# Patient Record
Sex: Male | Born: 1945 | Race: White | Hispanic: No | Smoking: Former smoker
Health system: Southern US, Community
[De-identification: ages and names within clinical notes are randomized; demographics above are authoritative.]

## PROBLEM LIST (undated history)

## (undated) DIAGNOSIS — B019 Varicella without complication: Secondary | ICD-10-CM

## (undated) DIAGNOSIS — M1990 Unspecified osteoarthritis, unspecified site: Secondary | ICD-10-CM

## (undated) DIAGNOSIS — Z87442 Personal history of urinary calculi: Secondary | ICD-10-CM

## (undated) DIAGNOSIS — E785 Hyperlipidemia, unspecified: Secondary | ICD-10-CM

## (undated) DIAGNOSIS — I2510 Atherosclerotic heart disease of native coronary artery without angina pectoris: Secondary | ICD-10-CM

## (undated) DIAGNOSIS — Z860100 Personal history of colon polyps, unspecified: Secondary | ICD-10-CM

## (undated) DIAGNOSIS — R519 Headache, unspecified: Secondary | ICD-10-CM

## (undated) DIAGNOSIS — E119 Type 2 diabetes mellitus without complications: Secondary | ICD-10-CM

## (undated) DIAGNOSIS — R51 Headache: Secondary | ICD-10-CM

## (undated) DIAGNOSIS — I519 Heart disease, unspecified: Secondary | ICD-10-CM

## (undated) DIAGNOSIS — I10 Essential (primary) hypertension: Secondary | ICD-10-CM

## (undated) HISTORY — DX: Varicella without complication: B01.9

## (undated) HISTORY — DX: Hyperlipidemia, unspecified: E78.5

## (undated) HISTORY — DX: Heart disease, unspecified: I51.9

## (undated) HISTORY — DX: Personal history of colonic polyps: Z86.010

## (undated) HISTORY — DX: Essential (primary) hypertension: I10

## (undated) HISTORY — DX: Headache, unspecified: R51.9

## (undated) HISTORY — DX: Unspecified osteoarthritis, unspecified site: M19.90

## (undated) HISTORY — PX: TONSILLECTOMY: SUR1361

## (undated) HISTORY — PX: CYST REMOVAL HAND: SHX6279

## (undated) HISTORY — DX: Headache: R51

## (undated) HISTORY — DX: Personal history of urinary calculi: Z87.442

## (undated) HISTORY — DX: Personal history of colon polyps, unspecified: Z86.0100

---

## 2012-05-16 HISTORY — PX: CARDIAC SURGERY: SHX584

## 2013-03-26 ENCOUNTER — Encounter (HOSPITAL_COMMUNITY): Payer: Self-pay | Admitting: Emergency Medicine

## 2013-03-26 ENCOUNTER — Emergency Department (HOSPITAL_COMMUNITY)
Admission: EM | Admit: 2013-03-26 | Discharge: 2013-03-26 | Disposition: A | Discharge status: Home / Self Care | Payer: Medicare HMO | Attending: Emergency Medicine | Admitting: Emergency Medicine

## 2013-03-26 ENCOUNTER — Emergency Department (HOSPITAL_COMMUNITY): Payer: Medicare HMO

## 2013-03-26 DIAGNOSIS — R5383 Other fatigue: Secondary | ICD-10-CM | POA: Insufficient documentation

## 2013-03-26 DIAGNOSIS — R5381 Other malaise: Secondary | ICD-10-CM | POA: Insufficient documentation

## 2013-03-26 DIAGNOSIS — R55 Syncope and collapse: Secondary | ICD-10-CM | POA: Insufficient documentation

## 2013-03-26 DIAGNOSIS — Z87891 Personal history of nicotine dependence: Secondary | ICD-10-CM | POA: Insufficient documentation

## 2013-03-26 DIAGNOSIS — Z951 Presence of aortocoronary bypass graft: Secondary | ICD-10-CM | POA: Insufficient documentation

## 2013-03-26 DIAGNOSIS — R42 Dizziness and giddiness: Secondary | ICD-10-CM | POA: Insufficient documentation

## 2013-03-26 LAB — CBC
HCT: 45.1 % (ref 39.0–52.0)
Hemoglobin: 14.8 g/dL (ref 13.0–17.0)
MCH: 29.7 pg (ref 26.0–34.0)
MCHC: 32.8 g/dL (ref 30.0–36.0)
Platelets: 259 10*3/uL (ref 150–400)
RBC: 4.98 MIL/uL (ref 4.22–5.81)
RDW: 13.8 % (ref 11.5–15.5)
WBC: 8.5 10*3/uL (ref 4.0–10.5)

## 2013-03-26 LAB — URINALYSIS, ROUTINE W REFLEX MICROSCOPIC
Bilirubin Urine: NEGATIVE
Hgb urine dipstick: NEGATIVE
Nitrite: NEGATIVE
Protein, ur: NEGATIVE mg/dL
Specific Gravity, Urine: 1.009 (ref 1.005–1.030)
Urobilinogen, UA: 0.2 mg/dL (ref 0.0–1.0)
pH: 7 (ref 5.0–8.0)

## 2013-03-26 LAB — BASIC METABOLIC PANEL
BUN: 9 mg/dL (ref 6–23)
Calcium: 10.5 mg/dL (ref 8.4–10.5)
Chloride: 99 mEq/L (ref 96–112)
GFR calc Af Amer: >90 mL/min (ref >90)
GFR calc non Af Amer: 86 mL/min — ABNORMAL LOW (ref >90)
Glucose, Bld: 103 mg/dL — ABNORMAL HIGH (ref 70–99)
Potassium: 4.4 mEq/L (ref 3.5–5.1)
Sodium: 136 mEq/L (ref 135–145)

## 2013-03-26 LAB — POCT I-STAT TROPONIN I

## 2013-03-26 MED ORDER — SODIUM CHLORIDE 0.9 % IV BOLUS (SEPSIS)
500.0000 mL | Freq: Once | INTRAVENOUS | Status: AC
  Administered 2013-03-26: 500 mL via INTRAVENOUS

## 2013-03-26 NOTE — Progress Notes (Signed)
   CARE MANAGEMENT ED NOTE 03/26/2013  Patient:  Hector Miller, Hector Miller   Account Number:  [id number]  Date Initiated:  03/26/2013  Documentation initiated by:  Edd Arbour  Subjective/Objective Assessment:   68 [employer] hmo pt without pcp listed in EPIC     Subjective/Objective Assessment Detail:     Action/Plan:   spoke with pt who confirms he sees larry smith and is seen at [facility] Epic updated   Action/Plan Detail:   Anticipated DC Date:  03/26/2013     Status Recommendation to Physician:   Result of Recommendation:    Other ED Services  Consult Working Plan    DC Planning Services  Outpatient Services - Pt will follow up  Other  PCP issues    Choice offered to / List presented to:            Status of service:  Completed, signed off  ED Comments:   ED Comments Detail:

## 2013-03-26 NOTE — ED Notes (Addendum)
Patient reports that he was driving and began to feel dizzy and have weakness. Patient denies N/V/D or any chest pain. Patient reports that he has had a similr episode, but did not go tot he doctor. Patient states he is having dizziness at rest.

## 2013-03-26 NOTE — ED Notes (Signed)
Patient transported to X-ray 

## 2013-03-26 NOTE — ED Provider Notes (Signed)
CSN: [id number]     Arrival date & time 03/26/13  0901 History   First MD Initiated Contact with Patient 03/26/13 [phone]     Chief Complaint  Patient presents with  . Dizziness  . Weakness   (Consider location/radiation/quality/duration/timing/severity/associated sxs/prior Treatment) HPI Comments: States he is "not feeling good" and some dizziness that began this morning. Denies any recent illness, antibiotic use. Denies any recent medication or diet changes. He did take some indomethacin for some doubt as he believes his foot is having a gout flareup at this time. Took some omeprazole for indigestion this morning.  Patient is a 67 y.o. male presenting with weakness. The history is provided by the patient.  Weakness This is a recurrent problem. The current episode started 3 to 5 hours ago. The problem occurs constantly. The problem has been gradually improving. Pertinent negatives include no chest pain, no abdominal pain and no shortness of breath. Nothing aggravates the symptoms. Nothing relieves the symptoms. He has tried nothing for the symptoms.    History reviewed. No pertinent past medical history. Past Surgical History  Procedure Laterality Date  . Cardiac surgery    . Tonsillectomy     Family History  Problem Relation Age of Onset  . Cancer Mother   . Cancer Father   . Heart failure Father   . Stroke Father    History  Substance Use Topics  . Smoking status: Former Smoker    Types: Cigarettes, Cigars, Pipe    Quit date: 03/26/1970  . Smokeless tobacco: Former Neurosurgeon  . Alcohol Use: Yes     Comment: daily-5-6 shots of liquor    Review of Systems  Constitutional: Negative for fever and chills.  Respiratory: Negative for cough and shortness of breath.   Cardiovascular: Negative for chest pain, palpitations and leg swelling.  Gastrointestinal: Negative for nausea, vomiting and abdominal pain.  Neurological: Positive for syncope (near-syncope) and weakness.  All other  systems reviewed and are negative.    Allergies  Review of patient's allergies indicates not on file.  Home Medications  No current outpatient prescriptions on file. BP 168/89  Pulse 87  Temp(Src) 97.4 F (36.3 C) (Oral)  Resp 20  SpO2 97% Physical Exam  Nursing note and vitals reviewed. Constitutional: He is oriented to person, place, and time. He appears well-developed and well-nourished. No distress.  HENT:  Head: Normocephalic and atraumatic.  Right Ear: Tympanic membrane normal.  Left Ear: Tympanic membrane normal.  Mouth/Throat: No oropharyngeal exudate.  Eyes: EOM are normal. Pupils are equal, round, and reactive to light.  Neck: Normal range of motion. Neck supple.  Cardiovascular: Normal rate and regular rhythm.  Exam reveals no friction rub.   No murmur heard. Pulmonary/Chest: Effort normal and breath sounds normal. No respiratory distress. He has no wheezes. He has no rales.  Abdominal: He exhibits no distension. There is no tenderness. There is no rebound.  Musculoskeletal: Normal range of motion. He exhibits no edema.  Neurological: He is alert and oriented to person, place, and time. No cranial nerve deficit. He exhibits normal muscle tone. Coordination and gait normal. GCS eye subscore is 4. GCS verbal subscore is 5. GCS motor subscore is 6.  Skin: He is not diaphoretic.    ED Course  Procedures (including critical care time) Labs Review Labs Reviewed  CBC  BASIC METABOLIC PANEL  URINALYSIS, ROUTINE W REFLEX MICROSCOPIC  LACTIC ACID, PLASMA   Imaging Review Dg Chest 2 View  03/26/2013   CLINICAL DATA:  Dizziness.  EXAM: CHEST  2 VIEW  COMPARISON:  None.  FINDINGS: Heart size and mediastinal contours are within normal limits. Both lungs are clear. Visualized skeletal structures are unremarkable. The patient is status post CABG.  IMPRESSION: No acute disease.   Electronically Signed   By: Drusilla Kanner M.D.   On: 03/26/2013 10:32    EKG Interpretation      Ventricular Rate:  88 PR Interval:  226 QRS Duration: 83 QT Interval:  362 QTC Calculation: 438 R Axis:   63 Text Interpretation:  Sinus rhythm Prolonged PR interval Probable left atrial enlargement Low voltage, precordial leads No previous EKG            MDM   1. Dizziness    67 year old male presents with dizziness. States began this morning. His been constant, however he states it is improving at this time. No alleviating or exacerbating factors. He states he has some associated presyncope. He had no true syncopal events. When asked if he had this before coming started as he had a cardiac bypass, however he did not specifically say that he had these symptoms related to his heart. He denies any chest pain, shortness of breath, fever, cough. Denies any nausea, vomiting, diarrhea, abdominal pain. ECT was just doesn't feel well. Will do broad-based workup including troponin, EKG, chest x-ray, urine, basic labs. He is not ataxic he is not having neurologic symptoms. I do not feel like this is consistent with a central dizziness as his gait is normal and he has no nausea or vomiting. He has normal cranial nerves and has no vision complaints. Labs normal here. EKG normal here. Serial troponins normal. I do not feel his symptoms are c/w a stroke. Patient states he is feeling better on re-exam and would like to go home. I feel this is reasonable at this time. Instructed to f/u with his PCP as soon as he returns home to [place].   Dagmar Hait, MD 03/26/13 [phone]

## 2014-10-19 DIAGNOSIS — Z7982 Long term (current) use of aspirin: Secondary | ICD-10-CM | POA: Insufficient documentation

## 2014-10-19 DIAGNOSIS — Z79899 Other long term (current) drug therapy: Secondary | ICD-10-CM | POA: Diagnosis not present

## 2014-10-19 DIAGNOSIS — M25472 Effusion, left ankle: Secondary | ICD-10-CM | POA: Diagnosis not present

## 2014-10-19 DIAGNOSIS — M25562 Pain in left knee: Secondary | ICD-10-CM | POA: Diagnosis present

## 2014-10-19 DIAGNOSIS — Z87891 Personal history of nicotine dependence: Secondary | ICD-10-CM | POA: Insufficient documentation

## 2014-10-19 NOTE — ED Notes (Signed)
Patient reports left knee pain since Wednesday.  Patient denies any type of injury.

## 2014-10-20 ENCOUNTER — Emergency Department
Admission: EM | Admit: 2014-10-20 | Discharge: 2014-10-20 | Disposition: A | Discharge status: Home / Self Care | Payer: Medicare PPO | Attending: Emergency Medicine | Admitting: Emergency Medicine

## 2014-10-20 ENCOUNTER — Emergency Department: Payer: Medicare PPO

## 2014-10-20 DIAGNOSIS — M25562 Pain in left knee: Secondary | ICD-10-CM

## 2014-10-20 MED ORDER — COLCHICINE 0.6 MG PO TABS
0.6000 mg | ORAL_TABLET | Freq: Once | ORAL | Status: AC
  Administered 2014-10-20: 0.6 mg via ORAL

## 2014-10-20 MED ORDER — COLCHICINE 0.6 MG PO TABS
ORAL_TABLET | ORAL | Status: AC
  Administered 2014-10-20: 0.6 mg via ORAL
  Filled 2014-10-20: qty 1

## 2014-10-20 NOTE — ED Provider Notes (Signed)
[facility] Emergency Department Provider Note  ____________________________________________  Time seen: 2:15 AM  I have reviewed the triage vital signs and the nursing notes.   HISTORY  Chief Complaint Knee Pain    HPI Hector Miller is a 69 y.o. male who complains of left knee pain for 5 days. He also reports mild swelling of the left ankle. He has a history of gout which feels similar. He has not tried anything for the pain at home yet. No history of DVT. No chest pain or shortness of breath, fever chills nausea vomiting diarrhea dizziness lightheadedness fatigue or syncope. No recent trauma slipped or falls. The pain is sharp and worse with joint movement, and worse with palpation of the joint line laterally.     No past medical history on file. Gout GERD There are no active problems to display for this patient.   Past Surgical History  Procedure Laterality Date  . Cardiac surgery    . Tonsillectomy      Current Outpatient Rx  Name  Route  Sig  Dispense  Refill  . aspirin 81 MG tablet   Oral   Take 81 mg by mouth daily.         . cholecalciferol (VITAMIN D) 1000 UNITS tablet   Oral   Take 1,000 Units by mouth daily.         . fish oil-omega-3 fatty acids 1000 MG capsule   Oral   Take 2 g by mouth daily.         . indomethacin (INDOCIN) 25 MG capsule   Oral   Take 25 mg by mouth 2 (two) times daily with a meal.         . metoprolol succinate (TOPROL-XL) 25 MG 24 hr tablet   Oral   Take 12.5 mg by mouth daily.         Marland Kitchen. omeprazole (PRILOSEC) 20 MG capsule   Oral   Take 40 mg by mouth 2 (two) times daily.           Allergies Review of patient's allergies indicates no known allergies.  Family History  Problem Relation Age of Onset  . Cancer Mother   . Cancer Father   . Heart failure Father   . Stroke Father     Social History History  Substance Use Topics  . Smoking status: Former Smoker    Types:  Cigarettes, Cigars, Pipe    Quit date: 03/26/1970  . Smokeless tobacco: Former NeurosurgeonUser  . Alcohol Use: Yes     Comment: daily-5-6 shots of liquor    Review of Systems  Constitutional: No fever or chills. No weight changes Eyes:No blurry vision or double vision.  ENT: No sore throat. Cardiovascular: No chest pain. Respiratory: No dyspnea or cough. Gastrointestinal: Negative for abdominal pain, vomiting and diarrhea.  No BRBPR or melena. Genitourinary: Negative for dysuria, urinary retention, bloody urine, or difficulty urinating. Musculoskeletal: Left knee pain Skin: Negative for rash. Neurological: Negative for headaches, focal weakness or numbness. Psychiatric:No anxiety or depression.   Endocrine:No hot/cold intolerance, changes in energy, or sleep difficulty.  10-point ROS otherwise negative.  ____________________________________________   PHYSICAL EXAM:  VITAL SIGNS: ED Triage Vitals  Enc Vitals Group     BP 10/19/14 2359 139/81 mmHg     Pulse Rate 10/19/14 2359 69     Resp 10/19/14 2359 20     Temp 10/19/14 2359 98.4 F (36.9 C)     Temp Source 10/19/14 2359 Oral  SpO2 10/19/14 2359 96 %     Weight 10/20/14 0001 235 lb (106.595 kg)     Height 10/19/14 2359 6' (1.829 m)     Head Cir --      Peak Flow --      Pain Score 10/20/14 0001 10     Pain Loc --      Pain Edu? --      Excl. in GC? --      Constitutional: Alert and oriented. Well appearing and in no distress. Eyes: No scleral icterus. No conjunctival pallor. PERRL. EOMI ENT   Head: Normocephalic and atraumatic. Musculoskeletal: Focal point tenderness at the left lateral joint line. Ligaments are all stable. There is no joint effusion. There is no erythema induration drainage or warmth. There is trace edema of the left lower extremity with normal peripheral pulses. There is no calf tenderness negative Homans sign.  Neurologic:   Normal speech and language.  CN 2-10 normal. Motor grossly  intact. No pronator drift.  Normal gait. No gross focal neurologic deficits are appreciated.  Skin:  Skin is warm, dry and intact. No rash noted.  No petechiae, purpura, or bullae. Psychiatric: Mood and affect are normal. Speech and behavior are normal. Patient exhibits appropriate insight and judgment.  ____________________________________________    LABS (pertinent positives/negatives) (all labs ordered are listed, but only abnormal results are displayed) Labs Reviewed - No data to display ____________________________________________   EKG    ____________________________________________    RADIOLOGY  X-ray left knee unremarkable  ____________________________________________   PROCEDURES  ____________________________________________   INITIAL IMPRESSION / ASSESSMENT AND PLAN / ED COURSE  Pertinent labs & imaging results that were available during my care of the patient were reviewed by me and considered in my medical decision making (see chart for details).  Patient presents with left knee pain likely arthritis in nature. May be related to his history of gout. We'll give him a trial of culture seen and send him home with instructions to take NSAIDs. No evidence of DVT cellulitis next testing fasciitis or abscess. No septic arthritis.  ____________________________________________   FINAL CLINICAL IMPRESSION(S) / ED DIAGNOSES  Final diagnoses:  Left knee pain      Sharman Cheek, MD 10/20/14 [phone]

## 2014-10-20 NOTE — Discharge Instructions (Signed)
Arthritis, Nonspecific °Arthritis is inflammation of a joint. This usually means pain, redness, warmth or swelling are present. One or more joints may be involved. There are a number of types of arthritis. Your caregiver may not be able to tell what type of arthritis you have right away. °CAUSES  °The most common cause of arthritis is the wear and tear on the joint (osteoarthritis). This causes damage to the cartilage, which can break down over time. The knees, hips, back and neck are most often affected by this type of arthritis. °Other types of arthritis and common causes of joint pain include: °· Sprains and other injuries near the joint. Sometimes minor sprains and injuries cause pain and swelling that develop hours later. °· Rheumatoid arthritis. This affects hands, feet and knees. It usually affects both sides of your body at the same time. It is often associated with chronic ailments, fever, weight loss and general weakness. °· Crystal arthritis. Gout and pseudo gout can cause occasional acute severe pain, redness and swelling in the foot, ankle, or knee. °· Infectious arthritis. Bacteria can get into a joint through a break in overlying skin. This can cause infection of the joint. Bacteria and viruses can also spread through the blood and affect your joints. °· Drug, infectious and allergy reactions. Sometimes joints can become mildly painful and slightly swollen with these types of illnesses. °SYMPTOMS  °· Pain is the main symptom. °· Your joint or joints can also be red, swollen and warm or hot to the touch. °· You may have a fever with certain types of arthritis, or even feel overall ill. °· The joint with arthritis will hurt with movement. Stiffness is present with some types of arthritis. °DIAGNOSIS  °Your caregiver will suspect arthritis based on your description of your symptoms and on your exam. Testing may be needed to find the type of arthritis: °· Blood and sometimes urine tests. °· X-ray tests  and sometimes CT or MRI scans. °· Removal of fluid from the joint (arthrocentesis) is done to check for bacteria, crystals or other causes. Your caregiver (or a specialist) will numb the area over the joint with a local anesthetic, and use a needle to remove joint fluid for examination. This procedure is only minimally uncomfortable. °· Even with these tests, your caregiver may not be able to tell what kind of arthritis you have. Consultation with a specialist (rheumatologist) may be helpful. °TREATMENT  °Your caregiver will discuss with you treatment specific to your type of arthritis. If the specific type cannot be determined, then the following general recommendations may apply. °Treatment of severe joint pain includes: °· Rest. °· Elevation. °· Anti-inflammatory medication (for example, ibuprofen) may be prescribed. Avoiding activities that cause increased pain. °· Only take over-the-counter or prescription medicines for pain and discomfort as recommended by your caregiver. °· Cold packs over an inflamed joint may be used for 10 to 15 minutes every hour. Hot packs sometimes feel better, but do not use overnight. Do not use hot packs if you are diabetic without your caregiver's permission. °· A cortisone shot into arthritic joints may help reduce pain and swelling. °· Any acute arthritis that gets worse over the next 1 to 2 days needs to be looked at to be sure there is no joint infection. °Long-term arthritis treatment involves modifying activities and lifestyle to reduce joint stress jarring. This can include weight loss. Also, exercise is needed to nourish the joint cartilage and remove waste. This helps keep the muscles   around the joint strong. °HOME CARE INSTRUCTIONS  °· Do not take aspirin to relieve pain if gout is suspected. This elevates uric acid levels. °· Only take over-the-counter or prescription medicines for pain, discomfort or fever as directed by your caregiver. °· Rest the joint as much as  possible. °· If your joint is swollen, keep it elevated. °· Use crutches if the painful joint is in your leg. °· Drinking plenty of fluids may help for certain types of arthritis. °· Follow your caregiver's dietary instructions. °· Try low-impact exercise such as: °¨ Swimming. °¨ Water aerobics. °¨ Biking. °¨ Walking. °· Morning stiffness is often relieved by a warm shower. °· Put your joints through regular range-of-motion. °SEEK MEDICAL CARE IF:  °· You do not feel better in 24 hours or are getting worse. °· You have side effects to medications, or are not getting better with treatment. °SEEK IMMEDIATE MEDICAL CARE IF:  °· You have a fever. °· You develop severe joint pain, swelling or redness. °· Many joints are involved and become painful and swollen. °· There is severe back pain and/or leg weakness. °· You have loss of bowel or bladder control. °Document Released: 06/09/2004 Document Revised: 07/25/2011 Document Reviewed: 06/25/2008 °[employer]® Patient Information ©2015 [employer]. This information is not intended to replace advice given to you by your health care provider. Make sure you discuss any questions you have with your health care provider. ° °

## 2014-10-20 NOTE — ED Notes (Signed)
Pt went to [employer] officer and states his pain has increased. RN brought pt back to room and reassessed. Xray ordered, pt offered tylenol, motrin, stated "they don't do nothing."

## 2014-11-28 ENCOUNTER — Ambulatory Visit: Payer: Medicare HMO | Admitting: Internal Medicine

## 2014-12-02 ENCOUNTER — Encounter (INDEPENDENT_AMBULATORY_CARE_PROVIDER_SITE_OTHER): Payer: Self-pay

## 2014-12-02 ENCOUNTER — Ambulatory Visit (INDEPENDENT_AMBULATORY_CARE_PROVIDER_SITE_OTHER): Payer: Medicare PPO | Admitting: Internal Medicine

## 2014-12-02 ENCOUNTER — Encounter: Payer: Self-pay | Admitting: Internal Medicine

## 2014-12-02 VITALS — BP 130/82 | HR 68 | Temp 98.3°F | Ht 72.25 in | Wt 240.0 lb

## 2014-12-02 DIAGNOSIS — I10 Essential (primary) hypertension: Secondary | ICD-10-CM | POA: Diagnosis not present

## 2014-12-02 DIAGNOSIS — E785 Hyperlipidemia, unspecified: Secondary | ICD-10-CM

## 2014-12-02 DIAGNOSIS — M479 Spondylosis, unspecified: Secondary | ICD-10-CM

## 2014-12-02 DIAGNOSIS — K219 Gastro-esophageal reflux disease without esophagitis: Secondary | ICD-10-CM | POA: Diagnosis not present

## 2014-12-02 DIAGNOSIS — M109 Gout, unspecified: Secondary | ICD-10-CM | POA: Diagnosis not present

## 2014-12-02 DIAGNOSIS — R51 Headache: Secondary | ICD-10-CM

## 2014-12-02 DIAGNOSIS — R519 Headache, unspecified: Secondary | ICD-10-CM | POA: Insufficient documentation

## 2014-12-02 DIAGNOSIS — N400 Enlarged prostate without lower urinary tract symptoms: Secondary | ICD-10-CM

## 2014-12-02 MED ORDER — PANTOPRAZOLE SODIUM 40 MG PO TBEC: 40.0000 mg | DELAYED_RELEASE_TABLET | Freq: Every day | ORAL | Status: DC

## 2014-12-02 NOTE — Assessment & Plan Note (Signed)
I think these are tension Advised him to keep a headache diary and bring back to his next visit Consider referral to neurology

## 2014-12-02 NOTE — Progress Notes (Signed)
Pre visit review using our clinic review tool, if applicable. No additional management support is needed unless otherwise documented below in the visit note. 

## 2014-12-02 NOTE — Assessment & Plan Note (Signed)
Discussed foods to avoid, that increase your risk for gout Will check CMET at next visit Continue Allopurinol at this time

## 2014-12-02 NOTE — Assessment & Plan Note (Signed)
Well controlled on current meds Will request previous records and ECG from PCP Will check CBC and CMET and next visit Consider cutting back on his Lasix at his next visit Will continue current meds at this time

## 2014-12-02 NOTE — Assessment & Plan Note (Signed)
Voids fine on Flomax

## 2014-12-02 NOTE — Progress Notes (Signed)
HPI  Pt presents to the clinic today to establish care and for management of the conditions listed below. He has been being seen at the [facility] in [place], [place] and [place], but reports he has never had a regular PCP.  Gout: His last gout attack was 1 month ago in his left knee. He takes Allopurinol daily.  HLD: S/P CABG and valve replacement in 2014 at [facility]. He takes Lipitor as prescribed. He also takes a ASA daily. He denies myalgias. He denies chest pain, chest tightness or shortness of breath.  HTN: He takes Lisinopril, Toprol and Lasix for his BP. He reports he is not really sure why he takes Lasix. He has no previous history of swelling or heart failure that he is aware of.  GERD: He takes Prilosec daily. He thinks it helps his reflux but he reports he does have occassional nausea.  BPH: He urinates well on Flomax.  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 He takes Aleve as needed.  He is concerned about frequent headaches and nausea. This has been going on for  "years". He has been evaluated by multiple doctors and "no one can figure out what is wrong with me". The headache is "all over". He cannot describe the pain but reports it just hurts. He denies sensitivity to light and sound. He does have nausea but denies vomiting. He reports he thinks he would feel better if he could just throw up. The nausea is intermittent. It seems to be worse after drinking coffee. He has had a colonoscopy in the past and many upper GI's.  He is having normal bowel movements. He denies blood in his stool.  Flu: never Tetanus: unsure of his last one Pneumovax: never Prevnar: never Zostovax: never Colonoscopy: in 2015 at [place] PSA screening: he thinks he gets this yearly Vision Screening: yearly  Dentist: as needed  Past Medical History  Diagnosis Date  . Arthritis   . Chicken pox   . Heart disease   . Frequent headaches   . Hypertension   . Hyperlipidemia   . History of colon polyps   . History of  kidney stones     Current Outpatient Prescriptions  Medication Sig Dispense Refill  . allopurinol (ZYLOPRIM) 300 MG tablet Take 300 mg by mouth daily.    Marland Kitchen aspirin 325 MG tablet Take 325 mg by mouth at bedtime and may repeat dose one time if needed.    Marland Kitchen atorvastatin (LIPITOR) 80 MG tablet Take 80 mg by mouth daily.    . Cholecalciferol (VITAMIN D3) 2000 UNITS TABS Take 1 tablet by mouth daily.    . furosemide (LASIX) 20 MG tablet Take 20 mg by mouth.    Marland Kitchen lisinopril (PRINIVIL,ZESTRIL) 20 MG tablet Take 20 mg by mouth daily.    . metoprolol succinate (TOPROL-XL) 25 MG 24 hr tablet Take 25 mg by mouth daily.     . Omega-3 350 MG CPDR Take 1 capsule by mouth 2 (two) times daily.    Marland Kitchen omeprazole (PRILOSEC) 20 MG capsule Take 40 mg by mouth 2 (two) times daily.    . tamsulosin (FLOMAX) 0.4 MG CAPS capsule Take 0.4 mg by mouth daily after supper.     No current facility-administered medications for this visit.    No Known Allergies  Family History  Problem Relation Age of Onset  . Cancer Mother     Breast  . Parkinson's disease Mother   . Heart failure Father   . Cancer Father  Stomach  . Stroke Father   . Hypertension Father     History   Social History  . Marital Status: Divorced    Spouse Name: N/A  . Number of Children: N/A  . Years of Education: N/A   Occupational History  . Not on file.   Social History Main Topics  . Smoking status: Former Smoker    Types: Cigarettes, Cigars, Pipe    Quit date: 03/26/1970  . Smokeless tobacco: Former NeurosurgeonUser  . Alcohol Use: 0.0 oz/week    0 Standard drinks or equivalent per week     Comment: daily-5-6 shots of liquor  . Drug Use: No  . Sexual Activity: Not on file   Other Topics Concern  . Not on file   Social History Narrative    ROS:  Constitutional: Pt reports headaches. Denies fever, malaise, fatigue, or abrupt weight changes.  HEENT: Denies eye pain, eye redness, ear pain, ringing in the ears, wax buildup, runny  nose, nasal congestion, bloody nose, or sore throat. Respiratory: Denies difficulty breathing, shortness of breath, cough or sputum production.   Cardiovascular: Denies chest pain, chest tightness, palpitations or swelling in the hands or feet.  Gastrointestinal: Pt reports nausea. Denies abdominal pain, bloating, constipation, diarrhea or blood in the stool.  GU: Denies frequency, urgency, pain with urination, blood in urine, odor or discharge. Musculoskeletal: Pt reports back pain. Denies decrease in range of motion, difficulty with gait, muscle pain or joint swelling.  Skin: Denies redness, rashes, lesions or ulcercations.  Neurological: Denies dizziness, difficulty with memory, difficulty with speech or problems with balance and coordination.  Psych: Denies anxiety, depression, SI/HI.  No other specific complaints in a complete review of systems (except as listed in HPI above).  PE:  BP 130/82 mmHg  Pulse 68  Temp(Src) 98.3 F (36.8 C) (Oral)  Ht 6' 0.25" (1.835 m)  Wt 240 lb (108.863 kg)  BMI 32.33 kg/m2  SpO2 97% Wt Readings from Last 3 Encounters:  12/02/14 240 lb (108.863 kg)  10/20/14 235 lb (106.595 kg)    General: Appears his stated age, obese in NAD. HEENT: Head: normal shape and size; Eyes: sclera white, no icterus, conjunctiva pink, PERRLA and EOMs intact;  Neck: Neck supple, trachea midline. No masses, lumps or thyromegaly present.  Cardiovascular: Normal rate and rhythm. S1,S2 noted.  No murmur, rubs or gallops noted. Trace BLE edema. No carotid bruits noted. Pulmonary/Chest: Normal effort and positive vesicular breath sounds. No respiratory distress. No wheezes, rales or ronchi noted.  Abdomen: Soft and nontender. Normal bowel sounds, no bruits noted. No distention or masses noted. Liver, spleen and kidneys non palpable. Musculoskeletal: Decreased flexion of the spine. Normal extension and rotation of the spine. Mild pain with palpation over the lumbar spine. No  difficulty with gait.  Neurological: Alert and oriented.  Psychiatric: Mood and affect normal. Behavior is normal. Judgment and thought content normal.    BMET    Component Value Date/Time   NA 136 03/26/2013 1055   K 4.4 03/26/2013 1055   CL 99 03/26/2013 1055   CO2 25 03/26/2013 1055   GLUCOSE 103* 03/26/2013 1055   BUN 9 03/26/2013 1055   CREATININE 0.88 03/26/2013 1055   CALCIUM 10.5 03/26/2013 1055   GFRNONAA 86* 03/26/2013 1055   GFRAA >90 03/26/2013 1055    Lipid Panel  No results found for: CHOL, TRIG, HDL, CHOLHDL, VLDL, LDLCALC  CBC    Component Value Date/Time   WBC 8.5 03/26/2013 1055  RBC 4.98 03/26/2013 1055   HGB 14.8 03/26/2013 1055   HCT 45.1 03/26/2013 1055   PLT 259 03/26/2013 1055   MCV 90.6 03/26/2013 1055   MCH 29.7 03/26/2013 1055   MCHC 32.8 03/26/2013 1055   RDW 13.8 03/26/2013 1055    Hgb A1C No results found for: HGBA1C   Assessment and Plan:

## 2014-12-02 NOTE — Assessment & Plan Note (Signed)
I think this is not well controlled which is why he is having so much nausea Will stop Prilosec, and change to Protonix- RX printed Advised him to keep a diary of when he gets nauseated and wether it seems associated with food or not

## 2014-12-02 NOTE — Assessment & Plan Note (Signed)
Encouraged him to consume a low fat diet Will check CMET and Lipid profile at his next visit Continue Lipitor for now

## 2014-12-02 NOTE — Patient Instructions (Signed)

## 2014-12-02 NOTE — Assessment & Plan Note (Signed)
Continue Aleve prn

## 2015-05-17 HISTORY — PX: AORTIC VALVE REPAIR: SHX6306

## 2015-06-04 ENCOUNTER — Other Ambulatory Visit: Payer: Self-pay | Admitting: Internal Medicine

## 2015-10-18 ENCOUNTER — Other Ambulatory Visit: Payer: Self-pay | Admitting: Internal Medicine

## 2016-02-03 ENCOUNTER — Other Ambulatory Visit: Payer: Self-pay | Admitting: Internal Medicine

## 2016-02-23 ENCOUNTER — Other Ambulatory Visit: Payer: Self-pay | Admitting: Internal Medicine

## 2016-02-26 ENCOUNTER — Other Ambulatory Visit: Payer: Self-pay | Admitting: Internal Medicine

## 2016-02-27 ENCOUNTER — Other Ambulatory Visit: Payer: Self-pay | Admitting: Internal Medicine

## 2021-01-07 ENCOUNTER — Other Ambulatory Visit: Payer: Self-pay | Admitting: Ophthalmology

## 2021-01-07 ENCOUNTER — Other Ambulatory Visit
Admission: RE | Admit: 2021-01-07 | Discharge: 2021-01-07 | Disposition: A | Discharge status: Home / Self Care | Payer: Medicare Other | Source: Ambulatory Visit | Attending: Ophthalmology | Admitting: Ophthalmology

## 2021-01-07 DIAGNOSIS — G453 Amaurosis fugax: Secondary | ICD-10-CM | POA: Diagnosis present

## 2021-01-07 LAB — CBC
HCT: 46.9 % (ref 39.0–52.0)
Hemoglobin: 16.7 g/dL (ref 13.0–17.0)
MCH: 34.8 pg — ABNORMAL HIGH (ref 26.0–34.0)
MCHC: 35.6 g/dL (ref 30.0–36.0)
MCV: 97.7 fL (ref 80.0–100.0)
Platelets: 158 10*3/uL (ref 150–400)
RBC: 4.8 MIL/uL (ref 4.22–5.81)
RDW: 12.1 % (ref 11.5–15.5)
WBC: 7.7 10*3/uL (ref 4.0–10.5)
nRBC: 0 % (ref 0.0–0.2)

## 2021-01-07 LAB — SEDIMENTATION RATE: Sed Rate: 15 mm/hr (ref 0–20)

## 2021-01-07 LAB — DIGOXIN LEVEL: Digoxin Level: 2 ng/mL (ref 0.8–2.0)

## 2021-01-07 LAB — C-REACTIVE PROTEIN: CRP: 1.5 mg/dL — ABNORMAL HIGH (ref <1.0)

## 2021-01-21 ENCOUNTER — Other Ambulatory Visit: Payer: Self-pay

## 2021-01-21 ENCOUNTER — Ambulatory Visit
Admission: RE | Admit: 2021-01-21 | Discharge: 2021-01-21 | Disposition: A | Discharge status: Home / Self Care | Payer: Medicare Other | Source: Ambulatory Visit | Attending: Ophthalmology | Admitting: Ophthalmology

## 2021-01-21 DIAGNOSIS — G453 Amaurosis fugax: Secondary | ICD-10-CM | POA: Diagnosis present

## 2021-02-25 ENCOUNTER — Ambulatory Visit
Payer: No Typology Code available for payment source | Attending: Student in an Organized Health Care Education/Training Program | Admitting: Student in an Organized Health Care Education/Training Program

## 2021-02-25 ENCOUNTER — Encounter: Payer: Self-pay | Admitting: Student in an Organized Health Care Education/Training Program

## 2021-02-25 ENCOUNTER — Other Ambulatory Visit: Payer: Self-pay

## 2021-02-25 VITALS — BP 117/64 | HR 83 | Temp 97.1°F | Resp 16 | Ht 72.0 in | Wt 224.0 lb

## 2021-02-25 DIAGNOSIS — G8929 Other chronic pain: Secondary | ICD-10-CM | POA: Insufficient documentation

## 2021-02-25 DIAGNOSIS — M48062 Spinal stenosis, lumbar region with neurogenic claudication: Secondary | ICD-10-CM | POA: Diagnosis present

## 2021-02-25 DIAGNOSIS — M47816 Spondylosis without myelopathy or radiculopathy, lumbar region: Secondary | ICD-10-CM | POA: Insufficient documentation

## 2021-02-25 DIAGNOSIS — G894 Chronic pain syndrome: Secondary | ICD-10-CM | POA: Insufficient documentation

## 2021-02-25 DIAGNOSIS — M5416 Radiculopathy, lumbar region: Secondary | ICD-10-CM | POA: Insufficient documentation

## 2021-02-25 MED ORDER — PREGABALIN 25 MG PO CAPS: ORAL_CAPSULE | ORAL | 0 refills | Status: DC

## 2021-02-25 NOTE — Progress Notes (Signed)
Safety precautions to be maintained throughout the outpatient stay will include: orient to surroundings, keep bed in low position, maintain call bell within reach at all times, provide assistance with transfer out of bed and ambulation.  

## 2021-02-25 NOTE — Progress Notes (Signed)
Patient: Hector Miller  Service Category: E/M  Provider: Gillis Santa, MD  DOB: Aug 12, 1945  DOS: 02/25/2021  Referring Provider: Jearld Fenton, NP  MRN: [id number]  Setting: Ambulatory outpatient  PCP: Jearld Fenton, NP  Type: New Patient  Specialty: Interventional Pain Management    Location: Office  Delivery: Face-to-face     Primary Reason(s) for Visit: Encounter for initial evaluation of one or more chronic problems (new to examiner) potentially causing chronic pain, and posing a threat to normal musculoskeletal function. (Level of risk: High) CC: Back Pain (lower)  HPI  [name] is a 75 y.o. year old, male patient, who comes for the first time to our practice referred by Jearld Fenton, NP for our initial evaluation of his chronic pain. He has Gout; HLD (hyperlipidemia); HTN (hypertension); Gastroesophageal reflux disease without esophagitis; BPH (benign prostatic hyperplasia); Arthritis of back; Frequent headaches; Spinal stenosis, lumbar region, with neurogenic claudication; Lumbar facet arthropathy; Lumbar radiculopathy; and Chronic pain syndrome on their problem list. Today he comes in for evaluation of his Back Pain (lower)  Pain Assessment: Location: Left, Right Back Radiating: pain radiaties down both leg, down to his feet Onset: More than a month ago Duration: Chronic pain Quality: Stabbing, Shooting, Throbbing, Aching, Constant, Numbness Severity: 8 /10 (subjective, self-reported pain score)  Effect on ADL: limits my daily activites Timing: Constant Modifying factors: nothing BP: 117/64  HR: 83  Onset and Duration: Gradual and Date of onset: 54 years Cause of pain: Work related accident or event Severity: No change since onset Timing: Not influenced by the time of the day Aggravating Factors: Bending, Climbing, Kneeling, Lifiting, Motion, Prolonged sitting, Prolonged standing, Squatting, Stooping , Twisting, Walking, Walking uphill, Walking downhill, and  Working Alleviating Factors: Acupuncture, Cold packs, Hot packs, Lying down, Medications, Nerve blocks, Resting, Relaxation therapy, Warm showers or baths, and Chiropractic manipulations Associated Problems: Night-time cramps, Depression, Dizziness, Fatigue, Inability to concentrate, Nausea, Numbness, Tingling, Weakness, Pain that wakes patient up, and Pain that does not allow patient to sleep Quality of Pain: Aching, Agonizing, Constant, Cramping, Disabling, Distressing, Dreadful, Dull, Exhausting, Fearful, Feeling of constriction, Getting longer, Horrible, Nagging, Numb, Pressure-like, Pulsating, Sharp, Shooting, Stabbing, Tender, Uncomfortable, and Work related Previous Examinations or Tests: Bone scan, CT scan, MRI scan, X-rays, Neurological evaluation, Orthopedic evaluation, and Psychiatric evaluation Previous Treatments: Chiropractic manipulations, Epidural steroid injections, Physical Therapy, and Stretching exercises  [name] is a pleasant 75 year old male who presents with a chief complaint of low back pain with radiation into bilateral lower extremities in a dermatomal fashion secondary to chronic lumbar radicular pain.  He was previously being seen in [place] by pain specialist Dr. Truman Hayward. there he had lumbar epidural steroid injections done which were helpful.  He has recently moved to [facility] and is hoping to establish with pain management here.  He has tried gabapentin in the past with limited response.  He denies having tried Lyrica.  He does utilize alcohol and drinks scotch.  He does have ongoing dizziness.  He has a neurology referral in place.  Meds   Current Outpatient Medications:    allopurinol (ZYLOPRIM) 300 MG tablet, Take 300 mg by mouth daily., Disp: , Rfl:    Cholecalciferol (VITAMIN D3) 2000 UNITS TABS, Take 1 tablet by mouth daily., Disp: , Rfl:    finasteride (PROSCAR) 5 MG tablet, Take 5 mg by mouth daily., Disp: , Rfl:    folic acid (FOLVITE) 1 MG tablet, Take 1  mg by mouth daily., Disp: ,  Rfl:    furosemide (LASIX) 20 MG tablet, Take 20 mg by mouth., Disp: , Rfl:    losartan (COZAAR) 25 MG tablet, Take 25 mg by mouth daily., Disp: , Rfl:    meclizine (ANTIVERT) 25 MG tablet, Take 25 mg by mouth 3 (three) times daily as needed for dizziness., Disp: , Rfl:    mirtazapine (REMERON) 30 MG tablet, Take 30 mg by mouth at bedtime., Disp: , Rfl:    pantoprazole (PROTONIX) 40 MG tablet, Take 1 tablet (40 mg total) by mouth daily. MUST SCHEDULE ANNUAL EXAM FOR MORE REFILLS, Disp: 90 tablet, Rfl: 0   pregabalin (LYRICA) 25 MG capsule, Take 1 capsule (25 mg total) by mouth at bedtime for 20 days, THEN 2 capsules (50 mg total) at bedtime., Disp: 100 capsule, Rfl: 0   rosuvastatin (CRESTOR) 20 MG tablet, Take 20 mg by mouth daily., Disp: , Rfl:    thiamine (VITAMIN B-1) 100 MG tablet, Take 100 mg by mouth daily., Disp: , Rfl:   Imaging Review   Lumbar MRI 03/04/2020: L1-L2: Mild to moderate central canal stenosis, moderate bilateral foraminal stenosis L2-L3: Moderate canal stenosis, moderate to severe right and moderate left foraminal narrowing L3-L4: Mild to moderate central canal stenosis moderate bilateral foraminal narrowing L4-L5: Moderate to severe right foraminal narrowing and moderate left foraminal stenosis L5-S1 mild left foraminal narrowing  Knee-L DG 4 views: Results for orders placed during the hospital encounter of 10/20/14  DG Knee Complete 4 Views Left  Narrative CLINICAL DATA:  Pain for several days.  EXAM: LEFT KNEE - COMPLETE 4+ VIEW  COMPARISON:  None.  FINDINGS: There is no evidence of fracture, dislocation, or joint effusion. There is no evidence of arthropathy or other focal bone abnormality. Soft tissues are unremarkable.  IMPRESSION: Negative.   Electronically Signed By: Rolla Flatten M.D. On: 10/20/2014 02:10   Complexity Note: Imaging results reviewed. Results shared with Mr. Grealish, using Layman's terms.                          ROS  Cardiovascular: Heart trouble, Abnormal heart rhythm, Daily Aspirin intake, High blood pressure, Chest pain, Heart surgery, and Heart valve problems Pulmonary or Respiratory: Snoring  and Temporary stoppage of breathing during sleep Neurological: No reported neurological signs or symptoms such as seizures, abnormal skin sensations, urinary and/or fecal incontinence, being born with an abnormal open spine and/or a tethered spinal cord Psychological-Psychiatric: Anxiousness and Depressed Gastrointestinal: Reflux or heatburn Genitourinary: No reported renal or genitourinary signs or symptoms such as difficulty voiding or producing urine, peeing blood, non-functioning kidney, kidney stones, difficulty emptying the bladder, difficulty controlling the flow of urine, or chronic kidney disease Hematological: No reported hematological signs or symptoms such as prolonged bleeding, low or poor functioning platelets, bruising or bleeding easily, hereditary bleeding problems, low energy levels due to low hemoglobin or being anemic Endocrine: No reported endocrine signs or symptoms such as high or low blood sugar, rapid heart rate due to high thyroid levels, obesity or weight gain due to slow thyroid or thyroid disease Rheumatologic: No reported rheumatological signs and symptoms such as fatigue, joint pain, tenderness, swelling, redness, heat, stiffness, decreased range of motion, with or without associated rash Musculoskeletal: Negative for myasthenia gravis, muscular dystrophy, multiple sclerosis or malignant hyperthermia Work History: Quit going to work on his/her own  Allergies  Mr. Treptow has No Known Allergies.  Laboratory Chemistry Profile   Renal Lab Results  Component Value Date  BUN 9 03/26/2013   CREATININE 0.88 03/26/2013   GFRAA >90 03/26/2013   GFRNONAA 86 (L) 03/26/2013   PROTEINUR NEGATIVE 03/26/2013     Electrolytes Lab Results  Component Value Date   NA 136  03/26/2013   K 4.4 03/26/2013   CL 99 03/26/2013   CALCIUM 10.5 03/26/2013     Hepatic No results found for: AST, ALT, ALBUMIN, ALKPHOS, AMYLASE, LIPASE, AMMONIA   ID No results found for: LYMEIGGIGMAB, HIV, SARSCOV2NAA, STAPHAUREUS, MRSAPCR, HCVAB, PREGTESTUR, RMSFIGG, QFVRPH1IGG, QFVRPH2IGG, LYMEIGGIGMAB   Bone No results found for: VD25OH, ON629BM8UXL, KG4010UV2, ZD6644IH4, 25OHVITD1, 25OHVITD2, 25OHVITD3, TESTOFREE, TESTOSTERONE   Endocrine Lab Results  Component Value Date   GLUCOSE 103 (H) 03/26/2013   GLUCOSEU NEGATIVE 03/26/2013     Neuropathy No results found for: VITAMINB12, FOLATE, HGBA1C, HIV   CNS No results found for: COLORCSF, APPEARCSF, RBCCOUNTCSF, WBCCSF, POLYSCSF, LYMPHSCSF, EOSCSF, PROTEINCSF, GLUCCSF, JCVIRUS, CSFOLI, IGGCSF, LABACHR, ACETBL, LABACHR, ACETBL   Inflammation (CRP: Acute  ESR: Chronic) Lab Results  Component Value Date   CRP 1.5 (H) 01/07/2021   ESRSEDRATE 15 01/07/2021   LATICACIDVEN 2.3 (H) 03/26/2013     Rheumatology No results found for: RF, ANA, LABURIC, URICUR, LYMEIGGIGMAB, LYMEABIGMQN, HLAB27   Coagulation Lab Results  Component Value Date   PLT 158 01/07/2021     Cardiovascular Lab Results  Component Value Date   HGB 16.7 01/07/2021   HCT 46.9 01/07/2021     Screening No results found for: SARSCOV2NAA, COVIDSOURCE, STAPHAUREUS, MRSAPCR, HCVAB, HIV, PREGTESTUR   Cancer No results found for: CEA, CA125, LABCA2   Allergens No results found for: ALMOND, APPLE, ASPARAGUS, AVOCADO, BANANA, BARLEY, BASIL, BAYLEAF, GREENBEAN, LIMABEAN, WHITEBEAN, BEEFIGE, REDBEET, BLUEBERRY, BROCCOLI, CABBAGE, MELON, CARROT, CASEIN, CASHEWNUT, CAULIFLOWER, CELERY     Note: Lab results reviewed.  PFSH  Drug: Mr. Falco  reports no history of drug use. Alcohol:  reports current alcohol use. Tobacco:  reports that he quit smoking about 50 years ago. His smoking use included cigarettes, cigars, and pipe. He has quit using smokeless  tobacco. Medical:  has a past medical history of Arthritis, Chicken pox, Frequent headaches, Heart disease, History of colon polyps, History of kidney stones, Hyperlipidemia, and Hypertension. Family: family history includes Cancer in his father and mother; Heart failure in his father; Hypertension in his father; Parkinson's disease in his mother; Stroke in his father.  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   CYST REMOVAL HAND Left    TONSILLECTOMY     Active Ambulatory Problems    Diagnosis Date Noted   Gout 12/02/2014   HLD (hyperlipidemia) 12/02/2014   HTN (hypertension) 12/02/2014   Gastroesophageal reflux disease without esophagitis 12/02/2014   BPH (benign prostatic hyperplasia) 12/02/2014   Arthritis of back 12/02/2014   Frequent headaches 12/02/2014   Spinal stenosis, lumbar region, with neurogenic claudication 02/25/2021   Lumbar facet arthropathy 02/25/2021   Lumbar radiculopathy 02/25/2021   Chronic pain syndrome 02/25/2021   Resolved Ambulatory Problems    Diagnosis Date Noted   No Resolved Ambulatory Problems   Past Medical History:  Diagnosis Date   Arthritis    Chicken pox    Heart disease    History of colon polyps    History of kidney stones    Hyperlipidemia    Hypertension    Constitutional Exam  General appearance: Well nourished, well developed, and well hydrated. In no apparent acute distress Vitals:  02/25/21 1000  BP: 117/64  Pulse: 83  Resp: 16  Temp: (!) 97.1 F (36.2 C)  TempSrc: Temporal  SpO2: 98%  Weight: 224 lb (101.6 kg)  Height: 6' (1.829 m)   BMI Assessment: Estimated body mass index is 30.38 kg/m as calculated from the following:   Height as of this encounter: 6' (1.829 m).   Weight as of this encounter: 224 lb (101.6 kg).  BMI interpretation table: BMI level Category Range association with higher incidence of chronic pain  <18 kg/m2  Underweight   18.5-24.9 kg/m2 Ideal body weight   25-29.9 kg/m2 Overweight Increased incidence by 20%  30-34.9 kg/m2 Obese (Class I) Increased incidence by 68%  35-39.9 kg/m2 Severe obesity (Class II) Increased incidence by 136%  >40 kg/m2 Extreme obesity (Class III) Increased incidence by 254%   Patient's current BMI Ideal Body weight  Body mass index is 30.38 kg/m. Ideal body weight: 77.6 kg (171 lb 1.2 oz) Adjusted ideal body weight: 87.2 kg (192 lb 3.9 oz)   BMI Readings from Last 4 Encounters:  02/25/21 30.38 kg/m  12/02/14 32.32 kg/m  10/20/14 (P) 31.87 kg/m   Wt Readings from Last 4 Encounters:  02/25/21 224 lb (101.6 kg)  12/02/14 240 lb (108.9 kg)  10/20/14 235 lb (106.6 kg)    Psych/Mental status: Alert, oriented x 3 (person, place, & time)       Eyes: PERLA Respiratory: No evidence of acute respiratory distress  Lumbar Spine Area Exam  Skin & Axial Inspection: No masses, redness, or swelling Alignment: Symmetrical Functional ROM: Pain restricted ROM       Stability: No instability detected Muscle Tone/Strength: Functionally intact. No obvious neuro-muscular anomalies detected. Sensory (Neurological): Dermatomal pain pattern Bilateral straight straight leg raise test, positive Gait & Posture Assessment  Ambulation: Unassisted Gait: Relatively normal for age and body habitus Posture: WNL  Lower Extremity Exam    Side: Right lower extremity  Side: Left lower extremity  Stability: No instability observed          Stability: No instability observed          Skin & Extremity Inspection: Skin color, temperature, and hair growth are WNL. No peripheral edema or cyanosis. No masses, redness, swelling, asymmetry, or associated skin lesions. No contractures.  Skin & Extremity Inspection: Skin color, temperature, and hair growth are WNL. No peripheral edema or cyanosis. No masses, redness, swelling, asymmetry, or associated skin lesions. No contractures.  Functional ROM:  Unrestricted ROM                  Functional ROM: Unrestricted ROM                  Muscle Tone/Strength: Functionally intact. No obvious neuro-muscular anomalies detected.  Muscle Tone/Strength: Functionally intact. No obvious neuro-muscular anomalies detected.  Sensory (Neurological): Dermatomal pain pattern        Sensory (Neurological): Dermatomal pain pattern        DTR: Patellar: deferred today Achilles: deferred today Plantar: deferred today  DTR: Patellar: deferred today Achilles: deferred today Plantar: deferred today  Palpation: No palpable anomalies  Palpation: No palpable anomalies    Assessment  Primary Diagnosis & Pertinent Problem List: The primary encounter diagnosis was Spinal stenosis, lumbar region, with neurogenic claudication. Diagnoses of Lumbar spondylosis, Lumbar facet arthropathy, Chronic radicular lumbar pain, Lumbar radiculopathy, and Chronic pain syndrome were also pertinent to this visit.  Visit Diagnosis (New problems to examiner): 1. Spinal stenosis, lumbar region, with neurogenic claudication  2. Lumbar spondylosis   3. Lumbar facet arthropathy   4. Chronic radicular lumbar pain   5. Lumbar radiculopathy   6. Chronic pain syndrome    Plan of Care (Initial workup plan)  General Recommendations: The pain condition that the patient suffers from is best treated with a multidisciplinary approach that involves an increase in physical activity to prevent de-conditioning and worsening of the pain cycle, as well as psychological counseling (formal and/or informal) to address the co-morbid psychological affects of pain. Treatment will often involve judicious use of pain medications and interventional procedures to decrease the pain, allowing the patient to participate in the physical activity that will ultimately produce long-lasting pain reductions. The goal of the multidisciplinary approach is to return the patient to a higher level of overall function and to  restore their ability to perform activities of daily living.   Pharmacotherapy (current): Medications ordered:  Meds ordered this encounter  Medications   pregabalin (LYRICA) 25 MG capsule    Sig: Take 1 capsule (25 mg total) by mouth at bedtime for 20 days, THEN 2 capsules (50 mg total) at bedtime.    Dispense:  100 capsule    Refill:  0    Medications administered during this visit: Lavonna Monarch. Bischof had no medications administered during this visit.   Pharmacological management options:  Opioid Analgesics: N/A, patient drinks scotch regularly  Membrane stabilizer:  Failed gabapentin.  Trial of Lyrica as above.  Future considerations include Cymbalta  Muscle relaxant: To be determined at a later time  NSAID: To be determined at a later time  Other analgesic(s): To be determined at a later time   Interventional management options: Mr. Kirk was informed that there is no guarantee that he would be a candidate for interventional therapies. The decision will be based on the results of diagnostic studies, as well as Mr. Prats risk profile.  Procedure(s) under consideration:  Lumbar epidural steroid injection, interlaminar versus transforaminal    Provider-requested follow-up: Return in about 6 weeks (around 04/08/2021) for Medication Management, in person.  I spent a total of 60 minutes reviewing chart data, face-to-face evaluation with the patient, counseling and coordination of care as detailed above.   Future Appointments  Date Time Provider [place]  04/01/2021 10:40 AM Gillis Santa, MD [facility] None    Note by: Gillis Santa, MD Date: 02/25/2021; Time: 3:53 PM

## 2021-04-01 ENCOUNTER — Encounter
Payer: No Typology Code available for payment source | Admitting: Student in an Organized Health Care Education/Training Program

## 2021-05-13 ENCOUNTER — Emergency Department: Payer: No Typology Code available for payment source

## 2021-05-13 ENCOUNTER — Other Ambulatory Visit: Payer: Self-pay

## 2021-05-13 DIAGNOSIS — Z820 Family history of epilepsy and other diseases of the nervous system: Secondary | ICD-10-CM

## 2021-05-13 DIAGNOSIS — F1020 Alcohol dependence, uncomplicated: Secondary | ICD-10-CM | POA: Diagnosis present

## 2021-05-13 DIAGNOSIS — I440 Atrioventricular block, first degree: Secondary | ICD-10-CM | POA: Diagnosis present

## 2021-05-13 DIAGNOSIS — M109 Gout, unspecified: Secondary | ICD-10-CM | POA: Diagnosis present

## 2021-05-13 DIAGNOSIS — U071 COVID-19: Secondary | ICD-10-CM | POA: Diagnosis not present

## 2021-05-13 DIAGNOSIS — M1990 Unspecified osteoarthritis, unspecified site: Secondary | ICD-10-CM | POA: Diagnosis present

## 2021-05-13 DIAGNOSIS — N400 Enlarged prostate without lower urinary tract symptoms: Secondary | ICD-10-CM | POA: Diagnosis present

## 2021-05-13 DIAGNOSIS — Z951 Presence of aortocoronary bypass graft: Secondary | ICD-10-CM

## 2021-05-13 DIAGNOSIS — Z803 Family history of malignant neoplasm of breast: Secondary | ICD-10-CM

## 2021-05-13 DIAGNOSIS — Z823 Family history of stroke: Secondary | ICD-10-CM

## 2021-05-13 DIAGNOSIS — Z7984 Long term (current) use of oral hypoglycemic drugs: Secondary | ICD-10-CM

## 2021-05-13 DIAGNOSIS — Z8249 Family history of ischemic heart disease and other diseases of the circulatory system: Secondary | ICD-10-CM

## 2021-05-13 DIAGNOSIS — Z87891 Personal history of nicotine dependence: Secondary | ICD-10-CM

## 2021-05-13 DIAGNOSIS — Z79899 Other long term (current) drug therapy: Secondary | ICD-10-CM

## 2021-05-13 DIAGNOSIS — R531 Weakness: Secondary | ICD-10-CM | POA: Diagnosis not present

## 2021-05-13 DIAGNOSIS — D6959 Other secondary thrombocytopenia: Secondary | ICD-10-CM | POA: Diagnosis present

## 2021-05-13 DIAGNOSIS — Z66 Do not resuscitate: Secondary | ICD-10-CM | POA: Diagnosis not present

## 2021-05-13 DIAGNOSIS — M48062 Spinal stenosis, lumbar region with neurogenic claudication: Secondary | ICD-10-CM | POA: Diagnosis present

## 2021-05-13 DIAGNOSIS — E1165 Type 2 diabetes mellitus with hyperglycemia: Secondary | ICD-10-CM | POA: Diagnosis not present

## 2021-05-13 DIAGNOSIS — I10 Essential (primary) hypertension: Secondary | ICD-10-CM | POA: Diagnosis present

## 2021-05-13 DIAGNOSIS — I2510 Atherosclerotic heart disease of native coronary artery without angina pectoris: Secondary | ICD-10-CM | POA: Diagnosis present

## 2021-05-13 DIAGNOSIS — E785 Hyperlipidemia, unspecified: Secondary | ICD-10-CM | POA: Diagnosis present

## 2021-05-13 DIAGNOSIS — E871 Hypo-osmolality and hyponatremia: Secondary | ICD-10-CM | POA: Diagnosis not present

## 2021-05-13 DIAGNOSIS — F39 Unspecified mood [affective] disorder: Secondary | ICD-10-CM | POA: Diagnosis present

## 2021-05-13 DIAGNOSIS — Z953 Presence of xenogenic heart valve: Secondary | ICD-10-CM

## 2021-05-13 DIAGNOSIS — E1142 Type 2 diabetes mellitus with diabetic polyneuropathy: Secondary | ICD-10-CM | POA: Diagnosis present

## 2021-05-13 DIAGNOSIS — Z800 Family history of malignant neoplasm of digestive organs: Secondary | ICD-10-CM

## 2021-05-13 LAB — BASIC METABOLIC PANEL
Anion gap: 10 (ref 5–15)
BUN: 14 mg/dL (ref 8–23)
CO2: 23 mmol/L (ref 22–32)
Calcium: 9.1 mg/dL (ref 8.9–10.3)
Chloride: 97 mmol/L — ABNORMAL LOW (ref 98–111)
Creatinine, Ser: 0.94 mg/dL (ref 0.61–1.24)
GFR, Estimated: >60 mL/min (ref >60)
Glucose, Bld: 250 mg/dL — ABNORMAL HIGH (ref 70–99)
Potassium: 4 mmol/L (ref 3.5–5.1)
Sodium: 130 mmol/L — ABNORMAL LOW (ref 135–145)

## 2021-05-13 LAB — RESP PANEL BY RT-PCR (FLU A&B, COVID) ARPGX2
Influenza A by PCR: NEGATIVE
Influenza B by PCR: NEGATIVE
SARS Coronavirus 2 by RT PCR: POSITIVE — AB

## 2021-05-13 LAB — CBC
HCT: 47.5 % (ref 39.0–52.0)
Hemoglobin: 16.5 g/dL (ref 13.0–17.0)
MCH: 34.2 pg — ABNORMAL HIGH (ref 26.0–34.0)
MCHC: 34.7 g/dL (ref 30.0–36.0)
MCV: 98.3 fL (ref 80.0–100.0)
Platelets: 128 10*3/uL — ABNORMAL LOW (ref 150–400)
RBC: 4.83 MIL/uL (ref 4.22–5.81)
RDW: 12.6 % (ref 11.5–15.5)
WBC: 8.9 10*3/uL (ref 4.0–10.5)
nRBC: 0 % (ref 0.0–0.2)

## 2021-05-13 LAB — TROPONIN I (HIGH SENSITIVITY): Troponin I (High Sensitivity): 21 ng/L — ABNORMAL HIGH (ref <18)

## 2021-05-13 NOTE — ED Triage Notes (Signed)
Pt to ED via [facility] from home.  Pt c/o dizziness x 2 years, states he has been feeling weaker and dizzier than usual.  Pt had chest pain x 2 days, denies at this time. Has had nausea and vomiting blood x 2 days.  Pt  A &Ox4 in triage, NAD noted.

## 2021-05-14 ENCOUNTER — Inpatient Hospital Stay
Admission: EM | Admit: 2021-05-14 | Discharge: 2021-05-19 | DRG: 178 | Disposition: A | Discharge status: Home Health | Payer: No Typology Code available for payment source | Attending: Internal Medicine | Admitting: Internal Medicine

## 2021-05-14 ENCOUNTER — Encounter: Payer: Self-pay | Admitting: Family Medicine

## 2021-05-14 DIAGNOSIS — Z803 Family history of malignant neoplasm of breast: Secondary | ICD-10-CM | POA: Diagnosis not present

## 2021-05-14 DIAGNOSIS — R531 Weakness: Secondary | ICD-10-CM | POA: Diagnosis not present

## 2021-05-14 DIAGNOSIS — E1165 Type 2 diabetes mellitus with hyperglycemia: Secondary | ICD-10-CM | POA: Diagnosis not present

## 2021-05-14 DIAGNOSIS — F39 Unspecified mood [affective] disorder: Secondary | ICD-10-CM | POA: Diagnosis present

## 2021-05-14 DIAGNOSIS — M1990 Unspecified osteoarthritis, unspecified site: Secondary | ICD-10-CM | POA: Diagnosis present

## 2021-05-14 DIAGNOSIS — D6959 Other secondary thrombocytopenia: Secondary | ICD-10-CM | POA: Diagnosis present

## 2021-05-14 DIAGNOSIS — R778 Other specified abnormalities of plasma proteins: Secondary | ICD-10-CM

## 2021-05-14 DIAGNOSIS — E871 Hypo-osmolality and hyponatremia: Secondary | ICD-10-CM | POA: Diagnosis not present

## 2021-05-14 DIAGNOSIS — Z7984 Long term (current) use of oral hypoglycemic drugs: Secondary | ICD-10-CM | POA: Diagnosis not present

## 2021-05-14 DIAGNOSIS — G629 Polyneuropathy, unspecified: Secondary | ICD-10-CM | POA: Diagnosis not present

## 2021-05-14 DIAGNOSIS — Z953 Presence of xenogenic heart valve: Secondary | ICD-10-CM | POA: Diagnosis not present

## 2021-05-14 DIAGNOSIS — I2583 Coronary atherosclerosis due to lipid rich plaque: Secondary | ICD-10-CM

## 2021-05-14 DIAGNOSIS — I10 Essential (primary) hypertension: Secondary | ICD-10-CM | POA: Diagnosis not present

## 2021-05-14 DIAGNOSIS — Z823 Family history of stroke: Secondary | ICD-10-CM | POA: Diagnosis not present

## 2021-05-14 DIAGNOSIS — M48062 Spinal stenosis, lumbar region with neurogenic claudication: Secondary | ICD-10-CM | POA: Diagnosis present

## 2021-05-14 DIAGNOSIS — F32A Depression, unspecified: Secondary | ICD-10-CM | POA: Diagnosis present

## 2021-05-14 DIAGNOSIS — R42 Dizziness and giddiness: Secondary | ICD-10-CM

## 2021-05-14 DIAGNOSIS — Z951 Presence of aortocoronary bypass graft: Secondary | ICD-10-CM | POA: Diagnosis not present

## 2021-05-14 DIAGNOSIS — F1020 Alcohol dependence, uncomplicated: Secondary | ICD-10-CM | POA: Diagnosis present

## 2021-05-14 DIAGNOSIS — Z87891 Personal history of nicotine dependence: Secondary | ICD-10-CM | POA: Diagnosis not present

## 2021-05-14 DIAGNOSIS — U071 COVID-19: Secondary | ICD-10-CM | POA: Diagnosis not present

## 2021-05-14 DIAGNOSIS — R079 Chest pain, unspecified: Secondary | ICD-10-CM

## 2021-05-14 DIAGNOSIS — I2510 Atherosclerotic heart disease of native coronary artery without angina pectoris: Secondary | ICD-10-CM | POA: Diagnosis present

## 2021-05-14 DIAGNOSIS — Z8249 Family history of ischemic heart disease and other diseases of the circulatory system: Secondary | ICD-10-CM | POA: Diagnosis not present

## 2021-05-14 DIAGNOSIS — Z800 Family history of malignant neoplasm of digestive organs: Secondary | ICD-10-CM | POA: Diagnosis not present

## 2021-05-14 DIAGNOSIS — M109 Gout, unspecified: Secondary | ICD-10-CM | POA: Diagnosis present

## 2021-05-14 DIAGNOSIS — R739 Hyperglycemia, unspecified: Secondary | ICD-10-CM

## 2021-05-14 DIAGNOSIS — I440 Atrioventricular block, first degree: Secondary | ICD-10-CM | POA: Diagnosis present

## 2021-05-14 DIAGNOSIS — E1142 Type 2 diabetes mellitus with diabetic polyneuropathy: Secondary | ICD-10-CM | POA: Diagnosis present

## 2021-05-14 DIAGNOSIS — Z66 Do not resuscitate: Secondary | ICD-10-CM | POA: Diagnosis not present

## 2021-05-14 DIAGNOSIS — Z820 Family history of epilepsy and other diseases of the nervous system: Secondary | ICD-10-CM | POA: Diagnosis not present

## 2021-05-14 DIAGNOSIS — E785 Hyperlipidemia, unspecified: Secondary | ICD-10-CM | POA: Diagnosis present

## 2021-05-14 HISTORY — DX: Atherosclerotic heart disease of native coronary artery without angina pectoris: I25.10

## 2021-05-14 HISTORY — DX: Type 2 diabetes mellitus without complications: E11.9

## 2021-05-14 LAB — GLUCOSE, CAPILLARY
Glucose-Capillary: 218 mg/dL — ABNORMAL HIGH (ref 70–99)
Glucose-Capillary: 225 mg/dL — ABNORMAL HIGH (ref 70–99)

## 2021-05-14 LAB — HEPATIC FUNCTION PANEL
ALT: 22 U/L (ref 0–44)
AST: 36 U/L (ref 15–41)
Albumin: 3.9 g/dL (ref 3.5–5.0)
Alkaline Phosphatase: 78 U/L (ref 38–126)
Bilirubin, Direct: 0.3 mg/dL — ABNORMAL HIGH (ref 0.0–0.2)
Indirect Bilirubin: 0.9 mg/dL (ref 0.3–0.9)
Total Bilirubin: 1.2 mg/dL (ref 0.3–1.2)
Total Protein: 7.6 g/dL (ref 6.5–8.1)

## 2021-05-14 LAB — LIPASE, BLOOD: Lipase: 47 U/L (ref 11–51)

## 2021-05-14 LAB — TROPONIN I (HIGH SENSITIVITY): Troponin I (High Sensitivity): 35 ng/L — ABNORMAL HIGH (ref <18)

## 2021-05-14 MED ORDER — INSULIN ASPART 100 UNIT/ML IJ SOLN
0.0000 [IU] | Freq: Three times a day (TID) | INTRAMUSCULAR | Status: DC
  Administered 2021-05-14 – 2021-05-15 (×3): 5 [IU] via SUBCUTANEOUS
  Administered 2021-05-15: 11 [IU] via SUBCUTANEOUS
  Administered 2021-05-16: 5 [IU] via SUBCUTANEOUS
  Administered 2021-05-16 (×2): 8 [IU] via SUBCUTANEOUS
  Administered 2021-05-17 (×3): 5 [IU] via SUBCUTANEOUS
  Administered 2021-05-18: 15 [IU] via SUBCUTANEOUS
  Administered 2021-05-18: 11 [IU] via SUBCUTANEOUS
  Administered 2021-05-18 – 2021-05-19 (×2): 5 [IU] via SUBCUTANEOUS
  Administered 2021-05-19: 15 [IU] via SUBCUTANEOUS
  Filled 2021-05-14 (×15): qty 1

## 2021-05-14 MED ORDER — ACETAMINOPHEN 650 MG RE SUPP: 650.0000 mg | Freq: Four times a day (QID) | RECTAL | Status: DC | PRN

## 2021-05-14 MED ORDER — PANTOPRAZOLE SODIUM 40 MG PO TBEC
40.0000 mg | DELAYED_RELEASE_TABLET | Freq: Every day | ORAL | Status: DC
  Administered 2021-05-15 – 2021-05-19 (×5): 40 mg via ORAL
  Filled 2021-05-14 (×5): qty 1

## 2021-05-14 MED ORDER — LACTATED RINGERS IV SOLN: INTRAVENOUS | Status: AC

## 2021-05-14 MED ORDER — ENOXAPARIN SODIUM 40 MG/0.4ML IJ SOSY
40.0000 mg | PREFILLED_SYRINGE | INTRAMUSCULAR | Status: DC
  Administered 2021-05-14 – 2021-05-18 (×5): 40 mg via SUBCUTANEOUS
  Filled 2021-05-14 (×5): qty 0.4

## 2021-05-14 MED ORDER — SODIUM CHLORIDE 0.9 % IV SOLN
200.0000 mg | Freq: Once | INTRAVENOUS | Status: DC
  Filled 2021-05-14: qty 40

## 2021-05-14 MED ORDER — SODIUM CHLORIDE 0.9 % IV SOLN: 100.0000 mg | Freq: Every day | INTRAVENOUS | Status: DC

## 2021-05-14 MED ORDER — PREGABALIN 50 MG PO CAPS
50.0000 mg | ORAL_CAPSULE | Freq: Every day | ORAL | Status: DC
  Administered 2021-05-14 – 2021-05-18 (×5): 50 mg via ORAL
  Filled 2021-05-14 (×5): qty 1

## 2021-05-14 MED ORDER — ONDANSETRON 4 MG PO TBDP
4.0000 mg | ORAL_TABLET | Freq: Three times a day (TID) | ORAL | Status: DC | PRN
  Filled 2021-05-14: qty 1

## 2021-05-14 MED ORDER — NIRMATRELVIR/RITONAVIR (PAXLOVID)TABLET
3.0000 | ORAL_TABLET | Freq: Two times a day (BID) | ORAL | Status: AC
  Administered 2021-05-14 – 2021-05-18 (×10): 3 via ORAL
  Filled 2021-05-14: qty 30

## 2021-05-14 MED ORDER — INSULIN ASPART 100 UNIT/ML IJ SOLN
0.0000 [IU] | Freq: Every day | INTRAMUSCULAR | Status: DC
  Administered 2021-05-14 – 2021-05-16 (×3): 2 [IU] via SUBCUTANEOUS
  Administered 2021-05-17: 3 [IU] via SUBCUTANEOUS
  Filled 2021-05-14 (×4): qty 1

## 2021-05-14 MED ORDER — MIRTAZAPINE 15 MG PO TABS
30.0000 mg | ORAL_TABLET | Freq: Every day | ORAL | Status: DC
  Administered 2021-05-14 – 2021-05-18 (×5): 30 mg via ORAL
  Filled 2021-05-14 (×5): qty 2

## 2021-05-14 MED ORDER — ACETAMINOPHEN 325 MG PO TABS
650.0000 mg | ORAL_TABLET | Freq: Four times a day (QID) | ORAL | Status: DC | PRN
  Administered 2021-05-14 – 2021-05-18 (×5): 650 mg via ORAL
  Filled 2021-05-14 (×5): qty 2

## 2021-05-14 MED ORDER — ONDANSETRON HCL 4 MG PO TABS: 4.0000 mg | ORAL_TABLET | Freq: Four times a day (QID) | ORAL | Status: DC | PRN

## 2021-05-14 MED ORDER — PANTOPRAZOLE SODIUM 40 MG IV SOLR: 40.0000 mg | Freq: Once | INTRAVENOUS | Status: DC

## 2021-05-14 MED ORDER — ONDANSETRON HCL 4 MG/2ML IJ SOLN: 4.0000 mg | Freq: Four times a day (QID) | INTRAMUSCULAR | Status: DC | PRN

## 2021-05-14 NOTE — Assessment & Plan Note (Signed)
-   Continue mirtazapine 

## 2021-05-14 NOTE — Assessment & Plan Note (Signed)
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

## 2021-05-14 NOTE — Progress Notes (Signed)
Remdesivir - Pharmacy Brief Note   O:  ALT: 22 CXR: neg SpO2: 93% on RA   A/P:  Remdesivir 200 mg IVPB once followed by 100 mg IVPB daily x 4 days.   Bari Mantis PharmD Clinical Pharmacist 05/14/2021

## 2021-05-14 NOTE — Assessment & Plan Note (Addendum)
Due to COVID, probably also dehydratio due to COVID - IV fluids - Check orthostatics - PT eval

## 2021-05-14 NOTE — Assessment & Plan Note (Addendum)
Mild, no oxygen requirement, relatively few symptoms.  Given his age coronary disease recommend Paxlovid. - Start Paxlovid

## 2021-05-14 NOTE — H&P (Addendum)
History and Physical    Patient: Hector Miller GDJ:[id number] DOB: Mar 09, 1946 DOA: 05/14/2021 DOS: the patient was seen and examined on 05/14/2021 PCP: Lorre Munroe, NP  Patient coming from: [facility]  Chief Complaint: Weakness, couldn't stand from chair  HPI:   [name] is a 75 y.o. 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  Patient was in usual state of health until day of admission when he was in a public area of his ILF, couldn't stand, and so someone activated EMS.  He had no fever, cough.  He has chronic nausea, but had also vomited a few times this week.  In the ER here, vitals unremarkable.  CXR clear.  SpO2 93-95% on room air.  COVID+.  Electrolytes and renal function normal.  Couldn't stand, and so EDP asked for observation overnight for PT eval and possible rehab.          Review of Systems: Review of Systems  Constitutional:  Positive for malaise/fatigue. Negative for fever.  HENT:  Negative for congestion and sore throat.   Respiratory:  Negative for cough, hemoptysis, sputum production, shortness of breath and wheezing.   Cardiovascular:  Negative for chest pain.  Gastrointestinal:  Positive for nausea and vomiting.  Neurological:  Positive for dizziness and weakness. Negative for focal weakness.  All other systems reviewed and are negative.    Past Medical History:  Diagnosis Date   Arthritis    Chicken pox    Coronary artery disease    Diabetes mellitus without complication (HCC)    History of colon polyps    History of kidney stones    Hyperlipidemia    Hypertension    Past Surgical History:  Procedure Laterality Date   AORTIC VALVE REPAIR  2017   CARDIAC SURGERY  05/16/2012   open heart valve replacement and  by pass   CYST REMOVAL HAND Left    TONSILLECTOMY     Social History:  reports that he quit smoking about 51 years ago. His smoking use included cigarettes, cigars, and pipe. He has quit using smokeless tobacco. He reports current alcohol use. He reports that he does not use drugs.  No Known Allergies  Family History  Problem Relation Age of Onset   Cancer Mother        Breast   Parkinson's disease Mother    Heart failure Father    Cancer Father        Stomach   Stroke Father    Hypertension Father     Prior to Admission medications   Medication Sig Start Date End Date Taking? Authorizing Provider  allopurinol (ZYLOPRIM) 300 MG tablet Take 300 mg by mouth daily.    [provider]  Cholecalciferol (VITAMIN D3) 2000 UNITS TABS Take 1 tablet by mouth daily.    [provider]  finasteride (PROSCAR) 5 MG tablet Take 5 mg by mouth daily.    [provider]  folic acid (FOLVITE) 1 MG tablet Take 1 mg by mouth daily.    [provider]  furosemide (LASIX) 20 MG tablet Take 20 mg by mouth.    [provider]  losartan (COZAAR) 25 MG tablet Take 25 mg by mouth daily.    [provider]  meclizine (  ANTIVERT) 25 MG tablet Take 25 mg by mouth 3 (three) times daily as needed for dizziness.    [provider]  mirtazapine (REMERON) 30 MG tablet Take 30 mg by mouth at bedtime.    [provider]  pantoprazole (PROTONIX) 40 MG tablet Take 1 tablet (40 mg total) by mouth daily. MUST SCHEDULE ANNUAL EXAM FOR MORE REFILLS 10/19/15   Lorre Munroe, NP  pregabalin (LYRICA) 25 MG capsule Take 1 capsule (25 mg total) by mouth at bedtime for 20 days, THEN 2 capsules (50 mg total) at bedtime. 02/25/21 04/26/21  Edward Jolly, MD  rosuvastatin (CRESTOR) 20 MG tablet Take 20 mg by mouth daily.    [provider]  thiamine (VITAMIN B-1) 100 MG tablet Take 100 mg by mouth daily.    [provider]    Physical  Exam: Vitals:   05/13/21 2104 05/13/21 2106 05/14/21 0318 05/14/21 0807  BP: (!) 116/93  135/77 126/71  Pulse: 97  93 87  Resp: 20  (!) 22 16  Temp: 99.3 F (37.4 C)  98 F (36.7 C) 98.8 F (37.1 C)  TempSrc: Oral  Oral Oral  SpO2: 95%  92% 93%  Weight:  99.8 kg    Height:  6' (1.829 m)     General appearance: Elderly adult male, lying bed, no acute distress, interactive, appears tired and listless.    Observed patient attempt to stand, and he could not. HEENT: Anicteric, conjunctive are pink, lids and lashes normal.  No nasal ring, discharge, epistaxis.  Dentition in good repair, lips dry, mucous membranes dry, no oral lesions, hearing normal Lymph: No cervical or supraclavicular lymphadenopathy Skin: No suspicious rashes or lesions. Cardiac: RRR, I do not appreciate murmurs, normal S1-S2, trace nonpitting lower extremity edema, JVP normal Respiratory: Normal respiratory effort, lungs clear without rales or wheezes Abdomen: Abdomen soft no tenderness palpation or guarding, no hepatosplenomegaly.  No distention. MSK: Muscle bulk and tone. Neuro: Face symmetric, speech fluent, moves upper EXTR lower extremities with 5 -/5 strength, symmetric coordination.  No nystagmus. Psych: Attention normal, affect blunted, judgment insight appear mildly impaired although he may be this is just evasiveness   Data Reviewed: Complete metabolic panel notable for normal LFTs, normal electrolytes, hyperglycemia, normal renal function. Complete blood count unremarkable.  COVID-positive.  Lipase normal.        Assessment/Plan * COVID-19 virus infection- (present on admission) Mild, no oxygen requirement, relatively few symptoms.  Given his age coronary disease recommend Paxlovid. - Start Paxlovid    Weakness Due to COVID, probably also dehydratio due to COVID - IV fluids - Check orthostatics - PT eval   Type 2 diabetes mellitus with hyperglycemia (HCC)- (present on admission) This appears  to have been diagnosed with an A1c of 10.6 in October 2022 from his notes in [place] from the [place].    Patient denies history of diabetes, unsure if he has mild cognitive impairment or is being evasive.  Probably the latter.  I do not suspect encephalopathy.  He is supposed to be on metformin. - Sliding scale corrections insulin - Resume metformin on d/c  Coronary artery disease- (present on admission) Status post bypass in 2014 and aortic valve repair - Hold Crestor while on Paxlovid - Hold losartan and furosemide for now  HTN (hypertension)- (present on admission) Blood pressure low normal - Hold losartan, furosemide  Neuropathy PCP notes indicate that Neurology have evaluated patient extensively, feel this is alcohol related in origin -Continue Lyrica  Mood  disorder (HCC)- (present on admission) - Continue mirtazapine  Dizziness This is chornic, at baseline, for years.  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     Advance Care Planning: CODE STATUS discussed, DNR  Consults: None indicated  Family Communication: None present  Severity of Illness: At the time of admission, it appears that the appropriate admission status for this patient is INPATIENT. This is judged to be reasonable and necessary in order to provide the required intensity of service to ensure the patient's safety given: -presenting symptoms of weakness, malaise -physical exam findings of inability to stand, generalized weakness, and  -initial radiographic and laboratory data COVID, hyperglycemia -in the context of their chronic comorbidities advanced age, coronary disease, diabetes    Together, these circumstances are felt to place him at high risk for further clinical deterioration threatening life, limb, or organ requiring a high intensity of service.  I certify  that at the point of admission it is my clinical judgment that the patient will require inpatient hospital care spanning beyond 2 midnights from the point of admission and that early discharge would result in unnecessary risk of decompensation and readmission or threat to life, limb or bodily function.   Author: Alberteen Sam 05/14/2021 1:48 PM  For on call review [email or web address].

## 2021-05-14 NOTE — ED Provider Notes (Signed)
[facility] Emergency Department Provider Note  ____________________________________________   Event Date/Time   First MD Initiated Contact with Patient 05/14/21 1218     (approximate)  I have reviewed the triage vital signs and the nursing notes.   HISTORY  Chief Complaint Weakness   HPI Hector Miller is a 75 y.o. male with below noted past medical history including chronic dizziness and peripheral neuropathy who presents from assisted living facility for assessment of generalized weakness and some nausea and vomiting over the last 2 days.  Patient states he noticed some blood streaks in his vomit today.  While he had endorsed to triage nurse that he had some chest pain over the last 2 days he is denying this to this examiner.  He denies any change in his chronic dizziness, new headache, shortness of breath, abdominal pain, back pain, diarrhea, urinary symptoms rash or extremity pain.  No focal weakness numbness or tingling.  No recent falls or injuries.  States she had to get significant help to even stand up today and is typically able to ambulate around without any difficulty or assistance.         Past Medical History:  Diagnosis Date   Arthritis    Chicken pox    Frequent headaches    Heart disease    History of colon polyps    History of kidney stones    Hyperlipidemia    Hypertension     Patient Active Problem List   Diagnosis Date Noted   Spinal stenosis, lumbar region, with neurogenic claudication 02/25/2021   Lumbar facet arthropathy 02/25/2021   Lumbar radiculopathy 02/25/2021   Chronic pain syndrome 02/25/2021   Gout 12/02/2014   HLD (hyperlipidemia) 12/02/2014   HTN (hypertension) 12/02/2014   Gastroesophageal reflux disease without esophagitis 12/02/2014   BPH (benign prostatic hyperplasia) 12/02/2014   Arthritis of back 12/02/2014   Frequent headaches 12/02/2014    Past Surgical History:  Procedure Laterality Date    CARDIAC SURGERY  2014   open heart valve replacement and by pass   CYST REMOVAL HAND Left    TONSILLECTOMY      Prior to Admission medications   Medication Sig Start Date End Date Taking? Authorizing Provider  allopurinol (ZYLOPRIM) 300 MG tablet Take 300 mg by mouth daily.    [provider]  Cholecalciferol (VITAMIN D3) 2000 UNITS TABS Take 1 tablet by mouth daily.    [provider]  finasteride (PROSCAR) 5 MG tablet Take 5 mg by mouth daily.    [provider]  folic acid (FOLVITE) 1 MG tablet Take 1 mg by mouth daily.    [provider]  furosemide (LASIX) 20 MG tablet Take 20 mg by mouth.    [provider]  losartan (COZAAR) 25 MG tablet Take 25 mg by mouth daily.    [provider]  meclizine (ANTIVERT) 25 MG tablet Take 25 mg by mouth 3 (three) times daily as needed for dizziness.    [provider]  mirtazapine (REMERON) 30 MG tablet Take 30 mg by mouth at bedtime.    [provider]  pantoprazole (PROTONIX) 40 MG tablet Take 1 tablet (40 mg total) by mouth daily. MUST SCHEDULE ANNUAL EXAM FOR MORE REFILLS 10/19/15   Lorre Munroe, NP  pregabalin (LYRICA) 25 MG capsule Take 1 capsule (25 mg total) by mouth at bedtime for 20 days, THEN 2 capsules (50 mg total) at bedtime. 02/25/21 04/26/21  Edward Jolly, MD  rosuvastatin (CRESTOR) 20 MG tablet Take 20 mg by mouth daily.    [provider]  thiamine (VITAMIN B-1) 100 MG tablet Take 100 mg by mouth daily.    [provider]    Allergies Patient has no known allergies.  Family History  Problem Relation Age of Onset   Cancer Mother        Breast   Parkinson's disease Mother    Heart failure Father    Cancer Father        Stomach   Stroke Father    Hypertension Father     Social History Social History   Tobacco Use   Smoking status: Former    Types: Cigarettes, Cigars, Pipe    Quit date: 03/26/1970    Years since quitting: 51.1    Smokeless tobacco: Former  Building services engineer Use: Never used  Substance Use Topics   Alcohol use: Yes    Alcohol/week: 0.0 standard drinks    Comment: daily-5-6 shots of liquor   Drug use: No    Review of Systems  Review of Systems  Constitutional:  Positive for malaise/fatigue. Negative for chills and fever.  HENT:  Negative for sore throat.   Eyes:  Negative for pain.  Respiratory:  Positive for cough. Negative for stridor.   Cardiovascular:  Positive for chest pain (in traige but denying to this examiner).  Gastrointestinal:  Positive for nausea and vomiting.  Genitourinary:  Negative for dysuria.  Musculoskeletal:  Negative for myalgias.  Skin:  Negative for rash.  Neurological:  Positive for dizziness and weakness. Negative for seizures, loss of consciousness and headaches.  Psychiatric/Behavioral:  Negative for suicidal ideas.   All other systems reviewed and are negative.    ____________________________________________   PHYSICAL EXAM:  VITAL SIGNS: ED Triage Vitals  Enc Vitals Group     BP 05/13/21 2104 (!) 116/93     Pulse Rate 05/13/21 2104 97     Resp 05/13/21 2104 20     Temp 05/13/21 2104 99.3 F (37.4 C)     Temp Source 05/13/21 2104 Oral     SpO2 05/13/21 2104 95 %     Weight 05/13/21 2106 220 lb (99.8 kg)     Height 05/13/21 2106 6' (1.829 m)     Head Circumference --      Peak Flow --      Pain Score 05/13/21 2106 10     Pain Loc --      Pain Edu? --      Excl. in GC? --    Vitals:   05/14/21 0318 05/14/21 0807  BP: 135/77 126/71  Pulse: 93 87  Resp: (!) 22 16  Temp: 98 F (36.7 C) 98.8 F (37.1 C)  SpO2: 92% 93%   Physical Exam Vitals and nursing note reviewed.  Constitutional:      General: He is not in acute distress.    Appearance: He is well-developed.  HENT:     Head: Normocephalic and atraumatic.     Right Ear: External ear normal.     Left Ear: External ear normal.     Nose: Nose normal.     Mouth/Throat:     Mouth:  Mucous membranes are dry.  Eyes:     Conjunctiva/sclera: Conjunctivae normal.  Cardiovascular:     Rate and Rhythm: Normal rate and regular rhythm.     Heart sounds: No murmur heard. Pulmonary:     Effort: Pulmonary effort is normal. No  respiratory distress.     Breath sounds: Normal breath sounds.  Abdominal:     Palpations: Abdomen is soft.     Tenderness: There is no abdominal tenderness.  Musculoskeletal:        General: No swelling.     Cervical back: Neck supple.  Skin:    General: Skin is warm and dry.     Capillary Refill: Capillary refill takes 2 to 3 seconds.  Neurological:     Mental Status: He is alert and oriented to person, place, and time.  Psychiatric:        Mood and Affect: Mood normal.    No pronator drift.  No finger dysmetria.  Cranial nerves II through XII are grossly intact.  Patient is symmetric strength in his bilateral upper and lower extremities.  He is unable to stand up off the bed secondary to weakness.  Sensation intact light touch throughout all extremities.  2+ radial pulse. ____________________________________________   LABS (all labs ordered are listed, but only abnormal results are displayed)  Labs Reviewed  RESP PANEL BY RT-PCR (FLU A&B, COVID) ARPGX2 - Abnormal; Notable for the following components:      Result Value   SARS Coronavirus 2 by RT PCR POSITIVE (*)    All other components within normal limits  BASIC METABOLIC PANEL - Abnormal; Notable for the following components:   Sodium 130 (*)    Chloride 97 (*)    Glucose, Bld 250 (*)    All other components within normal limits  CBC - Abnormal; Notable for the following components:   MCH 34.2 (*)    Platelets 128 (*)    All other components within normal limits  HEPATIC FUNCTION PANEL - Abnormal; Notable for the following components:   Bilirubin, Direct 0.3 (*)    All other components within normal limits  TROPONIN I (HIGH SENSITIVITY) - Abnormal; Notable for the following  components:   Troponin I (High Sensitivity) 21 (*)    All other components within normal limits  TROPONIN I (HIGH SENSITIVITY) - Abnormal; Notable for the following components:   Troponin I (High Sensitivity) 35 (*)    All other components within normal limits  LIPASE, BLOOD  URINALYSIS, ROUTINE W REFLEX MICROSCOPIC   ____________________________________________  EKG  ECG remarkable for sinus rhythm with first-degree AV block with a PR interval of 212, ventricular rate of 97, otherwise no clear evidence of acute ischemia or significant arrhythmia. ____________________________________________  RADIOLOGY  ED MD interpretation: Chest x-ray shows no focal consolidation, effusion, edema, pneumothorax or other clear acute thoracic process.  Official radiology report(s): DG Chest Port 1 View  Result Date: 05/13/2021 CLINICAL DATA:  Dizziness for 2 years. Chest pain for 2 days. Nausea and vomiting. EXAM: PORTABLE CHEST 1 VIEW COMPARISON:  03/26/2013 FINDINGS: Postoperative changes in the mediastinum. Cardiac valve prosthesis. Heart size and pulmonary vascularity are normal. Lungs are clear. No pleural effusions. No pneumothorax. Linear atelectasis in the left base. Mediastinal contours appear intact. Calcification of the aorta. IMPRESSION: No active disease. Electronically Signed   By: Burman Nieves M.D.   On: 05/13/2021 21:43    ____________________________________________   PROCEDURES  Procedure(s) performed (including Critical Care):  .1-3 Lead EKG Interpretation Performed by: Gilles Chiquito, MD Authorized by: Gilles Chiquito, MD     Interpretation: non-specific     ECG rate assessment: normal     Rhythm: sinus rhythm     Conduction: abnormal     Abnormal conduction: 1st degree AV block  ____________________________________________   INITIAL IMPRESSION / ASSESSMENT AND PLAN / ED COURSE      Patient presents with above-stated history exam for assessment of  generalized weakness associate with some nausea and vomiting with some blood streaks.  Patient is denying any associate chest pain this examiner although it seems he had reported some earlier in triage RN.  On arrival he is afebrile hemodynamically stable.  There is no history or exam features suggest recent trauma or CVA.  Additional differential considerations include acute infectious process, ACS, arrhythmia, anemia, metabolic derangements and endocrine derangements.  Chest x-ray shows no focal consolidation, effusion, edema, pneumothorax or other clear acute thoracic process.  ECG remarkable for sinus rhythm with first-degree AV block with a PR interval of 212, ventricular rate of 97, otherwise no clear evidence of acute ischemia or significant arrhythmia.  Troponin did increase slightly from 21-35 which I suspect represents some demand ischemia in the setting of COVID and dehydration.  BMP remarkable for glucose of 250 without other significant electrolyte or metabolic derangements.  CBC shows no leukocytosis or acute anemia.  COVID PCR is positive.  Influenza is negative.  I suspect this is likely the cause of patient's weakness.  Lipase not consistent with acute pancreatitis,.  Hepatic function panel is unremarkable.  Patient is not hypoxic given he is unable to stand and is residing currently in assisted living will admit to medicine service for further evaluation and management.      ____________________________________________   FINAL CLINICAL IMPRESSION(S) / ED DIAGNOSES  Final diagnoses:  COVID  Troponin I above reference range  Hyperglycemia    Medications  lactated ringers infusion (has no administration in time range)  pantoprazole (PROTONIX) injection 40 mg (has no administration in time range)  ondansetron (ZOFRAN-ODT) disintegrating tablet 4 mg (has no administration in time range)     ED Discharge Orders     None        Note:  This document was prepared  using Dragon voice recognition software and may include unintentional dictation errors.    Gilles Chiquito, MD 05/14/21 [phone]

## 2021-05-14 NOTE — Assessment & Plan Note (Signed)
PCP notes indicate that Neurology have evaluated patient extensively, feel this is alcohol related in origin -Continue Lyrica

## 2021-05-14 NOTE — Assessment & Plan Note (Addendum)
This appears to have been diagnosed with an A1c of 10.6 in October 2022 from his notes in [place] from the [place].    Patient denies history of diabetes, unsure if he has mild cognitive impairment or is being evasive.  Probably the latter.  I do not suspect encephalopathy.  He is supposed to be on metformin. - Sliding scale corrections insulin - Resume metformin on d/c

## 2021-05-14 NOTE — Assessment & Plan Note (Signed)
Blood pressure low normal - Hold losartan, furosemide

## 2021-05-14 NOTE — Assessment & Plan Note (Signed)
This is chornic, at baseline, for years.  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

## 2021-05-14 NOTE — Hospital Course (Signed)
Mr. Gerads is a 75 y.o. 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  Patient was in usual state of health until day of admission when he was in a public area of his ILF, couldn't stand, and so someone activated EMS.  He had no fever, cough.  He has chronic nausea, but had also vomited a few times this week.  In the ER here, vitals unremarkable.  CXR clear.  SpO2 93-95% on room air.  COVID+.  Electrolytes and renal function normal.  Couldn't stand, and so EDP asked for observation overnight for PT eval and possible rehab.

## 2021-05-15 DIAGNOSIS — E871 Hypo-osmolality and hyponatremia: Secondary | ICD-10-CM

## 2021-05-15 DIAGNOSIS — U071 COVID-19: Secondary | ICD-10-CM | POA: Diagnosis not present

## 2021-05-15 DIAGNOSIS — F39 Unspecified mood [affective] disorder: Secondary | ICD-10-CM | POA: Diagnosis not present

## 2021-05-15 LAB — GLUCOSE, CAPILLARY
Glucose-Capillary: 221 mg/dL — ABNORMAL HIGH (ref 70–99)
Glucose-Capillary: 307 mg/dL — ABNORMAL HIGH (ref 70–99)
Glucose-Capillary: 223 mg/dL — ABNORMAL HIGH (ref 70–99)
Glucose-Capillary: 222 mg/dL — ABNORMAL HIGH (ref 70–99)

## 2021-05-15 LAB — URINE DRUG SCREEN, QUALITATIVE (ARMC ONLY)
Amphetamines, Ur Screen: NOT DETECTED
Barbiturates, Ur Screen: NOT DETECTED
Benzodiazepine, Ur Scrn: NOT DETECTED
Cannabinoid 50 Ng, Ur ~~LOC~~: NOT DETECTED
Cocaine Metabolite,Ur ~~LOC~~: NOT DETECTED
MDMA (Ecstasy)Ur Screen: NOT DETECTED
Methadone Scn, Ur: NOT DETECTED
Opiate, Ur Screen: NOT DETECTED
Phencyclidine (PCP) Ur S: NOT DETECTED
Tricyclic, Ur Screen: NOT DETECTED

## 2021-05-15 LAB — ETHANOL: Alcohol, Ethyl (B): <10 mg/dL (ref <10)

## 2021-05-15 MED ORDER — IPRATROPIUM-ALBUTEROL 0.5-2.5 (3) MG/3ML IN SOLN: 3.0000 mL | Freq: Four times a day (QID) | RESPIRATORY_TRACT | Status: DC | PRN

## 2021-05-15 MED ORDER — DOCUSATE SODIUM 100 MG PO CAPS
200.0000 mg | ORAL_CAPSULE | Freq: Two times a day (BID) | ORAL | Status: DC
  Administered 2021-05-15 – 2021-05-19 (×9): 200 mg via ORAL
  Filled 2021-05-15 (×9): qty 2

## 2021-05-15 MED ORDER — LORAZEPAM 2 MG/ML IJ SOLN
1.0000 mg | INTRAMUSCULAR | Status: DC | PRN
Start: 2021-05-15 — End: 2021-05-19

## 2021-05-15 NOTE — Progress Notes (Signed)
PROGRESS NOTE    Hector Miller  ZOX:[id number] DOB: [birth year] DOA: 05/14/2021 PCP: Lorre Munroe, NP    Assessment & Plan:   Principal Problem:   COVID-19 virus infection Active Problems:   HTN (hypertension)   Spinal stenosis, lumbar region, with neurogenic claudication   Coronary artery disease   Type 2 diabetes mellitus with hyperglycemia (HCC)   Neuropathy   Dizziness   Weakness   Mood disorder (HCC)   COVID-19: continue on paxlovid, bronchodilators and encourage incentive spirometry   Alcohol abuse: significant abuse as per pt's nephew. Ativan prn  Thrombocytopenia: likely secondary to alcohol abuse. Will continue to monitor   Hyponatremia: encourage po intake    Weakness: likely secondary to COVID19. PT/OT consulted    DM2: poorly controlled, last HbA1c 10.6. 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 Continue to hold losartan, lasix    HTN: hold home dose of losartan, lasix secondary to low normal BP    Neuropathy: maybe alcohol related as per PCP notes. Continue on home dose of pregabalin    Mood disorder: continue on home dose of remeron    Dizziness: chronic. 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   DVT prophylaxis: lovenox  Code Status: DNR Family Communication: discussed pt's care w/ pt's nephew, Gene [phone]) and answered his questions  Disposition Plan: depends on PT/OT recs   Level of care: Med-Surg  Status is: Inpatient  Remains inpatient appropriate because: severity of illness     Consultants:    Procedures:   Antimicrobials:    Subjective: Pt c/o malaise   Objective: Vitals:   05/14/21 1652 05/14/21 2157 05/14/21 2359 05/15/21 0443  BP: 122/61 130/72 (!) 126/58 115/71  Pulse: 96 96 83 92  Resp: 20 16 18 19   Temp: 99.2 F (37.3 C)  97.8 F (36.6 C) (!) 97.5 F (36.4 C) 97.6 F (36.4 C)  TempSrc: Oral Oral Oral Oral  SpO2: 98% 99% 92% 97%  Weight:      Height:        Intake/Output Summary (Last 24 hours) at 05/15/2021 0746 Last data filed at 05/15/2021 0500 Gross per 24 hour  Intake --  Output 450 ml  Net -450 ml   Filed Weights   05/13/21 2106  Weight: 99.8 kg    Examination:  General exam: Appears calm and comfortable  Respiratory system: diminished breath sounds b/l  Cardiovascular system: S1 & S2 +. No rubs, gallops or clicks.  Gastrointestinal system: Abdomen is nondistended, soft and nontender. Normal bowel sounds heard. Central nervous system: Alert and oriented. Moves all extremities  Psychiatry: Judgement and insight appear normal. Mood & affect appropriate.     Data Reviewed: I have personally reviewed following labs and imaging studies  CBC: Recent Labs  Lab 05/13/21 2111  WBC 8.9  HGB 16.5  HCT 47.5  MCV 98.3  PLT 128*   Basic Metabolic Panel: Recent Labs  Lab 05/13/21 2111  NA 130*  K 4.0  CL 97*  CO2 23  GLUCOSE 250*  BUN 14  CREATININE 0.94  CALCIUM 9.1   GFR: Estimated Creatinine Clearance: 81.8 mL/min (by C-G formula based on SCr of 0.94 mg/dL). Liver Function Tests: Recent Labs  Lab 05/13/21 2111  AST 36  ALT 22  ALKPHOS 78  BILITOT 1.2  PROT  7.6  ALBUMIN 3.9   Recent Labs  Lab 05/13/21 2111  LIPASE 47   No results for input(s): AMMONIA in the last 168 hours. Coagulation Profile: No results for input(s): INR, PROTIME in the last 168 hours. Cardiac Enzymes: No results for input(s): CKTOTAL, CKMB, CKMBINDEX, TROPONINI in the last 168 hours. BNP (last 3 results) No results for input(s): PROBNP in the last 8760 hours. HbA1C: No results for input(s): HGBA1C in the last 72 hours. CBG: Recent Labs  Lab 05/14/21 1654 05/14/21 2043  GLUCAP 218* 225*   Lipid Profile: No results for input(s): CHOL, HDL, LDLCALC, TRIG, CHOLHDL, LDLDIRECT in the  last 72 hours. Thyroid Function Tests: No results for input(s): TSH, T4TOTAL, FREET4, T3FREE, THYROIDAB in the last 72 hours. Anemia Panel: No results for input(s): VITAMINB12, FOLATE, FERRITIN, TIBC, IRON, RETICCTPCT in the last 72 hours. Sepsis Labs: No results for input(s): PROCALCITON, LATICACIDVEN in the last 168 hours.  Recent Results (from the past 240 hour(s))  Resp Panel by RT-PCR (Flu A&B, Covid) Nasopharyngeal Swab     Status: Abnormal   Collection Time: 05/13/21  9:11 PM   Specimen: Nasopharyngeal Swab; Nasopharyngeal(NP) swabs in vial transport medium  Result Value Ref Range Status   SARS Coronavirus 2 by RT PCR POSITIVE (A) NEGATIVE Final    Comment: (NOTE) SARS-CoV-2 target nucleic acids are DETECTED.  The SARS-CoV-2 RNA is generally detectable in upper respiratory specimens during the acute phase of infection. Positive results are indicative of the presence of the identified virus, but do not rule out bacterial infection or co-infection with other pathogens not detected by the test. Clinical correlation with patient history and other diagnostic information is necessary to determine patient infection status. The expected result is Negative.  Fact Sheet for Patients: BloggerCourse.com  Fact Sheet for Healthcare Providers: SeriousBroker.it  This test is not yet approved or cleared by the [place] FDA and  has been authorized for detection and/or diagnosis of SARS-CoV-2 by FDA under an Emergency Use Authorization (EUA).  This EUA will remain in effect (meaning this test can be used) for the duration of  the COVID-19 declaration under Section 564(b)(1) of the A ct, 21 U.S.C. section 360bbb-3(b)(1), unless the authorization is terminated or revoked sooner.     Influenza A by PCR NEGATIVE NEGATIVE Final   Influenza B by PCR NEGATIVE NEGATIVE Final    Comment: (NOTE) The Xpert Xpress SARS-CoV-2/FLU/RSV plus assay  is intended as an aid in the diagnosis of influenza from Nasopharyngeal swab specimens and should not be used as a sole basis for treatment. Nasal washings and aspirates are unacceptable for Xpert Xpress SARS-CoV-2/FLU/RSV testing.  Fact Sheet for Patients: BloggerCourse.com  Fact Sheet for Healthcare Providers: SeriousBroker.it  This test is not yet approved or cleared by the [place] FDA and has been authorized for detection and/or diagnosis of SARS-CoV-2 by FDA under an Emergency Use Authorization (EUA). This EUA will remain in effect (meaning this test can be used) for the duration of the COVID-19 declaration under Section 564(b)(1) of the Act, 21 U.S.C. section 360bbb-3(b)(1), unless the authorization is terminated or revoked.  Performed at [facility], [address], [place], [place] [address]          Radiology Studies: [facility] Chest [place] 1 View  Result Date: 05/13/2021 CLINICAL DATA:  Dizziness for 2 years. Chest pain for 2 days. Nausea and vomiting. EXAM: PORTABLE CHEST 1 VIEW COMPARISON:  03/26/2013 FINDINGS: Postoperative changes in the mediastinum. Cardiac valve prosthesis. Heart size  and pulmonary vascularity are normal. Lungs are clear. No pleural effusions. No pneumothorax. Linear atelectasis in the left base. Mediastinal contours appear intact. Calcification of the aorta. IMPRESSION: No active disease. Electronically Signed   By: Burman Nieves M.D.   On: 05/13/2021 21:43        Scheduled Meds:  enoxaparin (LOVENOX) injection  40 mg Subcutaneous Q24H   insulin aspart  0-15 Units Subcutaneous TID WC   insulin aspart  0-5 Units Subcutaneous QHS   mirtazapine  30 mg Oral QHS   nirmatrelvir/ritonavir EUA  3 tablet Oral BID   pantoprazole  40 mg Oral Daily   pregabalin  50 mg Oral QHS   Continuous Infusions:   LOS: 1 day    Time spent: 30 mins     Charise Killian, MD [facility] Pager [phone]  If 7PM-7AM, please contact night-coverage 05/15/2021, 7:46 AM

## 2021-05-15 NOTE — Plan of Care (Signed)
  Problem: Respiratory: Goal: Will maintain a patent airway Outcome: Progressing Goal: Complications related to the disease process, condition or treatment will be avoided or minimized Outcome: Progressing   

## 2021-05-15 NOTE — Evaluation (Signed)
Physical Therapy Evaluation Patient Details Name: Hector Miller MRN: [id number] DOB: 1945/12/25 Today's Date: 05/15/2021  History of Present Illness  Pt is a 75 y/o M admitted on 05/14/21 with c/c of weakness & inability to stand from chair. 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 Pt initially requires significant assistance for bed mobility & sit>stand. PT provides education/cuing for anterior weight shifting & pt with progressively improving ability to transfer to standing with decreasing assistance. Pt is able to ambulate within room, but multiple laps, with RW & min assist. Pt is progressing well but at this time is unsafe to d/c home alone & would benefit from STR upon d/c to maximize independence with functional mobility & reduce fall risk prior to return home.   BP checked in LUE: Supine: 97/65 mmHg, MAP 76, HR 87 bpm Sitting: 118/69 mmHg, MAP 83, HR 93 bpm     Recommendations for follow up therapy are one component of a multi-disciplinary discharge planning process, led by the attending physician.  Recommendations may be updated based on patient status, additional functional criteria and insurance authorization.  Follow Up Recommendations Skilled nursing-short term rehab (<3 hours/day)    Assistance Recommended at Discharge Frequent or constant Supervision/Assistance  Functional Status Assessment Patient has had a recent decline in their functional status and demonstrates the ability to make significant improvements in function in a reasonable and predictable amount of time.  Equipment Recommendations  Rolling walker (2 wheels)    Recommendations for Other Services       Precautions / Restrictions Precautions Precautions: Fall Restrictions Weight  Bearing Restrictions: No      Mobility  Bed Mobility Overal bed mobility: Needs Assistance Bed Mobility: Supine to Sit     Supine to sit: Mod assist;HOB elevated     General bed mobility comments: cuing to use bed rails, assistance to upright trunk & sit EOB    Transfers Overall transfer level: Needs assistance Equipment used: Rolling walker (2 wheels) Transfers: Sit to/from Stand;Bed to chair/wheelchair/BSC Sit to Stand:  (initially mod/max fade to min assist)   Step pivot transfers: Min assist;Mod assist            Ambulation/Gait Ambulation/Gait assistance: Min assist Gait Distance (Feet):  (35 + 70 ft in room with RW) Assistive device: Rolling walker (2 wheels) Gait Pattern/deviations: Decreased step length - left;Decreased step length - right;Decreased stride length Gait velocity: slightly decreased     General Gait Details: cuing to ambulate within base of AD especially when turning  Stairs            Wheelchair Mobility    Modified Rankin (Stroke Patients Only)       Balance Overall balance assessment: Needs assistance Sitting-balance support: Feet supported;Bilateral upper extremity supported Sitting balance-Leahy Scale: Fair Sitting balance - Comments: close supervision<>CGA static sitting EOB   Standing balance support: Bilateral upper extremity supported;During functional activity Standing balance-Leahy Scale: Poor                               Pertinent Vitals/Pain Pain Assessment: Faces Faces Pain Scale: Hurts whole lot Pain Location: R buttocks cheek Pain Descriptors / Indicators: Discomfort;Sore (redness noted to area (pt already with sacral patch donned)) Pain  Intervention(s):  (provided pillow in recliner to cushion seat, notified nurse)    Home Living Family/patient expects to be discharged to::  (Independent Living apartment)                        Prior Function Prior Level of Function :  Independent/Modified Independent             Mobility Comments: Mod I with rollator       Hand Dominance        Extremity/Trunk Assessment   Upper Extremity Assessment Upper Extremity Assessment: Generalized weakness    Lower Extremity Assessment Lower Extremity Assessment: Generalized weakness       Communication   Communication: No difficulties  Cognition Arousal/Alertness: Awake/alert Behavior During Therapy: Flat affect Overall Cognitive Status: Within Functional Limits for tasks assessed                                 General Comments: AxOx4        General Comments      Exercises Other Exercises Other Exercises: Pt performed 5x sit<>stand from recliner with min assist with focus on anterior weight shifting, hand placement and pushing/powering up to standing.   Assessment/Plan    PT Assessment Patient needs continued PT services  PT Problem List Decreased strength;Decreased mobility;Decreased balance;Decreased knowledge of use of DME;Pain;Decreased activity tolerance       PT Treatment Interventions DME instruction;Therapeutic exercise;Gait training;Balance training;Neuromuscular re-education;Functional mobility training;Cognitive remediation;Therapeutic activities;Patient/family education    PT Goals (Current goals can be found in the Care Plan section)  Acute Rehab PT Goals Patient Stated Goal: decreased buttocks pain PT Goal Formulation: With patient Time For Goal Achievement: 05/29/21 Potential to Achieve Goals: Good    Frequency Min 2X/week   Barriers to discharge Decreased caregiver support      Co-evaluation               AM-PAC PT "6 Clicks" Mobility  Outcome Measure Help needed turning from your back to your side while in a flat bed without using bedrails?: A Little Help needed moving from lying on your back to sitting on the side of a flat bed without using bedrails?: Total Help needed moving to and from a bed  to a chair (including a wheelchair)?: A Lot Help needed standing up from a chair using your arms (e.g., wheelchair or bedside chair)?: A Lot Help needed to walk in hospital room?: A Little Help needed climbing 3-5 steps with a railing? : A Lot 6 Click Score: 13    End of Session Equipment Utilized During Treatment: Gait belt Activity Tolerance: Patient tolerated treatment well Patient left: in chair;with chair alarm set;with call bell/phone within reach Nurse Communication: Mobility status (c/o pain) PT Visit Diagnosis: Unsteadiness on feet (R26.81);Difficulty in walking, not elsewhere classified (R26.2);Muscle weakness (generalized) (M62.81)    Time: 5009-3818 PT Time Calculation (min) (ACUTE ONLY): 23 min   Charges:   PT Evaluation $PT Eval Moderate Complexity: 1 Mod PT Treatments $Therapeutic Activity: 8-22 mins        Aleda Grana, PT, DPT 05/15/21, 2:11 PM   Sandi Mariscal 05/15/2021, 2:09 PM

## 2021-05-15 NOTE — Evaluation (Signed)
Occupational Therapy Evaluation Patient Details Name: Hector Miller MRN: [id number] DOB: [birth year] Today's Date: 05/15/2021   History of Present Illness Pt is a 75 y/o M admitted on 05/14/21 with c/c of weakness & inability to stand from chair. 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 Prior to hospital admission, pt was mod indep with ADL and mobility at his ILF. He was able to manage meds, light meal prep, driving, and groceries. Currently pt demonstrates impairments as described below (See OT problem list) which functionally limit his ability to perform ADL/self-care tasks. Pt currently requires CGA-MOD A for ADL transfers with posterior lean, VC for RW mgt/safety, MOD A for LB ADL tasks. Pt educated in falls prevention, home/routines modifications, activity pacing for ADL and to gradually increase activity, RW mgt, ADL transfer training. Pt will benefit from AE/DME for LB ADL to minimize bending/increased dizziness.   Pt would benefit from skilled OT services to address noted impairments and functional limitations (see below for any additional details) in order to maximize safety and independence while minimizing falls risk and caregiver burden. Upon hospital discharge, recommend STR to maximize pt safety and return to PLOF.    Recommendations for follow up therapy are one component of a multi-disciplinary discharge planning process, led by the attending physician.  Recommendations may be updated based on patient status, additional functional criteria and insurance authorization.   Follow Up Recommendations  Skilled nursing-short term rehab (<3 hours/day)    Assistance Recommended at Discharge Frequent or constant Supervision/Assistance  Functional Status Assessment   Patient has had a recent decline in their functional status and demonstrates the ability to make significant improvements in function in a reasonable and predictable amount of time.  Equipment Recommendations  BSC/3in1;Other (comment) (2WW, reacher)    Recommendations for Other Services       Precautions / Restrictions Precautions Precautions: Fall Restrictions Weight Bearing Restrictions: No      Mobility Bed Mobility Overal bed mobility: Needs Assistance Bed Mobility: Sit to Supine      Sit to supine: Min assist   General bed mobility comments: MIN A for BLE mgt, difficulty getting BLE back into bed    Transfers Overall transfer level: Needs assistance Equipment used: Rolling walker (2 wheels) Transfers: Sit to/from Stand Sit to Stand: Mod assist;Min assist;From elevated surface     Step pivot transfers: Min assist;Mod assist     General transfer comment: VC for anterior weight shift, hand placement, feet placement      Balance Overall balance assessment: Needs assistance Sitting-balance support: No upper extremity supported;Feet supported Sitting balance-Leahy Scale: Fair Sitting balance - Comments: close supervision<>CGA static sitting EOB   Standing balance support: Reliant on assistive device for balance;During functional activity Standing balance-Leahy Scale: Poor Standing balance comment: initially attempting to stand to urinate with LUE support on grab bar but unable to sustain requiring to sit                           ADL either performed or assessed with clinical judgement   ADL  General ADL Comments: MODA for LB ADL (pt endorses significantly more dizziness upon bending over), CGA-MIN-MOD A for ADL transfers, VC for RW mgt/transfers     Vision         Perception     Praxis      Pertinent Vitals/Pain Pain Assessment: Faces Faces Pain Scale: Hurts little more Pain  Location: R buttocks cheek Pain Descriptors / Indicators: Aching Pain Intervention(s): Limited activity within patient's tolerance;Monitored during session;Repositioned;RN gave pain meds during session     Hand Dominance     Extremity/Trunk Assessment Upper Extremity Assessment Upper Extremity Assessment: Generalized weakness   Lower Extremity Assessment Lower Extremity Assessment: Generalized weakness       Communication Communication Communication: No difficulties   Cognition Arousal/Alertness: Awake/alert Behavior During Therapy: Flat affect Overall Cognitive Status: No family/caregiver present to determine baseline cognitive functioning                                 General Comments: alert and oriented, decr safety awareness/problem solving     General Comments       Exercises Other Exercises Other Exercises: Pt educated in falls prevention, home/routines modifications, activity pacing for ADL and to gradually increase activity, RW mgt, ADL transfer training   Shoulder Instructions      Home Living Family/patient expects to be discharged to:: Other (Comment) (Independent Living apartment)                                 Additional Comments: Pt reports being in independent living, provides 1 meal a day      Prior Functioning/Environment Prior Level of Function : Independent/Modified Independent;Driving             Mobility Comments: Mod I with rollator ADLs Comments: indep with ADL, lightmeal prep, driving, groceries, denies falls        OT Problem List: Decreased strength;Decreased safety awareness;Decreased activity tolerance;Impaired balance (sitting and/or standing);Decreased knowledge of use of DME or AE      OT Treatment/Interventions: Self-care/ADL training;Therapeutic exercise;Therapeutic activities;DME and/or AE instruction;Patient/family education;Energy conservation;Balance training    OT Goals(Current goals can  be found in the care plan section) Acute Rehab OT Goals Patient Stated Goal: go home OT Goal Formulation: With patient Time For Goal Achievement: 05/29/21 Potential to Achieve Goals: Good ADL Goals Pt Will Perform Lower Body Dressing: with modified independence;with adaptive equipment;sit to/from stand Pt Will Transfer to Toilet: with modified independence;ambulating (elevated commode, LRAD PRN) Additional ADL Goal #1: Pt will verbalize plan to implement at least 2 learned falls prevention strategies to maximize safety during ADL/IADL.  OT Frequency: Min 2X/week   Barriers to D/C:            Co-evaluation              AM-PAC OT "6 Clicks" Daily Activity     Outcome Measure Help from another person eating meals?: None Help from another person taking care of personal grooming?: A Little Help from another person toileting, which includes using toliet, bedpan, or urinal?: A Little Help from another person bathing (including washing, rinsing, drying)?: A Lot Help from another person to put on and taking off regular upper body clothing?: A Little Help from another person to put on and taking off regular lower body clothing?: A Lot 6 Click Score: 17   End of Session Equipment Utilized  During Treatment: Rolling walker (2 wheels)  Activity Tolerance: Patient tolerated treatment well Patient left: in bed;with call bell/phone within reach;with bed alarm set;with nursing/sitter in room  OT Visit Diagnosis: Other abnormalities of gait and mobility (R26.89);Muscle weakness (generalized) (M62.81);Dizziness and giddiness (R42)                Time: 3710-6269 OT Time Calculation (min): 27 min Charges:  OT General Charges $OT Visit: 1 Visit OT Evaluation $OT Eval Moderate Complexity: 1 Mod OT Treatments $Self Care/Home Management : 8-22 mins  Arman Filter., MPH, MS, OTR/L ascom [phone] 05/15/21, 2:55 PM

## 2021-05-15 NOTE — TOC CM/SW Note (Addendum)
TOC acknowledges consult for SNF placement. Will follow up after PT and OT have evaluated.  Alfonso Ramus, [place] [phone]

## 2021-05-16 DIAGNOSIS — E871 Hypo-osmolality and hyponatremia: Secondary | ICD-10-CM | POA: Diagnosis not present

## 2021-05-16 DIAGNOSIS — F39 Unspecified mood [affective] disorder: Secondary | ICD-10-CM | POA: Diagnosis not present

## 2021-05-16 DIAGNOSIS — U071 COVID-19: Secondary | ICD-10-CM | POA: Diagnosis not present

## 2021-05-16 LAB — GLUCOSE, CAPILLARY
Glucose-Capillary: 270 mg/dL — ABNORMAL HIGH (ref 70–99)
Glucose-Capillary: 271 mg/dL — ABNORMAL HIGH (ref 70–99)
Glucose-Capillary: 216 mg/dL — ABNORMAL HIGH (ref 70–99)
Glucose-Capillary: 243 mg/dL — ABNORMAL HIGH (ref 70–99)

## 2021-05-16 LAB — CBC
HCT: 42.1 % (ref 39.0–52.0)
Hemoglobin: 14.6 g/dL (ref 13.0–17.0)
MCH: 34.1 pg — ABNORMAL HIGH (ref 26.0–34.0)
MCHC: 34.7 g/dL (ref 30.0–36.0)
MCV: 98.4 fL (ref 80.0–100.0)
Platelets: 125 10*3/uL — ABNORMAL LOW (ref 150–400)
RBC: 4.28 MIL/uL (ref 4.22–5.81)
RDW: 12.9 % (ref 11.5–15.5)
WBC: 5.9 10*3/uL (ref 4.0–10.5)
nRBC: 0 % (ref 0.0–0.2)

## 2021-05-16 LAB — BASIC METABOLIC PANEL
Anion gap: 10 (ref 5–15)
BUN: 12 mg/dL (ref 8–23)
CO2: 23 mmol/L (ref 22–32)
Calcium: 8.9 mg/dL (ref 8.9–10.3)
Chloride: 99 mmol/L (ref 98–111)
Creatinine, Ser: 0.69 mg/dL (ref 0.61–1.24)
GFR, Estimated: >60 mL/min (ref >60)
Glucose, Bld: 211 mg/dL — ABNORMAL HIGH (ref 70–99)
Potassium: 3.7 mmol/L (ref 3.5–5.1)
Sodium: 132 mmol/L — ABNORMAL LOW (ref 135–145)

## 2021-05-16 MED ORDER — POLYETHYLENE GLYCOL 3350 17 G PO PACK
17.0000 g | PACK | Freq: Every day | ORAL | Status: DC
  Administered 2021-05-16 – 2021-05-18 (×3): 17 g via ORAL
  Filled 2021-05-16 (×4): qty 1

## 2021-05-16 NOTE — TOC CM/SW Note (Signed)
Patient on Airborne Isolation. Attempted call into room to discuss PT/OT recs for SNF.  No answer. Asked RN to ask patient to call me.  Alfonso Ramus, [place] [phone]

## 2021-05-16 NOTE — [place] FL2 (Signed)
[place] MEDICAID FL2 LEVEL OF CARE SCREENING TOOL     IDENTIFICATION  Patient Name: Hector Miller Birthdate: 1946-01-30 Sex: male Admission Date (Current Location): 05/14/2021  [facility] and [place] Number:  Chiropodist and Address:  [facility], [address] [place], [place] [address]      Provider Number: [id number]  Attending Physician Name and Address:  Charise Killian, MD  Relative Name and Phone Number:  Tyson Alias Clearence Cheek)   [phone] (Mobile)    Current Level of Care: Hospital Recommended Level of Care: Skilled Nursing Facility Prior Approval Number:    Date Approved/Denied:   PASRR Number: [id number]  Discharge Plan:      Current Diagnoses: Patient Active Problem List   Diagnosis Date Noted   COVID-19 virus infection 05/14/2021   Coronary artery disease 05/14/2021   Type 2 diabetes mellitus with hyperglycemia (HCC) 05/14/2021   Neuropathy 05/14/2021   Dizziness 05/14/2021   Weakness 05/14/2021   Mood disorder (HCC) 05/14/2021   Spinal stenosis, lumbar region, with neurogenic claudication 02/25/2021   Lumbar facet arthropathy 02/25/2021   Lumbar radiculopathy 02/25/2021   Chronic pain syndrome 02/25/2021   Gout 12/02/2014   HLD (hyperlipidemia) 12/02/2014   HTN (hypertension) 12/02/2014   Gastroesophageal reflux disease without esophagitis 12/02/2014   BPH (benign prostatic hyperplasia) 12/02/2014   Arthritis of back 12/02/2014   Frequent headaches 12/02/2014    Orientation RESPIRATION BLADDER Height & Weight     Self, Time, Situation, Place  Normal Continent Weight: 220 lb (99.8 kg) Height:  6' (182.9 cm)  BEHAVIORAL SYMPTOMS/MOOD NEUROLOGICAL BOWEL NUTRITION STATUS        Diet (carb modified)  AMBULATORY STATUS COMMUNICATION OF NEEDS Skin   Limited Assist Verbally Normal                       Personal Care Assistance Level of Assistance  Bathing, Feeding, Dressing Bathing Assistance:  Limited assistance Feeding assistance: Limited assistance Dressing Assistance: Limited assistance     Functional Limitations Info             SPECIAL CARE FACTORS FREQUENCY  PT (By licensed PT), OT (By licensed OT)     PT Frequency: 5 times per week OT Frequency: 5 times per week            Contractures      Additional Factors Info  Code Status, Allergies Code Status Info: DNR Allergies Info: NKA           Current Medications (05/16/2021):  This is the current hospital active medication list Current Facility-Administered Medications  Medication Dose Route Frequency Provider Last Rate Last Admin   acetaminophen (TYLENOL) tablet 650 mg  650 mg Oral Q6H PRN Alberteen Sam, MD   650 mg at 05/15/21 2032   Or   acetaminophen (TYLENOL) suppository 650 mg  650 mg Rectal Q6H PRN Danford, Earl Lites, MD       docusate sodium (COLACE) capsule 200 mg  200 mg Oral BID Charise Killian, MD   200 mg at 05/16/21 0926   enoxaparin (LOVENOX) injection 40 mg  40 mg Subcutaneous Q24H Alberteen Sam, MD   40 mg at 05/15/21 2031   insulin aspart (novoLOG) injection 0-15 Units  0-15 Units Subcutaneous TID WC Alberteen Sam, MD   8 Units at 05/16/21 0925   insulin aspart (novoLOG) injection 0-5 Units  0-5 Units Subcutaneous QHS Danford, Earl Lites, MD   2 Units at  05/15/21 2239   ipratropium-albuterol (DUONEB) 0.5-2.5 (3) MG/3ML nebulizer solution 3 mL  3 mL Nebulization Q6H PRN Charise Killian, MD       LORazepam (ATIVAN) injection 1 mg  1 mg Intravenous Q4H PRN Charise Killian, MD       mirtazapine (REMERON) tablet 30 mg  30 mg Oral QHS Alberteen Sam, MD   30 mg at 05/15/21 2032   nirmatrelvir/ritonavir EUA (PAXLOVID) 3 tablet  3 tablet Oral BID Alberteen Sam, MD   3 tablet at 05/16/21 0926   ondansetron (ZOFRAN) tablet 4 mg  4 mg Oral Q6H PRN Danford, Earl Lites, MD       Or   ondansetron (ZOFRAN) injection 4 mg  4 mg Intravenous  Q6H PRN Danford, Earl Lites, MD       ondansetron (ZOFRAN-ODT) disintegrating tablet 4 mg  4 mg Oral Q8H PRN Danford, Earl Lites, MD       pantoprazole (PROTONIX) EC tablet 40 mg  40 mg Oral Daily Alberteen Sam, MD   40 mg at 05/16/21 0926   pregabalin (LYRICA) capsule 50 mg  50 mg Oral QHS Alberteen Sam, MD   50 mg at 05/15/21 2032     Discharge Medications: Please see discharge summary for a list of discharge medications.  Relevant Imaging Results:  Relevant Lab Results:   Additional Information SS #: [id number]  Aleda Madl E Terence Googe, LCSW

## 2021-05-16 NOTE — TOC Initial Note (Addendum)
Transition of Care [facility]) - Initial/Assessment Note    Patient Details  Name: Hector Miller MRN: [id number] Date of Birth: 03-Dec-1945  [facility] of [facility]) CM/SW Contact:    Liliana Cline, LCSW Phone Number: 05/16/2021, 11:09 AM  Clinical Narrative:                 Spoke with patient via phone due to Airborne Isolation.  Patient lives at [facility] of [address] Patient drives himself to appointments.  PCP and Pharmacy is [facility]. Patient has a RW and cane that he uses as needed.  Discussed PT and OT recs for SNF. Patient is agreeable to SNF. Patient understands he would not be able to go to SNF until he completes a 10 day COVID quarantine at the hospital. Patient understands he may be stronger at that point to be able to return home with Home Health. Patient says he is agreeable to whatever is recommended at that point. No SNF or HH history. CSW will start SNF work up. Notified Cathy with [facility] of admission and possible SNF/HH needs via secure email.  Expected Discharge Plan: Skilled Nursing Facility Barriers to Discharge: Continued Medical Work up   Patient Goals and CMS Choice Patient states their goals for this hospitalization and ongoing recovery are:: SNF CMS Medicare.gov Compare Post Acute Care list provided to:: Patient Choice offered to / list presented to : Patient  Expected Discharge Plan and Services Expected Discharge Plan: Skilled Nursing Facility       Living arrangements for the past 2 months: Assisted Living Facility                                      Prior Living Arrangements/Services Living arrangements for the past 2 months: Assisted Living Facility Lives with:: Facility Resident Patient language and need for interpreter reviewed:: Yes Do you feel safe going back to the place where you live?: Yes      Need for Family Participation in Patient Care: Yes (Comment) Care giver support system in place?: Yes (comment) Current home  services: DME Criminal Activity/Legal Involvement Pertinent to Current Situation/Hospitalization: No - Comment as needed  Activities of Daily Living Home Assistive Devices/Equipment: None ADL Screening (condition at time of admission) Patient's cognitive ability adequate to safely complete daily activities?: Yes Is the patient deaf or have difficulty hearing?: No Does the patient have difficulty seeing, even when wearing glasses/contacts?: No Does the patient have difficulty concentrating, remembering, or making decisions?: No Patient able to express need for assistance with ADLs?: Yes Does the patient have difficulty dressing or bathing?: No Independently performs ADLs?: Yes (appropriate for developmental age) Does the patient have difficulty walking or climbing stairs?: Yes Weakness of Legs: Both Weakness of Arms/Hands: Both  Permission Sought/Granted Permission sought to share information with : Oceanographer granted to share information with : Yes, Verbal Permission Granted     Permission granted to share info w AGENCY: HH, DME, SNF, ALFs        Emotional Assessment       Orientation: : Oriented to Self, Oriented to Place, Oriented to  Time, Oriented to Situation Alcohol / Substance Use: Not Applicable Psych Involvement: No (comment)  Admission diagnosis:  Hyperglycemia [R73.9] Troponin I above reference range [R77.8] Chest pain [R07.9] COVID [U07.1] COVID-19 virus infection [U07.1] Patient Active Problem List   Diagnosis Date Noted   COVID-19 virus  infection 05/14/2021   Coronary artery disease 05/14/2021   Type 2 diabetes mellitus with hyperglycemia (HCC) 05/14/2021   Neuropathy 05/14/2021   Dizziness 05/14/2021   Weakness 05/14/2021   Mood disorder (HCC) 05/14/2021   Spinal stenosis, lumbar region, with neurogenic claudication 02/25/2021   Lumbar facet arthropathy 02/25/2021   Lumbar radiculopathy 02/25/2021   Chronic pain syndrome  02/25/2021   Gout 12/02/2014   HLD (hyperlipidemia) 12/02/2014   HTN (hypertension) 12/02/2014   Gastroesophageal reflux disease without esophagitis 12/02/2014   BPH (benign prostatic hyperplasia) 12/02/2014   Arthritis of back 12/02/2014   Frequent headaches 12/02/2014   PCP:  Lorre Munroe, NP Pharmacy:   [id number] [place], [place] - [address] AT [facility] OF [address] [place] [address] [place] [address] Phone: [phone] Fax: [phone]     Social Determinants of Health (SDOH) Interventions    Readmission Risk Interventions Readmission Risk Prevention Plan 05/16/2021  Post Dischage Appt Complete  Medication Screening Complete  Transportation Screening Complete  Some recent data might be hidden

## 2021-05-16 NOTE — Progress Notes (Signed)
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 Ativan prn  Thrombocytopenia: likely secondary to alcohol abuse. Will continue to monitor    Hyponatremia: encourage po intake    Weakness: likely secondary to Gas. PT/OT recs SNF    DM2: HbA1c 10.6, poorly controlled. 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 Continue to hold losartan, lasix    HTN: hold home dose of losartan, lasix secondary to low normal BP    Neuropathy: maybe alcohol related as per PCP notes.Continue on home dose of pregabalin    Mood disorder: continue on home dose of remeron    Dizziness: chronic. 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   DVT prophylaxis: lovenox  Code Status: DNR Family Communication: discussed pt's care w/ pt's nephew, Gene [phone]) and answered his questions  Disposition Plan: possibly to SNF but pt was wants to think SNF over   Level of care: Med-Surg  Status is: Inpatient  Remains inpatient appropriate because: severity of illness     Consultants:    Procedures:   Antimicrobials:    Subjective: Pt c/o malaise.     Objective: Vitals:   05/16/21 0015 05/16/21 0426 05/16/21 0914 05/16/21 1221  BP: 108/74 116/60 123/77 110/65  Pulse: 87 88 93 87  Resp: 18 17 17 18   Temp:  97.7 F (36.5 C) (!) 97.5 F (36.4 C) 98.3 F (36.8 C) 98.1 F (36.7 C)  TempSrc: Oral Oral Oral Oral  SpO2: 95% 95% 94% 94%  Weight:      Height:       No intake or output data in the 24 hours ending 05/16/21 1319  Filed Weights   05/13/21 2106  Weight: 99.8 kg    Examination:  General exam: Appears comfortable  Respiratory system: decreased breath sounds b/l  Cardiovascular system: S1/S2+. No rubs or clicks  Gastrointestinal system: Abd is soft, NT, ND & normal bowel sounds  Central nervous system: Alert and oriented. Moves all extremities   Psychiatry: judgement and insight appears normal. Flat mood and affect     Data Reviewed: I have personally reviewed following labs and imaging studies  CBC: Recent Labs  Lab 05/13/21 2111 05/16/21 0653  WBC 8.9 5.9  HGB 16.5 14.6  HCT 47.5 42.1  MCV 98.3 98.4  PLT 128* 0000000*   Basic Metabolic Panel: Recent Labs  Lab 05/13/21 2111 05/16/21 0653  NA 130* 132*  K 4.0 3.7  CL 97* 99  CO2 23 23  GLUCOSE 250* 211*  BUN 14 12  CREATININE 0.94 0.69  CALCIUM 9.1 8.9   GFR: Estimated Creatinine Clearance: 96.1 mL/min (by C-G formula based on SCr of 0.69 mg/dL). Liver Function Tests: Recent Labs  Lab 05/13/21 2111  AST 36  ALT 22  ALKPHOS 78  BILITOT 1.2  PROT 7.6  ALBUMIN 3.9   Recent Labs  Lab 05/13/21 2111  LIPASE 47   No results for input(s): AMMONIA in the last 168 hours. Coagulation Profile: No results for input(s): INR, PROTIME in the last 168 hours. Cardiac Enzymes: No results for input(s): CKTOTAL, CKMB, CKMBINDEX, TROPONINI in the last 168 hours. BNP (last 3 results) No results for input(s): PROBNP in the last 8760 hours. HbA1C: No results for input(s): HGBA1C in the last 72 hours. CBG: Recent Labs  Lab 05/15/21 1231 05/15/21 1624 05/15/21 2229 05/16/21 0917 05/16/21 1226  GLUCAP 307* 223* 222* 270* 271*   Lipid Profile: No results for input(s): CHOL, HDL, LDLCALC, TRIG, CHOLHDL,  LDLDIRECT in the last 72 hours. Thyroid Function Tests: No results for input(s): TSH, T4TOTAL, FREET4, T3FREE, THYROIDAB in the last 72 hours. Anemia Panel: No results for input(s): VITAMINB12, FOLATE, FERRITIN, TIBC, IRON, RETICCTPCT in the last 72 hours. Sepsis Labs: No results for input(s): PROCALCITON, LATICACIDVEN in the last 168 hours.  Recent Results (from the past 240 hour(s))  Resp Panel by RT-PCR (Flu A&B, Covid) Nasopharyngeal Swab     Status: Abnormal   Collection Time: 05/13/21  9:11 PM   Specimen: Nasopharyngeal Swab; Nasopharyngeal(NP) swabs in vial transport medium  Result Value Ref Range Status   SARS Coronavirus 2 by RT PCR POSITIVE (A) NEGATIVE Final    Comment: (NOTE) SARS-CoV-2 target nucleic acids are DETECTED.  The SARS-CoV-2 RNA is generally detectable in upper respiratory specimens during the acute phase of infection. Positive results are indicative of the presence of the identified virus, but do not rule out bacterial infection or co-infection with other pathogens not detected by the test. Clinical correlation with patient history and other diagnostic information is necessary to determine patient infection status. The expected result is Negative.  Fact Sheet for Patients: EntrepreneurPulse.com.au  Fact Sheet for Healthcare Providers: IncredibleEmployment.be  This test is not yet approved or cleared by the [place] FDA and  has been authorized for detection and/or diagnosis of SARS-CoV-2 by FDA under an Emergency Use Authorization (EUA).  This EUA will remain in effect (meaning this test can be used) for the duration of  the COVID-19 declaration under Section 564(b)(1) of the A ct, 21 U.S.C. section 360bbb-3(b)(1), unless the authorization is terminated or revoked sooner.     Influenza A by PCR NEGATIVE NEGATIVE Final   Influenza B by PCR NEGATIVE NEGATIVE Final    Comment: (NOTE) The Xpert Xpress  SARS-CoV-2/FLU/RSV plus assay is intended as an aid in the diagnosis of influenza from Nasopharyngeal swab specimens and should not be used as a sole basis for treatment. Nasal washings and aspirates are unacceptable for Xpert Xpress SARS-CoV-2/FLU/RSV testing.  Fact Sheet for Patients: EntrepreneurPulse.com.au  Fact Sheet for Healthcare Providers: IncredibleEmployment.be  This test is not yet approved or cleared by the [place] FDA and has been authorized for detection and/or diagnosis of SARS-CoV-2 by FDA under an Emergency Use Authorization (EUA). This EUA will remain in effect (meaning this test can be used) for the duration of the COVID-19 declaration under Section 564(b)(1) of the Act, 21 U.S.C. section 360bbb-3(b)(1), unless the authorization is terminated or revoked.  Performed at [facility], [address], [place], [place] [address]          Radiology Studies: No results found.      Scheduled Meds:  docusate sodium  200 mg Oral BID   enoxaparin (LOVENOX) injection  40 mg Subcutaneous Q24H   insulin aspart  0-15 Units Subcutaneous TID WC   insulin aspart  0-5 Units Subcutaneous QHS   mirtazapine  30 mg Oral QHS   nirmatrelvir/ritonavir EUA  3 tablet Oral BID   pantoprazole  40 mg Oral Daily   polyethylene glycol  17 g Oral Daily   pregabalin  50 mg Oral QHS   Continuous Infusions:   LOS: 2 days    Time spent: 25  mins     Wyvonnia Dusky, MD [facility] Pager [phone]  If 7PM-7AM, please contact night-coverage 05/16/2021, 1:19 PM

## 2021-05-17 DIAGNOSIS — R531 Weakness: Secondary | ICD-10-CM | POA: Diagnosis not present

## 2021-05-17 DIAGNOSIS — U071 COVID-19: Secondary | ICD-10-CM | POA: Diagnosis not present

## 2021-05-17 DIAGNOSIS — F39 Unspecified mood [affective] disorder: Secondary | ICD-10-CM | POA: Diagnosis not present

## 2021-05-17 LAB — GLUCOSE, CAPILLARY
Glucose-Capillary: 216 mg/dL — ABNORMAL HIGH (ref 70–99)
Glucose-Capillary: 236 mg/dL — ABNORMAL HIGH (ref 70–99)
Glucose-Capillary: 229 mg/dL — ABNORMAL HIGH (ref 70–99)
Glucose-Capillary: 288 mg/dL — ABNORMAL HIGH (ref 70–99)

## 2021-05-17 LAB — CBC
HCT: 41 % (ref 39.0–52.0)
Hemoglobin: 14.2 g/dL (ref 13.0–17.0)
MCH: 33.6 pg (ref 26.0–34.0)
MCHC: 34.6 g/dL (ref 30.0–36.0)
MCV: 97.2 fL (ref 80.0–100.0)
Platelets: 140 10*3/uL — ABNORMAL LOW (ref 150–400)
RBC: 4.22 MIL/uL (ref 4.22–5.81)
RDW: 12.7 % (ref 11.5–15.5)
WBC: 5.7 10*3/uL (ref 4.0–10.5)
nRBC: 0 % (ref 0.0–0.2)

## 2021-05-17 LAB — BASIC METABOLIC PANEL WITH GFR
Anion gap: 8 (ref 5–15)
BUN: 13 mg/dL (ref 8–23)
CO2: 25 mmol/L (ref 22–32)
Calcium: 9 mg/dL (ref 8.9–10.3)
Chloride: 102 mmol/L (ref 98–111)
Creatinine, Ser: 0.74 mg/dL (ref 0.61–1.24)
GFR, Estimated: >60 mL/min
Glucose, Bld: 209 mg/dL — ABNORMAL HIGH (ref 70–99)
Potassium: 3.6 mmol/L (ref 3.5–5.1)
Sodium: 135 mmol/L (ref 135–145)

## 2021-05-17 NOTE — TOC Progression Note (Signed)
Transition of Care [facility]) - Progression Note    Patient Details  Name: Hector Miller MRN: [id number] Date of Birth: 07-06-1945  [facility] of [facility]) CM/SW Contact  Margarito Liner, LCSW Phone Number: 05/17/2021, 9:15 AM  Clinical Narrative:   No bed offers this morning. Sent therapy notes to try and trigger responses.  Expected Discharge Plan: Skilled Nursing Facility Barriers to Discharge: Continued Medical Work up  Expected Discharge Plan and Services Expected Discharge Plan: Skilled Nursing Facility       Living arrangements for the past 2 months: Assisted Living Facility                                       Social Determinants of Health (SDOH) Interventions    Readmission Risk Interventions Readmission Risk Prevention Plan 05/16/2021  Post Dischage Appt Complete  Medication Screening Complete  Transportation Screening Complete  Some recent data might be hidden

## 2021-05-17 NOTE — Progress Notes (Signed)
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 Ativan prn. Pt has no interest in stopping drinking alcohol currently   Thrombocytopenia: likely secondary to alcohol abuse. Will continue to monitor    Hyponatremia: resolved   Weakness: likely secondary to COVID19. PT/OT recs SNF    DM2: poorly controlled, HbA1c 10.6. 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 Continue to hold losartan, lasix    HTN: continue to hold home dose of losartan, lasix secondary to low normal BP    Neuropathy: maybe alcohol related as per PCP notes. Continue on home dose of pregabalin    Mood disorder: continue on home dose of remeron    Dizziness: chronic. 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   DVT prophylaxis: lovenox  Code Status: DNR Family Communication:  Disposition Plan: pt is agreeable to SNF, waiting on SNF placement   Level of care: Med-Surg  Status is: Inpatient  Remains inpatient appropriate because: severity of illness     Consultants:    Procedures:   Antimicrobials:    Subjective: Pt c/o generalized weakness  Objective: Vitals:   05/16/21 1221 05/16/21 1639 05/16/21 2013 05/17/21 0454  BP: 110/65 (!) 110/55 (!) 117/58 134/70  Pulse: 87 83 86 92  Resp: 18 16 18 18   Temp: 98.1 F  (36.7 C) 97.6 F (36.4 C) 98 F (36.7 C) (!) 97.5 F (36.4 C)  TempSrc: Oral  Oral Oral  SpO2: 94% 95% 95% 91%  Weight:      Height:       No intake or output data in the 24 hours ending 05/17/21 0733  Filed Weights   05/13/21 2106  Weight: 99.8 kg    Examination:  General exam: Appears calm & comfortable  Respiratory system: diminished breath sounds b/l. No rales  Cardiovascular system: S1&S2+. No clicks or rubs  Gastrointestinal system: Abd is soft, NT, ND & hypoactive bowel sounds  Central nervous system: alert and oriented. Moves all extremities   Psychiatry: judgement and insight appears normal. Flat mood and affect      Data Reviewed: I have personally reviewed following labs and imaging studies  CBC: Recent Labs  Lab 05/13/21 2111 05/16/21 0653 05/17/21 0430  WBC 8.9 5.9 5.7  HGB 16.5 14.6 14.2  HCT 47.5 42.1 41.0  MCV 98.3 98.4 97.2  PLT 128* 125* 140*   Basic Metabolic Panel: Recent Labs  Lab 05/13/21 2111 05/16/21 0653 05/17/21 0430  NA 130* 132* 135  K 4.0 3.7 3.6  CL 97* 99 102  CO2 23 23 25   GLUCOSE 250* 211* 209*  BUN 14 12 13   CREATININE 0.94 0.69 0.74  CALCIUM 9.1 8.9 9.0   GFR: Estimated Creatinine Clearance: 96.1 mL/min (by C-G formula based on SCr of 0.74 mg/dL). Liver Function Tests:  Recent Labs  Lab 05/13/21 2111  AST 36  ALT 22  ALKPHOS 78  BILITOT 1.2  PROT 7.6  ALBUMIN 3.9   Recent Labs  Lab 05/13/21 2111  LIPASE 47   No results for input(s): AMMONIA in the last 168 hours. Coagulation Profile: No results for input(s): INR, PROTIME in the last 168 hours. Cardiac Enzymes: No results for input(s): CKTOTAL, CKMB, CKMBINDEX, TROPONINI in the last 168 hours. BNP (last 3 results) No results for input(s): PROBNP in the last 8760 hours. HbA1C: No results for input(s): HGBA1C in the last 72 hours. CBG: Recent Labs  Lab 05/15/21 2229 05/16/21 0917 05/16/21 1226 05/16/21 1634 05/16/21 2016  GLUCAP 222* 270* 271*  216* 243*   Lipid Profile: No results for input(s): CHOL, HDL, LDLCALC, TRIG, CHOLHDL, LDLDIRECT in the last 72 hours. Thyroid Function Tests: No results for input(s): TSH, T4TOTAL, FREET4, T3FREE, THYROIDAB in the last 72 hours. Anemia Panel: No results for input(s): VITAMINB12, FOLATE, FERRITIN, TIBC, IRON, RETICCTPCT in the last 72 hours. Sepsis Labs: No results for input(s): PROCALCITON, LATICACIDVEN in the last 168 hours.  Recent Results (from the past 240 hour(s))  Resp Panel by RT-PCR (Flu A&B, Covid) Nasopharyngeal Swab     Status: Abnormal   Collection Time: 05/13/21  9:11 PM   Specimen: Nasopharyngeal Swab; Nasopharyngeal(NP) swabs in vial transport medium  Result Value Ref Range Status   SARS Coronavirus 2 by RT PCR POSITIVE (A) NEGATIVE Final    Comment: (NOTE) SARS-CoV-2 target nucleic acids are DETECTED.  The SARS-CoV-2 RNA is generally detectable in upper respiratory specimens during the acute phase of infection. Positive results are indicative of the presence of the identified virus, but do not rule out bacterial infection or co-infection with other pathogens not detected by the test. Clinical correlation with patient history and other diagnostic information is necessary to determine patient infection status. The expected result is Negative.  Fact Sheet for Patients: BloggerCourse.com  Fact Sheet for Healthcare Providers: SeriousBroker.it  This test is not yet approved or cleared by the [place] FDA and  has been authorized for detection and/or diagnosis of SARS-CoV-2 by FDA under an Emergency Use Authorization (EUA).  This EUA will remain in effect (meaning this test can be used) for the duration of  the COVID-19 declaration under Section 564(b)(1) of the A ct, 21 U.S.C. section 360bbb-3(b)(1), unless the authorization is terminated or revoked sooner.     Influenza A by PCR NEGATIVE NEGATIVE Final    Influenza B by PCR NEGATIVE NEGATIVE Final    Comment: (NOTE) The Xpert Xpress SARS-CoV-2/FLU/RSV plus assay is intended as an aid in the diagnosis of influenza from Nasopharyngeal swab specimens and should not be used as a sole basis for treatment. Nasal washings and aspirates are unacceptable for Xpert Xpress SARS-CoV-2/FLU/RSV testing.  Fact Sheet for Patients: BloggerCourse.com  Fact Sheet for Healthcare Providers: SeriousBroker.it  This test is not yet approved or cleared by the [place] FDA and has been authorized for detection and/or diagnosis of SARS-CoV-2 by FDA under an Emergency Use Authorization (EUA). This EUA will remain in effect (meaning this test can be used) for the duration of the COVID-19 declaration under Section 564(b)(1) of the Act, 21 U.S.C. section 360bbb-3(b)(1), unless the authorization is terminated or revoked.  Performed at [facility], [address], [place], [place] [address]          Radiology Studies: No results found.      Scheduled Meds:  docusate sodium  200 mg Oral BID   enoxaparin (LOVENOX) injection  40 mg Subcutaneous Q24H   insulin aspart  0-15 Units Subcutaneous TID WC   insulin aspart  0-5 Units Subcutaneous QHS   mirtazapine  30 mg Oral QHS   nirmatrelvir/ritonavir EUA  3 tablet Oral BID   pantoprazole  40 mg Oral Daily   polyethylene glycol  17 g Oral Daily   pregabalin  50 mg Oral QHS   Continuous Infusions:   LOS: 3 days    Time spent: 15  mins     Charise Killian, MD [facility] Pager [phone]  If 7PM-7AM, please contact night-coverage 05/17/2021, 7:33 AM

## 2021-05-18 DIAGNOSIS — G629 Polyneuropathy, unspecified: Secondary | ICD-10-CM

## 2021-05-18 LAB — BASIC METABOLIC PANEL
Anion gap: 7 (ref 5–15)
BUN: 12 mg/dL (ref 8–23)
CO2: 28 mmol/L (ref 22–32)
Calcium: 8.8 mg/dL — ABNORMAL LOW (ref 8.9–10.3)
Chloride: 103 mmol/L (ref 98–111)
Creatinine, Ser: 0.83 mg/dL (ref 0.61–1.24)
GFR, Estimated: >60 mL/min (ref >60)
Glucose, Bld: 203 mg/dL — ABNORMAL HIGH (ref 70–99)
Potassium: 4.2 mmol/L (ref 3.5–5.1)
Sodium: 138 mmol/L (ref 135–145)

## 2021-05-18 LAB — CBC
HCT: 42.2 % (ref 39.0–52.0)
Hemoglobin: 14.2 g/dL (ref 13.0–17.0)
MCH: 33 pg (ref 26.0–34.0)
MCHC: 33.6 g/dL (ref 30.0–36.0)
MCV: 98.1 fL (ref 80.0–100.0)
Platelets: 161 10*3/uL (ref 150–400)
RBC: 4.3 MIL/uL (ref 4.22–5.81)
RDW: 12.6 % (ref 11.5–15.5)
WBC: 5 10*3/uL (ref 4.0–10.5)
nRBC: 0 % (ref 0.0–0.2)

## 2021-05-18 LAB — GLUCOSE, CAPILLARY
Glucose-Capillary: 221 mg/dL — ABNORMAL HIGH (ref 70–99)
Glucose-Capillary: 305 mg/dL — ABNORMAL HIGH (ref 70–99)
Glucose-Capillary: 355 mg/dL — ABNORMAL HIGH (ref 70–99)
Glucose-Capillary: 195 mg/dL — ABNORMAL HIGH (ref 70–99)

## 2021-05-18 NOTE — Progress Notes (Signed)
PROGRESS NOTE   HPI was taken from Dr. Maryfrances Bunnellanford: [name] is a 76 y.o. 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   Patient was in usual state of health until day of admission when he was in a public area of his ILF, couldn't stand, and so someone activated EMS.  He had no fever, cough.  He has chronic nausea, but had also vomited a few times this week.   In the ER here, vitals unremarkable.  CXR clear.  SpO2 93-95% on room air.  COVID+.  Electrolytes and renal function normal.   Couldn't stand, and so EDP asked for observation overnight for PT eval and possible rehab.    Hospital course as per Dr. Mayford KnifeWilliams 05/15/21-05/18/21: Pt was found to have COVID19 and is on paxlovid and bronchodilators. Of note, pt also present w/ generalized weakness. PT/OT evaluated pt and recommended SNF initially and now recs HH. HH orders placed and CM will set up. 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    Hector Miller  [id number] DOB: 04/21/1946 DOA: 05/14/2021 PCP: Lorre MunroeBaity, Regina W, NP    Assessment & Plan:   Principal Problem:   COVID-19 virus infection Active Problems:   HTN (hypertension)   Spinal stenosis, lumbar region, with neurogenic claudication   Coronary artery disease   Type 2 diabetes mellitus with hyperglycemia (HCC)   Neuropathy   Dizziness   Weakness   Mood disorder (HCC)   COVID-19: continue on paxlovid, bronchodilators & encourage incentive spirometry   Alcohol abuse: significant abuse as per pt's nephew. Ativan prn. Pt has no interest in stopping drinking alcohol currently   Thrombocytopenia: resolved   Hyponatremia: resolved   Weakness: likely secondary to COVID19. PT/OT recs SNF    DM2: HbA1c 10.6, poorly controlled. Continue on SSI w/  accuchecks   Hx of CAD: s/p CABG in 2014 & aortic valve repair. Continue to hold losartan, lasix    HTN: continue to hold all home anti-HTN meds as BP is low normal sstill   Neuropathy: maybe alcohol related as per PCP notes. Continue on home dose of pregabalin    Mood disorder: continue on home dose of remeron    Dizziness: chronic. 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   DVT prophylaxis: lovenox  Code Status: DNR Family Communication:  discussed pt's care w/ pt's nephew, Hector Miller [phone]) and answered his questions  Disposition Plan: likely d/c home tomorrow w/ HH. HH orders placed this afternoon   Level of care: Med-Surg  Status is: Inpatient  Remains inpatient appropriate because: can likely be d/c tomorrow      Consultants:    Procedures:   Antimicrobials:    Subjective: Pt c/o fatigue and intermittent dizziness   Objective: Vitals:   05/17/21 1218 05/17/21 1954 05/18/21 0513 05/18/21 0755  BP: 109/74 (!) 123/58 111/73 130/75  Pulse: 78 83 74 77  Resp: 16 20 20 16   Temp: 98.2 F (36.8 C) 98.2 F (36.8 C) 98.4 F (36.9 C) 98 F (36.7 C)  TempSrc:      SpO2: 95% 93% 95% 94%  Weight:      Height:  No intake or output data in the 24 hours ending 05/18/21 0805  Filed Weights   05/13/21 2106  Weight: 99.8 kg    Examination:  General exam: Appears comfortable  Respiratory system: decreased breath sounds b/l  Cardiovascular system: S1/S2+. No rubs or clicks   Gastrointestinal system: Abd is soft, NT, ND & normal bowel sounds  Central nervous system: alert and oriented. Moves all extremities  Psychiatry: judgement and insight appears normal. Flat mood and affect      Data Reviewed: I have personally reviewed following labs and imaging studies  CBC: Recent Labs  Lab 05/13/21 2111 05/16/21 0653  05/17/21 0430 05/18/21 0517  WBC 8.9 5.9 5.7 5.0  HGB 16.5 14.6 14.2 14.2  HCT 47.5 42.1 41.0 42.2  MCV 98.3 98.4 97.2 98.1  PLT 128* 125* 140* 161   Basic Metabolic Panel: Recent Labs  Lab 05/13/21 2111 05/16/21 0653 05/17/21 0430 05/18/21 0517  NA 130* 132* 135 138  K 4.0 3.7 3.6 4.2  CL 97* 99 102 103  CO2 23 23 25 28   GLUCOSE 250* 211* 209* 203*  BUN 14 12 13 12   CREATININE 0.94 0.69 0.74 0.83  CALCIUM 9.1 8.9 9.0 8.8*   GFR: Estimated Creatinine Clearance: 92.6 mL/min (by C-G formula based on SCr of 0.83 mg/dL). Liver Function Tests: Recent Labs  Lab 05/13/21 2111  AST 36  ALT 22  ALKPHOS 78  BILITOT 1.2  PROT 7.6  ALBUMIN 3.9   Recent Labs  Lab 05/13/21 2111  LIPASE 47   No results for input(s): AMMONIA in the last 168 hours. Coagulation Profile: No results for input(s): INR, PROTIME in the last 168 hours. Cardiac Enzymes: No results for input(s): CKTOTAL, CKMB, CKMBINDEX, TROPONINI in the last 168 hours. BNP (last 3 results) No results for input(s): PROBNP in the last 8760 hours. HbA1C: No results for input(s): HGBA1C in the last 72 hours. CBG: Recent Labs  Lab 05/16/21 2016 05/17/21 0804 05/17/21 1215 05/17/21 1614 05/17/21 2040  GLUCAP 243* 216* 236* 229* 288*   Lipid Profile: No results for input(s): CHOL, HDL, LDLCALC, TRIG, CHOLHDL, LDLDIRECT in the last 72 hours. Thyroid Function Tests: No results for input(s): TSH, T4TOTAL, FREET4, T3FREE, THYROIDAB in the last 72 hours. Anemia Panel: No results for input(s): VITAMINB12, FOLATE, FERRITIN, TIBC, IRON, RETICCTPCT in the last 72 hours. Sepsis Labs: No results for input(s): PROCALCITON, LATICACIDVEN in the last 168 hours.  Recent Results (from the past 240 hour(s))  Resp Panel by RT-PCR (Flu A&B, Covid) Nasopharyngeal Swab     Status: Abnormal   Collection Time: 05/13/21  9:11 PM   Specimen: Nasopharyngeal Swab; Nasopharyngeal(NP) swabs in vial transport medium  Result Value Ref  Range Status   SARS Coronavirus 2 by RT PCR POSITIVE (A) NEGATIVE Final    Comment: (NOTE) SARS-CoV-2 target nucleic acids are DETECTED.  The SARS-CoV-2 RNA is generally detectable in upper respiratory specimens during the acute phase of infection. Positive results are indicative of the presence of the identified virus, but do not rule out bacterial infection or co-infection with other pathogens not detected by the test. Clinical correlation with patient history and other diagnostic information is necessary to determine patient infection status. The expected result is Negative.  Fact Sheet for Patients: 07/15/21  Fact Sheet for Healthcare Providers: 05/15/21  This test is not yet approved or cleared by the [place] FDA and  has been authorized for detection and/or diagnosis of SARS-CoV-2 by FDA under an Emergency Use Authorization (EUA).  This EUA will remain in effect (meaning this test can be used) for the duration of  the COVID-19 declaration under Section 564(b)(1) of the A ct, 21 U.S.C. section 360bbb-3(b)(1), unless the authorization is terminated or revoked sooner.     Influenza A by PCR NEGATIVE NEGATIVE Final   Influenza B by PCR NEGATIVE NEGATIVE Final    Comment: (NOTE) The Xpert Xpress SARS-CoV-2/FLU/RSV plus assay is intended as an aid in the diagnosis of influenza from Nasopharyngeal swab specimens and should not be used as a sole basis for treatment. Nasal washings and aspirates are unacceptable for Xpert Xpress SARS-CoV-2/FLU/RSV testing.  Fact Sheet for Patients: BloggerCourse.com  Fact Sheet for Healthcare Providers: SeriousBroker.it  This test is not yet approved or cleared by the [place] FDA and has been authorized for detection and/or diagnosis of SARS-CoV-2 by FDA under an Emergency Use Authorization (EUA). This EUA will  remain in effect (meaning this test can be used) for the duration of the COVID-19 declaration under Section 564(b)(1) of the Act, 21 U.S.C. section 360bbb-3(b)(1), unless the authorization is terminated or revoked.  Performed at [facility], [address], [place], [place] [address]          Radiology Studies: No results found.      Scheduled Meds:  docusate sodium  200 mg Oral BID   enoxaparin (LOVENOX) injection  40 mg Subcutaneous Q24H   insulin aspart  0-15 Units Subcutaneous TID WC   insulin aspart  0-5 Units Subcutaneous QHS   mirtazapine  30 mg Oral QHS   nirmatrelvir/ritonavir EUA  3 tablet Oral BID   pantoprazole  40 mg Oral Daily   polyethylene glycol  17 g Oral Daily   pregabalin  50 mg Oral QHS   Continuous Infusions:   LOS: 4 days    Time spent: 15  mins     Charise Killian, MD [facility] Pager [phone]  If 7PM-7AM, please contact night-coverage 05/18/2021, 8:05 AM

## 2021-05-18 NOTE — Progress Notes (Signed)
Physical Therapy Treatment Patient Details Name: Hector Miller MRN: [id number] DOB: 31-Jan-1946 Today's Date: 05/18/2021   History of Present Illness Pt is a 76 y/o M admitted on 05/14/21 with c/c of weakness & inability to stand from chair. 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 Due to improvement in mobility with decreased assistance required, pt requesting to go home at time of discharge. Able to mobilize in hallway with RW safely. Able to stand without AD, however does require use of B UE support. 5 time sit<>Stand in 12 seconds demonstrating adequate power/strength and decreased falls risk. Updated care team of recommendations. Will continue to progress as able.   Recommendations for follow up therapy are one component of a multi-disciplinary discharge planning process, led by the attending physician.  Recommendations may be updated based on patient status, additional functional criteria and insurance authorization.  Follow Up Recommendations  Home health PT     Assistance Recommended at Discharge PRN  Patient can return home with the following     Equipment Recommendations  Rolling walker (2 wheels)    Recommendations for Other Services       Precautions / Restrictions Precautions Precautions: Fall Restrictions Weight Bearing Restrictions: No     Mobility  Bed Mobility Overal bed mobility: Modified Independent Bed Mobility: Supine to Sit     Supine to sit: Modified independent (Device/Increase time)     General bed mobility comments: safe technique with ability to sit at EOB safely    Transfers Overall transfer level: Needs assistance Equipment used: None Transfers: Sit to/from Stand Sit to Stand:  Supervision           General transfer comment: safe technique with upright posture. No AD required for initial stand, however then uses RW for balance    Ambulation/Gait Ambulation/Gait assistance: Min guard Gait Distance (Feet): 300 Feet Assistive device: Rolling walker (2 wheels) Gait Pattern/deviations: Step-through pattern       General Gait Details: ambulated around twice around RN station. RW used and pt able to carry conversation during mobility efforts. No fatigue reported. O2 sats WNL with exertion   Stairs             Wheelchair Mobility    Modified Rankin (Stroke Patients Only)       Balance Overall balance assessment: Needs assistance Sitting-balance support: No upper extremity supported;Feet supported Sitting balance-Leahy Scale: Good     Standing balance support: Bilateral upper extremity supported Standing balance-Leahy Scale: Fair                              Cognition Arousal/Alertness: Awake/alert Behavior During Therapy: WFL for tasks assessed/performed Overall Cognitive Status: Within Functional Limits for tasks assessed                                          Exercises Other Exercises Other Exercises: performed 5x sit<>Stand with B hands required. Pt able to complete in 12 seconds. Other Exercises: supine ther-ex performed on B LE including quad sets, SLRs, hip abd/add, and heel slides. 10 reps performed with supervision  General Comments        Pertinent Vitals/Pain Pain Assessment: No/denies pain    Home Living                          Prior Function            PT Goals (current goals can now be found in the care plan section) Acute Rehab PT Goals Patient Stated Goal: get out of the hospital PT Goal Formulation: With patient Time For Goal Achievement: 05/29/21 Potential to Achieve Goals: Good Progress towards PT goals: Progressing toward goals    Frequency    Min  2X/week      PT Plan Discharge plan needs to be updated    Co-evaluation              AM-PAC PT "6 Clicks" Mobility   Outcome Measure  Help needed turning from your back to your side while in a flat bed without using bedrails?: None Help needed moving from lying on your back to sitting on the side of a flat bed without using bedrails?: None Help needed moving to and from a bed to a chair (including a wheelchair)?: A Little Help needed standing up from a chair using your arms (e.g., wheelchair or bedside chair)?: A Little Help needed to walk in hospital room?: A Little Help needed climbing 3-5 steps with a railing? : A Little 6 Click Score: 20    End of Session Equipment Utilized During Treatment: Gait belt Activity Tolerance: Patient tolerated treatment well Patient left: in chair;with chair alarm set;with call bell/phone within reach Nurse Communication: Mobility status PT Visit Diagnosis: Unsteadiness on feet (R26.81);Difficulty in walking, not elsewhere classified (R26.2);Muscle weakness (generalized) (M62.81)     Time: 1194-1740 PT Time Calculation (min) (ACUTE ONLY): 24 min  Charges:  $Gait Training: 8-22 mins $Therapeutic Exercise: 8-22 mins                     Elizabeth Palau, PT, DPT [phone]    Hector Miller 05/18/2021, 2:11 PM

## 2021-05-18 NOTE — Progress Notes (Signed)
Inpatient Diabetes Program Recommendations  AACE/ADA: New Consensus Statement on Inpatient Glycemic Control (2015)  Target Ranges:  Prepandial:   less than 140 mg/dL      Peak postprandial:   less than 180 mg/dL (1-2 hours)      Critically ill patients:  140 - 180 mg/dL    Latest Reference Range & Units 05/16/21 09:17 05/16/21 12:26 05/16/21 16:34 05/16/21 20:16  Glucose-Capillary 70 - 99 mg/dL 944 (H) 967 (H) 591 (H) 243 (H)    Latest Reference Range & Units 05/17/21 08:04 05/17/21 12:15 05/17/21 16:14 05/17/21 20:40  Glucose-Capillary 70 - 99 mg/dL 638 (H) 466 (H) 599 (H) 288 (H)    Latest Reference Range & Units 05/18/21 07:53  Glucose-Capillary 70 - 99 mg/dL 357 (H)    Admit with: COVID-19  History: DM  Home DM Meds: Metformin 1000 mg BID  Current Orders: Novolog Moderate Correction Scale/ SSI (0-15 units) TID AC + HS     MD- Note AM CBGs have been elevated >200 for the last 3 days  Please consider starting Semglee 10 units daily (0.1 units/kg)    --Will follow patient during hospitalization--  Ambrose Finland RN, MSN, CDE Diabetes Coordinator Inpatient Glycemic Control Team Team Pager: [phone] (8a-5p)

## 2021-05-19 LAB — CBC
HCT: 41.3 % (ref 39.0–52.0)
Hemoglobin: 13.8 g/dL (ref 13.0–17.0)
MCH: 33.3 pg (ref 26.0–34.0)
MCHC: 33.4 g/dL (ref 30.0–36.0)
MCV: 99.5 fL (ref 80.0–100.0)
Platelets: 184 10*3/uL (ref 150–400)
RBC: 4.15 MIL/uL — ABNORMAL LOW (ref 4.22–5.81)
RDW: 12.7 % (ref 11.5–15.5)
WBC: 4.9 10*3/uL (ref 4.0–10.5)
nRBC: 0 % (ref 0.0–0.2)

## 2021-05-19 LAB — BASIC METABOLIC PANEL
Anion gap: 5 (ref 5–15)
BUN: 11 mg/dL (ref 8–23)
CO2: 29 mmol/L (ref 22–32)
Calcium: 8.6 mg/dL — ABNORMAL LOW (ref 8.9–10.3)
Chloride: 104 mmol/L (ref 98–111)
Creatinine, Ser: 0.71 mg/dL (ref 0.61–1.24)
GFR, Estimated: >60 mL/min (ref >60)
Glucose, Bld: 207 mg/dL — ABNORMAL HIGH (ref 70–99)
Potassium: 4.1 mmol/L (ref 3.5–5.1)
Sodium: 138 mmol/L (ref 135–145)

## 2021-05-19 LAB — GLUCOSE, CAPILLARY
Glucose-Capillary: 221 mg/dL — ABNORMAL HIGH (ref 70–99)
Glucose-Capillary: 360 mg/dL — ABNORMAL HIGH (ref 70–99)

## 2021-05-19 MED ORDER — POLYETHYLENE GLYCOL 3350 17 G PO PACK: 17.0000 g | PACK | Freq: Every day | ORAL | 0 refills | Status: DC

## 2021-05-19 MED ORDER — BENZONATATE 100 MG PO CAPS: 200.0000 mg | ORAL_CAPSULE | Freq: Three times a day (TID) | ORAL | 0 refills | Status: AC | PRN

## 2021-05-19 MED ORDER — ALBUTEROL SULFATE HFA 108 (90 BASE) MCG/ACT IN AERS: 2.0000 | INHALATION_SPRAY | Freq: Four times a day (QID) | RESPIRATORY_TRACT | 0 refills | Status: AC | PRN

## 2021-05-19 NOTE — TOC Progression Note (Addendum)
Transition of Care [facility]) - Progression Note    Patient Details  Name: Hector Miller MRN: [id number] Date of Birth: [birth year]  Transition of [facility]) CM/SW Contact  Margarito Liner, LCSW Phone Number: 05/19/2021, 10:20 AM  Clinical Narrative:  PT has upgraded recommendations to home health. Patient is agreeable. No agency preference. [facility] is reviewing. Patient has a rollator at home. He declined RW and 3-in-1. Patient confirmed [facility] is [facility] and he sees Dr. Richrd Sox there.  Expected Discharge Plan: Skilled Nursing Facility Barriers to Discharge: Continued Medical Work up  Expected Discharge Plan and Services Expected Discharge Plan: Skilled Nursing Facility       Living arrangements for the past 2 months: Assisted Living Facility                                       Social Determinants of Health (SDOH) Interventions    Readmission Risk Interventions Readmission Risk Prevention Plan 05/16/2021  Post Dischage Appt Complete  Medication Screening Complete  Transportation Screening Complete  Some recent data might be hidden

## 2021-05-19 NOTE — TOC Transition Note (Addendum)
Transition of Care [facility]) - CM/SW Discharge Note   Patient Details  Name: Hector Miller MRN: [id number] Date of Birth: [birth year]  Transition of Care [facility]) CM/SW Contact:  Candie Chroman, LCSW Phone Number: 05/19/2021, 11:55 AM   Clinical Narrative:  Patient has orders to discharge home today. [facility] able to accept him for PT and OT. They will start services once they get authorization from the [place]. Patient is aware and agreeable. He says he will have a ride home today. No further concerns. CSW signing off.   3:04 pm: Patient does need a ride home. Address on facesheet is correct. Set up door-to-door transport. RN will have patient sign rider waiver. Asked her to fax to transportation dept. CSW signing off.  Final next level of care: [place] Barriers to Discharge: Barriers Resolved   Patient Goals and CMS Choice Patient states their goals for this hospitalization and ongoing recovery are:: SNF CMS Medicare.gov Compare Post Acute Care list provided to:: Patient Choice offered to / list presented to : Patient  Discharge Placement                    Patient and family notified of of transfer: 05/19/21  Discharge Plan and Services                          HH Arranged: PT, OT [facility] Agency: [place]) Date [place]: 05/19/21   Representative spoke with at [place]: Floydene Flock  Social Determinants of Health ([place]) Interventions     Readmission Risk Interventions Readmission Risk Prevention Plan 05/16/2021  Post Dischage Appt Complete  Medication Screening Complete  Transportation Screening Complete  Some recent data might be hidden

## 2021-05-19 NOTE — Discharge Summary (Addendum)
Discharge Summary  Hector Miller [id number] DOB: December 16, 1945  PCP: Lorre Munroe, NP  Admit date: 05/14/2021 Discharge date: 05/19/2021  Time spent: 35 minutes   Recommendations for Outpatient Follow-up:  Follow-up with your primary care provider Take your medications as prescribed Continue PT OT with assistance and fall precautions Completely abstain from alcohol use  Discharge Diagnoses:  Active Hospital Problems   Diagnosis Date Noted   COVID-19 virus infection 05/14/2021   Coronary artery disease 05/14/2021   Type 2 diabetes mellitus with hyperglycemia (HCC) 05/14/2021   Neuropathy 05/14/2021   Dizziness 05/14/2021   Weakness 05/14/2021   Mood disorder (HCC) 05/14/2021   Spinal stenosis, lumbar region, with neurogenic claudication 02/25/2021   HTN (hypertension) 12/02/2014    Resolved Hospital Problems  No resolved problems to display.    Discharge Condition: Stable  Diet recommendation: Resume previous diet.  Vitals:   05/19/21 0751 05/19/21 1107  BP: 126/74 124/68  Pulse: 82 80  Resp: 16 16  Temp: (!) 97.4 F (36.3 C) (!) 97.4 F (36.3 C)  SpO2: 98% 95%    History of present illness:   HPI was taken from Dr. Maryfrances Bunnell: Hector Miller is a 76 y.o. 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   Patient was in usual state of health until day of admission when he was in a public area of his ILF, couldn't stand, and so someone activated EMS.  He had no fever, cough.  He has chronic nausea, but had also vomited a few times this week.   In the ER here, vitals unremarkable.  CXR clear.  SpO2 93-95% on room air.  COVID+.  Electrolytes and renal function normal.   Couldn't stand, and so EDP asked for observation overnight for PT eval and possible  rehab.     Hospital course as per Dr. Mayford Knife 05/15/21-05/18/21: Pt was found to have COVID19 and is on paxlovid and bronchodilators. Of note, pt also present w/ generalized weakness. PT/OT evaluated pt and recommended SNF initially and now recs HH. HH orders placed and CM will set up. 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   05/19/2021: Patient was seen and examined at his bedside.  There were no acute events overnight.  He wants to go home.  He has no new complaints.  Oxygen saturation 95 to 98% on room air.      Hospital Course:  Principal Problem:   COVID-19 virus infection Active Problems:   HTN (hypertension)   Spinal stenosis, lumbar region, with neurogenic claudication   Coronary artery disease   Type 2 diabetes mellitus with hyperglycemia (HCC)   Neuropathy   Dizziness   Weakness   Mood disorder (HCC)  COVID-19 viral infection:  Completed course of extubated.   Continue bronchodilators as needed.   Mobilize as tolerated.   O2 saturation 95 to 98% on room air.   Alcohol abuse: Significant abuse as per pt's nephew. Pt has no interest in stopping drinking alcohol currently  No evidence of acute alcohol withdrawal at time of this visit.   Resolved thrombocytopenia:  Platelet count 184.   Resolved hyponatremia:  Serum sodium 138.   Improved generalized weakness: likely secondary to COVID19.  PT/OT recommended home health PT   DM2 with hyperglycemia:  Resume  home metformin. Follow-up with your PCP   CAD: s/p CABG in 2014 & aortic valve repair.  Continue to hold losartan Resume home Lasix   HTN:  BP is at goal Follow-up to PCP.   Polyneuropathy:  Continue gabapentin   Mood disorder: Continue on home dose of remeron  Follow-up with your PCP.   Chronic dizziness:  PCP notes indicate he has had work-up by neurology and cardiology, MRI brain negative, but neurology feels that this is likely cerebellar in origin, from chronic alcohol use.       Code Status: DNR   Discharge Exam: BP 124/68 (BP Location: Left Arm)    Pulse 80    Temp (!) 97.4 F (36.3 C) (Oral)    Resp 16    Ht 6' (1.829 m)    Wt 99.8 kg    SpO2 95%    BMI 29.84 kg/m  General: 76 y.o. year-old male well developed well nourished in no acute distress.  Alert and oriented x3. Cardiovascular: Regular rate and rhythm with no rubs or gallops.  No thyromegaly or JVD noted.   Respiratory: Clear to auscultation with no wheezes or rales. Good inspiratory effort. Abdomen: Soft nontender nondistended with normal bowel sounds x4 quadrants. Musculoskeletal: No lower extremity edema. 2/4 pulses in all 4 extremities. Skin: No ulcerative lesions noted or rashes, Psychiatry: Mood is appropriate for condition and setting  Discharge Instructions You were cared for by a hospitalist during your hospital stay. If you have any questions about your discharge medications or the care you received while you were in the hospital after you are discharged, you can call the unit and asked to speak with the hospitalist on call if the hospitalist that took care of you is not available. Once you are discharged, your primary care physician will handle any further medical issues. Please note that NO REFILLS for any discharge medications will be authorized once you are discharged, as it is imperative that you return to your primary care physician (or establish a relationship with a primary care physician if you do not have one) for your aftercare needs so that they can reassess your need for medications and monitor your lab values.   Allergies as of 05/19/2021   No Known Allergies      Medication List     STOP taking these medications    losartan 25 MG tablet Commonly known as: COZAAR   pregabalin 25 MG capsule Commonly known as: Lyrica       TAKE these medications    albuterol 108 (90 Base) MCG/ACT inhaler Commonly known as: VENTOLIN HFA Inhale 2 puffs into the lungs every 6 (six)  hours as needed for wheezing or shortness of breath.   allopurinol 300 MG tablet Commonly known as: ZYLOPRIM Take 300 mg by mouth daily.   aspirin 325 MG tablet Take 325 mg by mouth daily.   benzonatate 100 MG capsule Commonly known as: Tessalon Perles Take 2 capsules (200 mg total) by mouth 3 (three) times daily as needed for cough.   cholecalciferol 25 MCG (1000 UNIT) tablet Commonly known as: VITAMIN D Take 1,000 Units by mouth daily.   finasteride 5 MG tablet Commonly known as: PROSCAR Take 5 mg by mouth daily.   Fish Oil 1000 MG Caps Take 1,000 mg by mouth 2 (two) times daily.   folic acid 1 MG tablet Commonly known as: FOLVITE Take 1 mg by mouth daily.   furosemide 20 MG tablet Commonly known as: LASIX Take 20  mg by mouth.   gabapentin 300 MG capsule Commonly known as: NEURONTIN Take 300 mg by mouth 3 (three) times daily.   metFORMIN 500 MG tablet Commonly known as: GLUCOPHAGE Take 1,000 mg by mouth 2 (two) times daily with a meal.   mirtazapine 30 MG tablet Commonly known as: REMERON Take 30 mg by mouth at bedtime.   pantoprazole 40 MG tablet Commonly known as: PROTONIX Take 40 mg by mouth daily.   polyethylene glycol 17 g packet Commonly known as: MIRALAX / GLYCOLAX Take 17 g by mouth daily. Start taking on: May 20, 2021   rosuvastatin 20 MG tablet Commonly known as: CRESTOR Take 20 mg by mouth at bedtime.   thiamine 100 MG tablet Commonly known as: Vitamin B-1 Take 100 mg by mouth daily.   traZODone 100 MG tablet Commonly known as: DESYREL Take 100 mg by mouth at bedtime as needed for sleep.       No Known Allergies  Follow-up Information     [facility].   Specialty: Home Health Services Why: They will follow up with you for your home health needs once they get [place] the [place]. Physical therapy and occupational therapy.        Lorre MunroeBaity, Regina W, NP. Call today.   Specialties: Internal  Medicine, Emergency Medicine Why: Please call for a posthospital follow-up appointment. Contact information: [address] [place] [address] [phone]                  The results of significant diagnostics from this hospitalization (including imaging, microbiology, ancillary and laboratory) are listed below for reference.    Significant Diagnostic Studies: DG Chest Port 1 View  Result Date: 05/13/2021 CLINICAL DATA:  Dizziness for 2 years. Chest pain for 2 days. Nausea and vomiting. EXAM: PORTABLE CHEST 1 VIEW COMPARISON:  03/26/2013 FINDINGS: Postoperative changes in the mediastinum. Cardiac valve prosthesis. Heart size and pulmonary vascularity are normal. Lungs are clear. No pleural effusions. No pneumothorax. Linear atelectasis in the left base. Mediastinal contours appear intact. Calcification of the aorta. IMPRESSION: No active disease. Electronically Signed   By: Burman NievesWilliam  Stevens M.D.   On: 05/13/2021 21:43    Microbiology: Recent Results (from the past 240 hour(s))  Resp Panel by RT-PCR (Flu A&B, Covid) Nasopharyngeal Swab     Status: Abnormal   Collection Time: 05/13/21  9:11 PM   Specimen: Nasopharyngeal Swab; Nasopharyngeal(NP) swabs in vial transport medium  Result Value Ref Range Status   SARS Coronavirus 2 by RT PCR POSITIVE (A) NEGATIVE Final    Comment: (NOTE) SARS-CoV-2 target nucleic acids are DETECTED.  The SARS-CoV-2 RNA is generally detectable in upper respiratory specimens during the acute phase of infection. Positive results are indicative of the presence of the identified virus, but do not rule out bacterial infection or co-infection with other pathogens not detected by the test. Clinical correlation with patient history and other diagnostic information is necessary to determine patient infection status. The expected result is Negative.  Fact Sheet for Patients: BloggerCourse.comhttps://www.fda.gov/media/152166/download  Fact Sheet for Healthcare  Providers: SeriousBroker.ithttps://www.fda.gov/media/152162/download  This test is not yet approved or cleared by the [place] FDA and  has been authorized for detection and/or diagnosis of SARS-CoV-2 by FDA under an Emergency Use Authorization (EUA).  This EUA will remain in effect (meaning this test can be used) for the duration of  the COVID-19 declaration under Section 564(b)(1) of the A ct, 21 U.S.C. section 360bbb-3(b)(1), unless  the authorization is terminated or revoked sooner.     Influenza A by PCR NEGATIVE NEGATIVE Final   Influenza B by PCR NEGATIVE NEGATIVE Final    Comment: (NOTE) The Xpert Xpress SARS-CoV-2/FLU/RSV plus assay is intended as an aid in the diagnosis of influenza from Nasopharyngeal swab specimens and should not be used as a sole basis for treatment. Nasal washings and aspirates are unacceptable for Xpert Xpress SARS-CoV-2/FLU/RSV testing.  Fact Sheet for Patients: BloggerCourse.comhttps://www.fda.gov/media/152166/download  Fact Sheet for Healthcare Providers: SeriousBroker.ithttps://www.fda.gov/media/152162/download  This test is not yet approved or cleared by the [place] FDA and has been authorized for detection and/or diagnosis of SARS-CoV-2 by FDA under an Emergency Use Authorization (EUA). This EUA will remain in effect (meaning this test can be used) for the duration of the COVID-19 declaration under Section 564(b)(1) of the Act, 21 U.S.C. section 360bbb-3(b)(1), unless the authorization is terminated or revoked.  Performed at [facility], [address] [place], [place] [address]      Labs: Basic Metabolic Panel: Recent Labs  Lab 05/13/21 2111 05/16/21 0653 05/17/21 0430 05/18/21 0517 05/19/21 0526  NA 130* 132* 135 138 138  K 4.0 3.7 3.6 4.2 4.1  CL 97* 99 102 103 104  CO2 23 23 25 28 29   GLUCOSE 250* 211* 209* 203* 207*  BUN 14 12 13 12 11   CREATININE 0.94 0.69 0.74 0.83 0.71  CALCIUM 9.1 8.9 9.0 8.8* 8.6*   Liver Function Tests: Recent Labs   Lab 05/13/21 2111  AST 36  ALT 22  ALKPHOS 78  BILITOT 1.2  PROT 7.6  ALBUMIN 3.9   Recent Labs  Lab 05/13/21 2111  LIPASE 47   No results for input(s): AMMONIA in the last 168 hours. CBC: Recent Labs  Lab 05/13/21 2111 05/16/21 0653 05/17/21 0430 05/18/21 0517 05/19/21 0526  WBC 8.9 5.9 5.7 5.0 4.9  HGB 16.5 14.6 14.2 14.2 13.8  HCT 47.5 42.1 41.0 42.2 41.3  MCV 98.3 98.4 97.2 98.1 99.5  PLT 128* 125* 140* 161 184   Cardiac Enzymes: No results for input(s): CKTOTAL, CKMB, CKMBINDEX, TROPONINI in the last 168 hours. BNP: BNP (last 3 results) No results for input(s): BNP in the last 8760 hours.  ProBNP (last 3 results) No results for input(s): PROBNP in the last 8760 hours.  CBG: Recent Labs  Lab 05/18/21 0753 05/18/21 1145 05/18/21 1625 05/18/21 2055 05/19/21 0728  GLUCAP 221* 305* 355* 195* 221*       Signed:  Darlin Droparole N Shalaine Payson, MD [facility] 05/19/2021, 11:59 AM

## 2021-05-20 ENCOUNTER — Telehealth: Payer: Self-pay

## 2021-05-20 NOTE — Telephone Encounter (Signed)
[facility] would like to establish care with local PCP. Pt advised to contact office for scheduling. Patient receiving follow up with Dr. Richrd Sox at [facility] but has not started receiving home health at this time. Patient encouraged to designate and follow with individual PCP for continuity of care.

## 2021-05-21 NOTE — Telephone Encounter (Signed)
noted 

## 2021-06-08 ENCOUNTER — Telehealth: Payer: Self-pay

## 2021-06-08 NOTE — Telephone Encounter (Signed)
Transition Care Management Follow-up Telephone Call Date of discharge and from where: 05/19/2021  [facility] How have you been since you were released from the hospital? "Still dont feel good"  Saw MD at the [place]. Reports he refused PT and OT Any questions or concerns? No  Items Reviewed: Did the pt receive and understand the discharge instructions provided? Yes  Medications obtained and verified? Yes  Other? No  Any new allergies since your discharge? No  Dietary orders reviewed? No Do you have support at home?  unknown  Home Care and Equipment/Supplies: Were home health services ordered? yes If so, what is the name of the agency? Patient refused  Has the agency set up a time to come to the patient's home? not applicable Were any new equipment or medical supplies ordered?  No What is the name of the medical supply agency?  Were you able to get the supplies/equipment? not applicable Do you have any questions related to the use of the equipment or supplies?   Functional Questionnaire: (I = Independent and D = Dependent) ADLs: I  Bathing/Dressing- I  Meal Prep- I  Eating- I  Maintaining continence- I  Transferring/Ambulation- I  Managing Meds- I  Follow up appointments reviewed:  PCP Hospital f/u appt confirmed? Yes   Saw MD at the [facility] f/u appt confirmed? No   Are transportation arrangements needed? No  If their condition worsens, is the pt aware to call PCP or go to the Emergency Dept.? Yes Was the patient provided with contact information for the PCP's office or ED? Yes Was to pt encouraged to call back with questions or concerns? Yes  Tomasa Rand, RN, BSN, CEN [facility] [employer] [phone]

## 2022-01-19 ENCOUNTER — Emergency Department: Payer: No Typology Code available for payment source

## 2022-01-19 ENCOUNTER — Encounter: Payer: Self-pay | Admitting: Emergency Medicine

## 2022-01-19 ENCOUNTER — Other Ambulatory Visit: Payer: Self-pay

## 2022-01-19 ENCOUNTER — Emergency Department
Admission: EM | Admit: 2022-01-19 | Discharge: 2022-01-19 | Disposition: A | Discharge status: Home / Self Care | Payer: No Typology Code available for payment source | Attending: Emergency Medicine | Admitting: Emergency Medicine

## 2022-01-19 DIAGNOSIS — I2510 Atherosclerotic heart disease of native coronary artery without angina pectoris: Secondary | ICD-10-CM | POA: Insufficient documentation

## 2022-01-19 DIAGNOSIS — E119 Type 2 diabetes mellitus without complications: Secondary | ICD-10-CM | POA: Insufficient documentation

## 2022-01-19 DIAGNOSIS — I10 Essential (primary) hypertension: Secondary | ICD-10-CM | POA: Diagnosis not present

## 2022-01-19 DIAGNOSIS — S6992XA Unspecified injury of left wrist, hand and finger(s), initial encounter: Secondary | ICD-10-CM | POA: Diagnosis present

## 2022-01-19 DIAGNOSIS — S62645A Nondisplaced fracture of proximal phalanx of left ring finger, initial encounter for closed fracture: Secondary | ICD-10-CM | POA: Diagnosis not present

## 2022-01-19 DIAGNOSIS — Z23 Encounter for immunization: Secondary | ICD-10-CM | POA: Diagnosis not present

## 2022-01-19 DIAGNOSIS — S50812A Abrasion of left forearm, initial encounter: Secondary | ICD-10-CM

## 2022-01-19 DIAGNOSIS — S81012A Laceration without foreign body, left knee, initial encounter: Secondary | ICD-10-CM | POA: Insufficient documentation

## 2022-01-19 DIAGNOSIS — W108XXA Fall (on) (from) other stairs and steps, initial encounter: Secondary | ICD-10-CM | POA: Diagnosis not present

## 2022-01-19 DIAGNOSIS — S80212A Abrasion, left knee, initial encounter: Secondary | ICD-10-CM

## 2022-01-19 DIAGNOSIS — S51812A Laceration without foreign body of left forearm, initial encounter: Secondary | ICD-10-CM | POA: Insufficient documentation

## 2022-01-19 MED ORDER — TETANUS-DIPHTH-ACELL PERTUSSIS 5-2.5-18.5 LF-MCG/0.5 IM SUSY
0.5000 mL | PREFILLED_SYRINGE | Freq: Once | INTRAMUSCULAR | Status: AC
  Administered 2022-01-19: 0.5 mL via INTRAMUSCULAR
  Filled 2022-01-19: qty 0.5

## 2022-01-19 MED ORDER — BACITRACIN ZINC 500 UNIT/GM EX OINT
TOPICAL_OINTMENT | CUTANEOUS | Status: AC
  Filled 2022-01-19: qty 0.9

## 2022-01-19 NOTE — Discharge Instructions (Signed)
Please monitor the abrasions closely for any sign or concern of infection.  Keep the wounds clean and dry.  Apply antibiotic ointment daily.  You may leave the wounds open to air if no risk of further injury.  Return to the emergency department for any sign or concern if you are unable to see primary care.

## 2022-01-19 NOTE — ED Provider Notes (Signed)
[facility] Provider Note    Event Date/Time   First MD Initiated Contact with Patient 01/19/22 1538     (approximate)   History   Fall   HPI  Pranish Akhavan is a 76 y.o. male with history of alcohol use, frequent falls, DM II, HTN, CAD and as listed in EMR presents to the emergency department for evaluation after mechanical, non-syncopal fall while walking down stairs Missed a step and fell down another. Pain now in left knee, left arm, skin tear, and scrapes. Denies loss of consciousness or hitting his head. Not on blood thinners.    Physical Exam   Triage Vital Signs: ED Triage Vitals  Enc Vitals Group     BP 01/19/22 1439 (!) 133/101     Pulse Rate 01/19/22 1439 (!) 105     Resp 01/19/22 1439 18     Temp 01/19/22 1439 98 F (36.7 C)     Temp Source 01/19/22 1439 Oral     SpO2 01/19/22 1439 91 %     Weight 01/19/22 1436 210 lb (95.3 kg)     Height 01/19/22 1436 6' (1.829 m)     Head Circumference --      Peak Flow --      Pain Score 01/19/22 1436 8     Pain Loc --      Pain Edu? --      Excl. in GC? --     Most recent vital signs: Vitals:   01/19/22 1439 01/19/22 1720  BP: (!) 133/101 (!) 149/74  Pulse: (!) 105 95  Resp: 18 18  Temp: 98 F (36.7 C) 98 F (36.7 C)  SpO2: 91% 96%    General: Awake, no distress. CV:  Good peripheral perfusion. Resp:  Normal effort.  Abd:  No distention.  Other:  Skin: Skin tear to proximal, dorsal aspect of left forearm and lateral aspect of left knee.  Focal tenderness over proximal phalanx of left index finger without open wound.  Musculoskeletal: FROM of left elbow and knee. No tenderness over shoulder, wrist, hip or ankle.   ED Results / Procedures / Treatments   Labs (all labs ordered are listed, but only abnormal results are displayed) Labs Reviewed - No data to display   EKG  Not indicated.   RADIOLOGY  Image and radiology report reviewed by me.  CT head cervical spine  negative for acute concerns.  X-ray image of the left hand negative for acute bony abnormality per radiology.  Upon my review, question nondisplaced fracture of the proximal phalanx of the left ring finger.  X-ray image of the left knee negative for acute findings.  Tricompartmental arthritis noted.  PROCEDURES:  Critical Care performed: No  Procedures   MEDICATIONS ORDERED IN ED: Medications  bacitracin ointment ( Topical Given 01/19/22 1714)  Tdap (BOOSTRIX) injection 0.5 mL (0.5 mLs Intramuscular Given 01/19/22 1715)     IMPRESSION / MDM / ASSESSMENT AND PLAN / ED COURSE   I have reviewed the triage note.  Differential diagnosis includes, but is not limited to, ICH, cervical vertebral injury, cervical strain, skin tear lacerations, abrasions, knee effusion, patella fracture, tibial plateau fracture, tib-fib fracture  76 year old male presenting to the emergency department for treatment and evaluation after mechanical, nonsyncopal fall witnessed by significant other.  Imaging is overall reassuring with the exception of a questionable nondisplaced fracture of the left proximal phalanx of the ring finger.  This is a focal area of pain in  the hand.  Fingers were buddy taped.  Wounds of the left forearm and left knee cleaned and dressed with antibiotic ointment, nonstick dressings.  Wound care discussed.  Tdap will be updated today as well.  Patient is to monitor closely for any sign or concern of infection of the wounds.  If he does have concerns he is to see primary care or return to the emergency department.      FINAL CLINICAL IMPRESSION(S) / ED DIAGNOSES   Final diagnoses:  Abrasion of left forearm, initial encounter  Abrasion of left knee, initial encounter  Closed nondisplaced fracture of proximal phalanx of left ring finger, initial encounter     Rx / DC Orders   ED Discharge Orders     None        Note:  This document was prepared using Dragon voice recognition  software and may include unintentional dictation errors.   Chinita Pester, FNP 01/19/22 1722    Chesley Noon, MD 01/20/22 [phone]

## 2022-01-19 NOTE — ED Triage Notes (Signed)
Patient to ED for fall. Patient states he was walking down stairs and fell. Pain noted to left knee and arm. Skin tare and and scrapes noted. All bleeding controlled. Denies hitting head, LOC or blood thinners.

## 2022-01-19 NOTE — ED Notes (Signed)
Dc instructions reviewed with pt no questions or concerns at this time. Will follow up as needed.  

## 2022-01-19 NOTE — ED Provider Triage Note (Signed)
Emergency Medicine Provider Triage Evaluation Note  Hector Miller , a 76 y.o. male  was evaluated in triage.  Pt complains of fourth finger pain, left knee pain, did hit his head.  Review of Systems  Positive:  Negative:   Physical Exam  Ht 6' (1.829 m)   Wt 95.3 kg   BMI 28.48 kg/m  Gen:   Awake, no distress   Resp:  Normal effort  MSK:   Moves extremities without difficulty, left fourth finger is dislocated Other:    Medical Decision Making  Medically screening exam initiated at 2:37 PM.  Appropriate orders placed.  Gleen Ripberger was informed that the remainder of the evaluation will be completed by another provider, this initial triage assessment does not replace that evaluation, and the importance of remaining in the ED until their evaluation is complete.  I reduce the finger, foot buddy tape on them will get x-ray   Faythe Ghee, PA-C 01/19/22 1438

## 2022-02-02 IMAGING — US US CAROTID DUPLEX BILAT
1 series · 14 of 24 positions shown · non-contrast
Comparison: None.

CLINICAL DATA: Amaurosis fugax

EXAM:
BILATERAL CAROTID DUPLEX ULTRASOUND
TECHNIQUE: Gray scale imaging, color Doppler and duplex ultrasound were
performed of bilateral carotid and vertebral arteries in the neck.

[Series 1: us carotid bilateral · 14 of 64 slices shown]
[im 1/64]
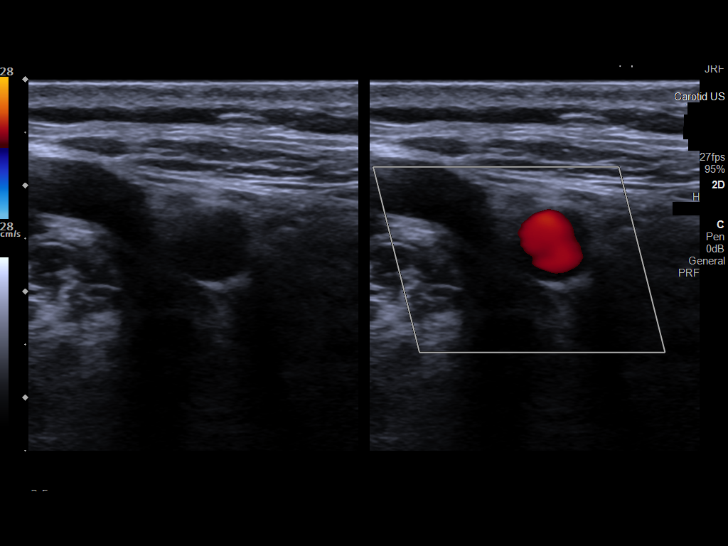
[im 6/64]
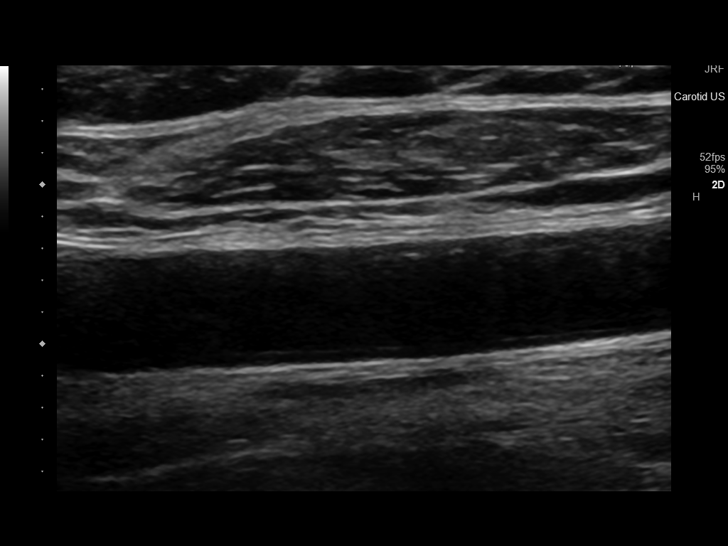
[im 11/64]
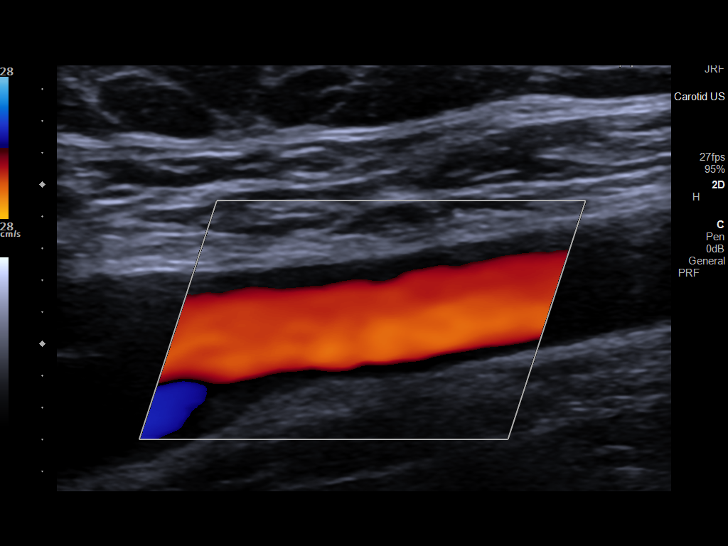
[im 17/64]
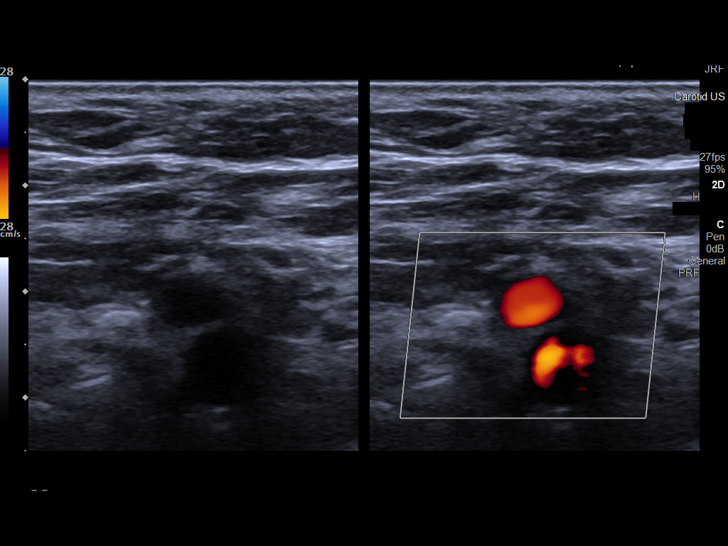
[im 20/64]
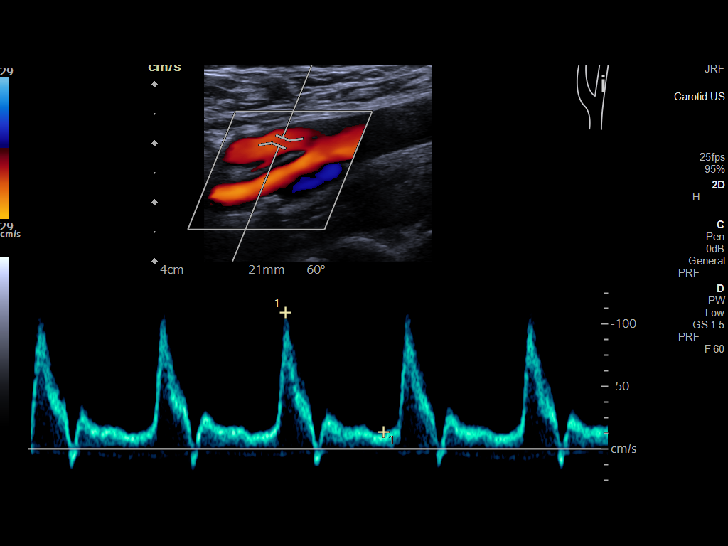
[im 25/64]
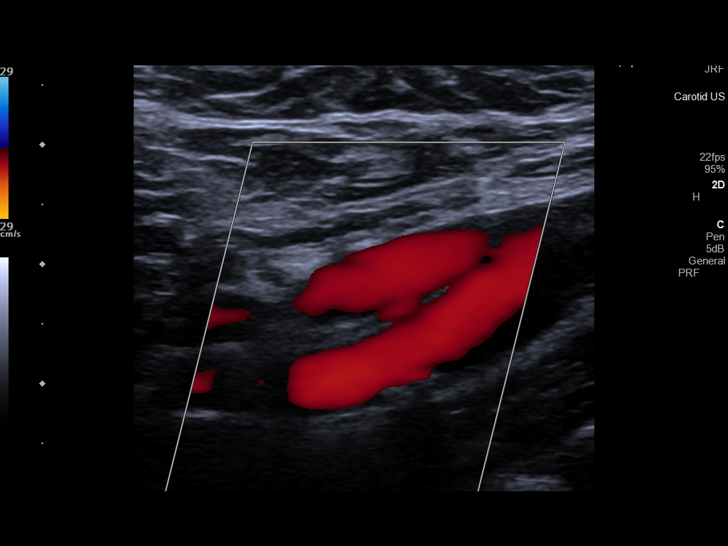
[im 31/64]
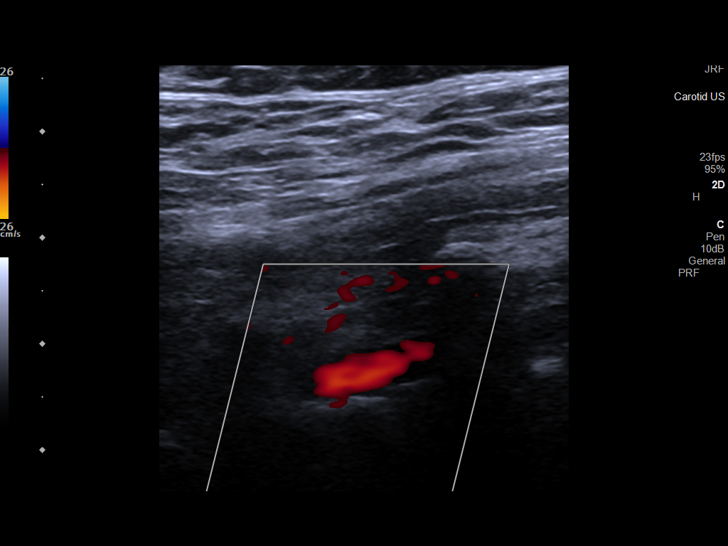
[im 33/64]
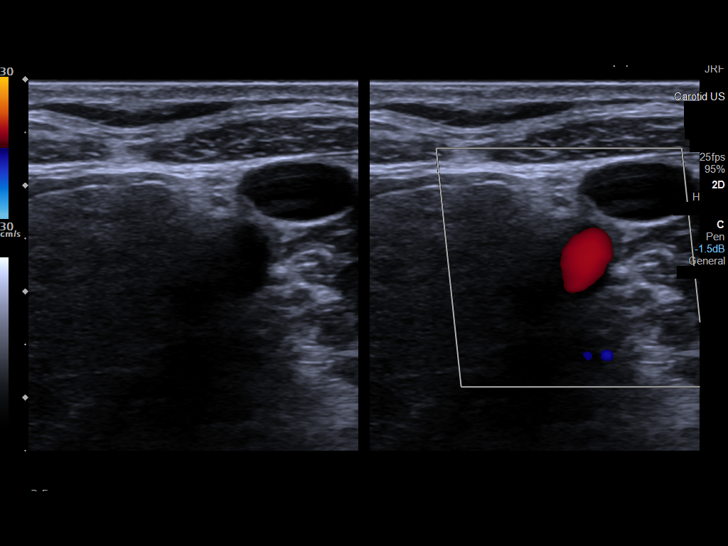
[im 39/64]
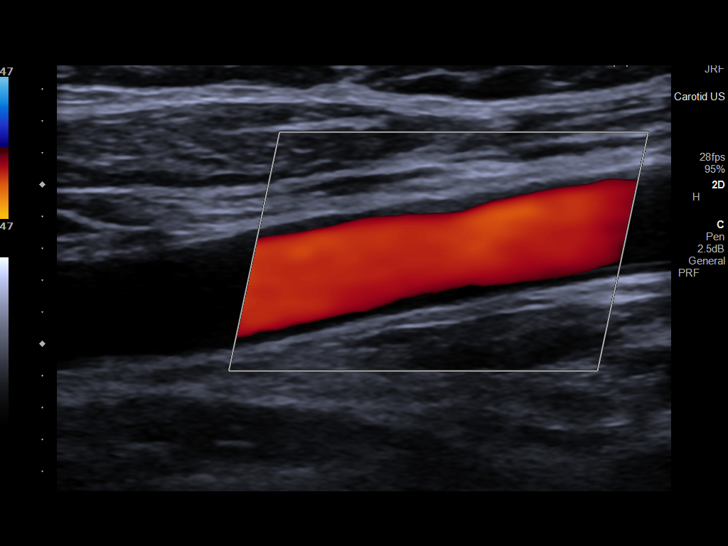
[im 44/64]
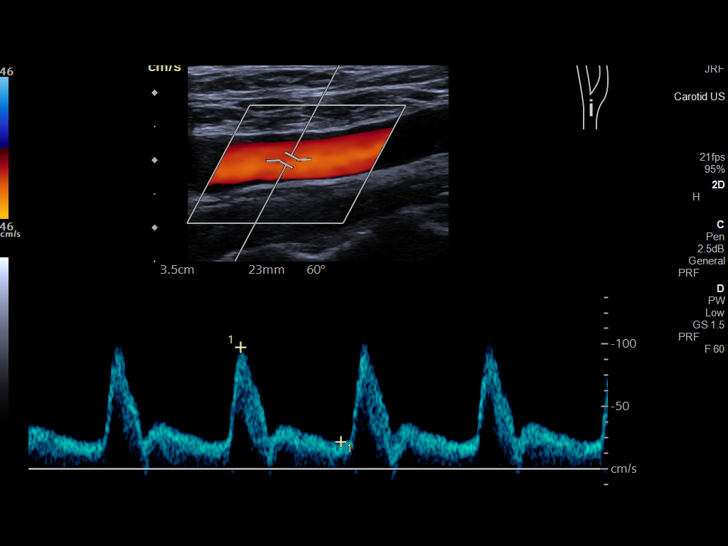
[im 50/64]
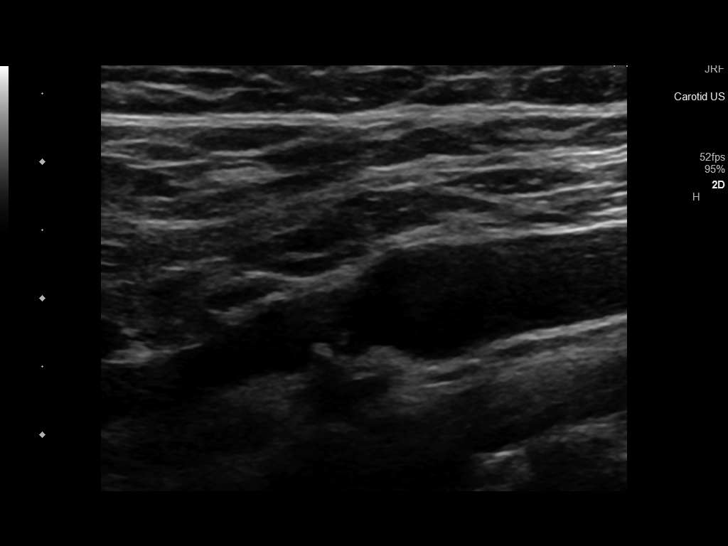
[im 53/64]
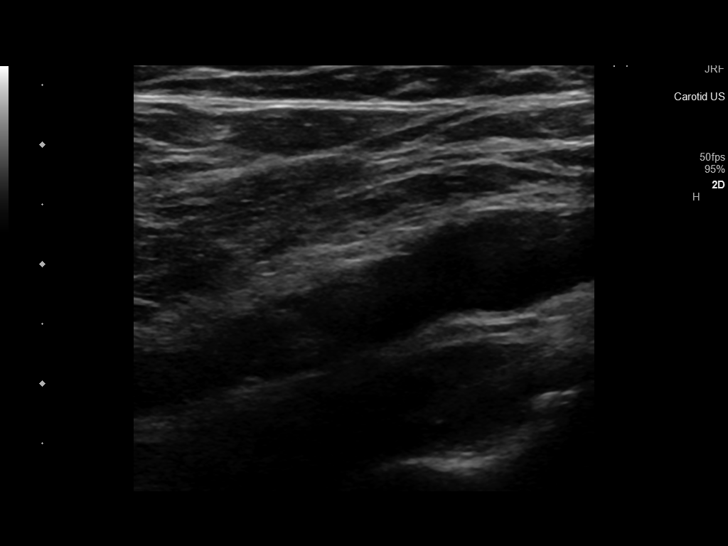
[im 58/64]
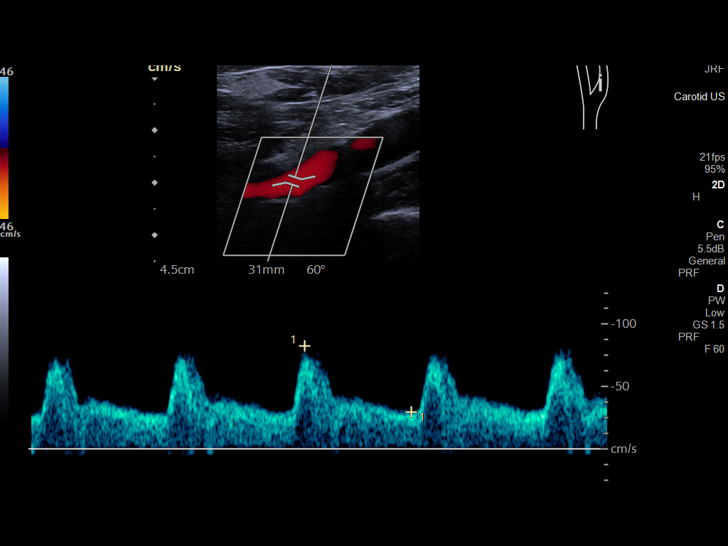
[im 64/64]
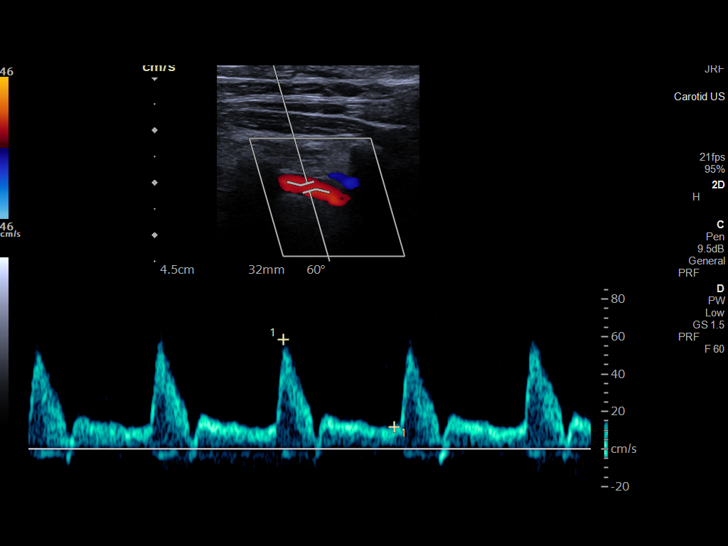

[14 of 24 positions shown; findings below may reference images not displayed]

FINDINGS: Criteria: Quantification of carotid stenosis is based on velocity
parameters that correlate the residual internal carotid diameter
with NASCET-based stenosis levels, using the diameter of the distal
internal carotid lumen as the denominator for stenosis measurement.

The following velocity measurements were obtained:

RIGHT

ICA: 97/17 cm/sec

CCA: 95/20 cm/sec

SYSTOLIC ICA/CCA RATIO:

ECA: 109 cm/sec

LEFT

ICA: 91/32 cm/sec

CCA: 105/22 cm/sec

SYSTOLIC ICA/CCA RATIO:

ECA: 93 cm/sec

RIGHT CAROTID ARTERY: Mild heterogeneous plaque of the internal
carotid artery origin.

RIGHT VERTEBRAL ARTERY:  Antegrade flow.

LEFT CAROTID ARTERY: Minimal atheromatous plaque of the carotid
bifurcation.

LEFT VERTEBRAL ARTERY:  Antegrade flow.
IMPRESSION: No significant stenosis of internal carotid arteries.

## 2022-03-25 ENCOUNTER — Inpatient Hospital Stay: Payer: No Typology Code available for payment source

## 2022-03-25 ENCOUNTER — Emergency Department: Payer: No Typology Code available for payment source

## 2022-03-25 ENCOUNTER — Inpatient Hospital Stay
Admission: EM | Admit: 2022-03-25 | Discharge: 2022-04-01 | DRG: 552 | Disposition: A | Discharge status: Skilled Nursing Facility | Payer: No Typology Code available for payment source | Source: Skilled Nursing Facility | Attending: Osteopathic Medicine | Admitting: Osteopathic Medicine

## 2022-03-25 ENCOUNTER — Other Ambulatory Visit: Payer: Self-pay

## 2022-03-25 DIAGNOSIS — F32A Depression, unspecified: Secondary | ICD-10-CM | POA: Diagnosis present

## 2022-03-25 DIAGNOSIS — R7989 Other specified abnormal findings of blood chemistry: Secondary | ICD-10-CM | POA: Diagnosis not present

## 2022-03-25 DIAGNOSIS — E872 Acidosis, unspecified: Secondary | ICD-10-CM | POA: Diagnosis present

## 2022-03-25 DIAGNOSIS — A419 Sepsis, unspecified organism: Principal | ICD-10-CM | POA: Diagnosis present

## 2022-03-25 DIAGNOSIS — F1010 Alcohol abuse, uncomplicated: Secondary | ICD-10-CM | POA: Diagnosis not present

## 2022-03-25 DIAGNOSIS — E1151 Type 2 diabetes mellitus with diabetic peripheral angiopathy without gangrene: Secondary | ICD-10-CM | POA: Diagnosis present

## 2022-03-25 DIAGNOSIS — Z951 Presence of aortocoronary bypass graft: Secondary | ICD-10-CM

## 2022-03-25 DIAGNOSIS — T50995A Adverse effect of other drugs, medicaments and biological substances, initial encounter: Secondary | ICD-10-CM | POA: Diagnosis not present

## 2022-03-25 DIAGNOSIS — F10229 Alcohol dependence with intoxication, unspecified: Secondary | ICD-10-CM | POA: Diagnosis present

## 2022-03-25 DIAGNOSIS — S22089A Unspecified fracture of T11-T12 vertebra, initial encounter for closed fracture: Secondary | ICD-10-CM | POA: Diagnosis present

## 2022-03-25 DIAGNOSIS — M48061 Spinal stenosis, lumbar region without neurogenic claudication: Secondary | ICD-10-CM | POA: Diagnosis present

## 2022-03-25 DIAGNOSIS — R6520 Severe sepsis without septic shock: Secondary | ICD-10-CM | POA: Diagnosis not present

## 2022-03-25 DIAGNOSIS — E785 Hyperlipidemia, unspecified: Secondary | ICD-10-CM | POA: Diagnosis present

## 2022-03-25 DIAGNOSIS — E1142 Type 2 diabetes mellitus with diabetic polyneuropathy: Secondary | ICD-10-CM | POA: Diagnosis present

## 2022-03-25 DIAGNOSIS — T796XXA Traumatic ischemia of muscle, initial encounter: Secondary | ICD-10-CM | POA: Diagnosis present

## 2022-03-25 DIAGNOSIS — G720 Drug-induced myopathy: Secondary | ICD-10-CM | POA: Diagnosis not present

## 2022-03-25 DIAGNOSIS — R339 Retention of urine, unspecified: Secondary | ICD-10-CM | POA: Diagnosis not present

## 2022-03-25 DIAGNOSIS — K219 Gastro-esophageal reflux disease without esophagitis: Secondary | ICD-10-CM | POA: Diagnosis present

## 2022-03-25 DIAGNOSIS — Z66 Do not resuscitate: Secondary | ICD-10-CM | POA: Diagnosis present

## 2022-03-25 DIAGNOSIS — Z79899 Other long term (current) drug therapy: Secondary | ICD-10-CM

## 2022-03-25 DIAGNOSIS — I2510 Atherosclerotic heart disease of native coronary artery without angina pectoris: Secondary | ICD-10-CM | POA: Diagnosis present

## 2022-03-25 DIAGNOSIS — Z952 Presence of prosthetic heart valve: Secondary | ICD-10-CM | POA: Diagnosis not present

## 2022-03-25 DIAGNOSIS — S32009A Unspecified fracture of unspecified lumbar vertebra, initial encounter for closed fracture: Secondary | ICD-10-CM

## 2022-03-25 DIAGNOSIS — M47816 Spondylosis without myelopathy or radiculopathy, lumbar region: Secondary | ICD-10-CM | POA: Diagnosis present

## 2022-03-25 DIAGNOSIS — Z823 Family history of stroke: Secondary | ICD-10-CM

## 2022-03-25 DIAGNOSIS — E1165 Type 2 diabetes mellitus with hyperglycemia: Secondary | ICD-10-CM | POA: Diagnosis present

## 2022-03-25 DIAGNOSIS — I10 Essential (primary) hypertension: Secondary | ICD-10-CM | POA: Diagnosis present

## 2022-03-25 DIAGNOSIS — Z820 Family history of epilepsy and other diseases of the nervous system: Secondary | ICD-10-CM

## 2022-03-25 DIAGNOSIS — F10930 Alcohol use, unspecified with withdrawal, uncomplicated: Secondary | ICD-10-CM

## 2022-03-25 DIAGNOSIS — R531 Weakness: Secondary | ICD-10-CM | POA: Diagnosis not present

## 2022-03-25 DIAGNOSIS — N179 Acute kidney failure, unspecified: Secondary | ICD-10-CM | POA: Diagnosis present

## 2022-03-25 DIAGNOSIS — E8729 Other acidosis: Secondary | ICD-10-CM | POA: Diagnosis present

## 2022-03-25 DIAGNOSIS — R262 Difficulty in walking, not elsewhere classified: Secondary | ICD-10-CM | POA: Diagnosis present

## 2022-03-25 DIAGNOSIS — S32019A Unspecified fracture of first lumbar vertebra, initial encounter for closed fracture: Secondary | ICD-10-CM | POA: Diagnosis present

## 2022-03-25 DIAGNOSIS — S32000A Wedge compression fracture of unspecified lumbar vertebra, initial encounter for closed fracture: Secondary | ICD-10-CM | POA: Diagnosis not present

## 2022-03-25 DIAGNOSIS — M5440 Lumbago with sciatica, unspecified side: Secondary | ICD-10-CM | POA: Diagnosis not present

## 2022-03-25 DIAGNOSIS — R6511 Systemic inflammatory response syndrome (SIRS) of non-infectious origin with acute organ dysfunction: Secondary | ICD-10-CM | POA: Diagnosis present

## 2022-03-25 DIAGNOSIS — Z87891 Personal history of nicotine dependence: Secondary | ICD-10-CM

## 2022-03-25 DIAGNOSIS — Z7984 Long term (current) use of oral hypoglycemic drugs: Secondary | ICD-10-CM

## 2022-03-25 DIAGNOSIS — S32008S Other fracture of unspecified lumbar vertebra, sequela: Secondary | ICD-10-CM | POA: Diagnosis not present

## 2022-03-25 DIAGNOSIS — E860 Dehydration: Secondary | ICD-10-CM | POA: Diagnosis present

## 2022-03-25 DIAGNOSIS — Z1152 Encounter for screening for COVID-19: Secondary | ICD-10-CM | POA: Diagnosis not present

## 2022-03-25 DIAGNOSIS — Y92003 Bedroom of unspecified non-institutional (private) residence as the place of occurrence of the external cause: Secondary | ICD-10-CM

## 2022-03-25 DIAGNOSIS — M5410 Radiculopathy, site unspecified: Secondary | ICD-10-CM | POA: Diagnosis not present

## 2022-03-25 DIAGNOSIS — Z8249 Family history of ischemic heart disease and other diseases of the circulatory system: Secondary | ICD-10-CM

## 2022-03-25 DIAGNOSIS — R338 Other retention of urine: Secondary | ICD-10-CM

## 2022-03-25 DIAGNOSIS — W06XXXA Fall from bed, initial encounter: Secondary | ICD-10-CM | POA: Diagnosis present

## 2022-03-25 DIAGNOSIS — Z888 Allergy status to other drugs, medicaments and biological substances status: Secondary | ICD-10-CM

## 2022-03-25 DIAGNOSIS — N401 Enlarged prostate with lower urinary tract symptoms: Secondary | ICD-10-CM | POA: Diagnosis present

## 2022-03-25 DIAGNOSIS — B349 Viral infection, unspecified: Secondary | ICD-10-CM | POA: Diagnosis present

## 2022-03-25 LAB — HEMOGLOBIN A1C
Hgb A1c MFr Bld: 6.8 % — ABNORMAL HIGH (ref 4.8–5.6)
Mean Plasma Glucose: 148.46 mg/dL

## 2022-03-25 LAB — CBG MONITORING, ED
Glucose-Capillary: 191 mg/dL — ABNORMAL HIGH (ref 70–99)
Glucose-Capillary: 213 mg/dL — ABNORMAL HIGH (ref 70–99)
Glucose-Capillary: 130 mg/dL — ABNORMAL HIGH (ref 70–99)

## 2022-03-25 LAB — COMPREHENSIVE METABOLIC PANEL
ALT: 39 U/L (ref 0–44)
ALT: 41 U/L (ref 0–44)
AST: 85 U/L — ABNORMAL HIGH (ref 15–41)
AST: 148 U/L — ABNORMAL HIGH (ref 15–41)
Albumin: 4.5 g/dL (ref 3.5–5.0)
Albumin: 3.3 g/dL — ABNORMAL LOW (ref 3.5–5.0)
Alkaline Phosphatase: 83 U/L (ref 38–126)
Alkaline Phosphatase: 57 U/L (ref 38–126)
Anion gap: 25 — ABNORMAL HIGH (ref 5–15)
Anion gap: 12 (ref 5–15)
BUN: 14 mg/dL (ref 8–23)
BUN: 15 mg/dL (ref 8–23)
CO2: 11 mmol/L — ABNORMAL LOW (ref 22–32)
CO2: 21 mmol/L — ABNORMAL LOW (ref 22–32)
Calcium: 10.3 mg/dL (ref 8.9–10.3)
Calcium: 8.6 mg/dL — ABNORMAL LOW (ref 8.9–10.3)
Chloride: 101 mmol/L (ref 98–111)
Chloride: 103 mmol/L (ref 98–111)
Creatinine, Ser: 1.52 mg/dL — ABNORMAL HIGH (ref 0.61–1.24)
Creatinine, Ser: 1.11 mg/dL (ref 0.61–1.24)
GFR, Estimated: 47 mL/min — ABNORMAL LOW (ref >60)
GFR, Estimated: >60 mL/min (ref >60)
Glucose, Bld: 325 mg/dL — ABNORMAL HIGH (ref 70–99)
Glucose, Bld: 182 mg/dL — ABNORMAL HIGH (ref 70–99)
Potassium: 4.1 mmol/L (ref 3.5–5.1)
Potassium: 4 mmol/L (ref 3.5–5.1)
Sodium: 137 mmol/L (ref 135–145)
Sodium: 136 mmol/L (ref 135–145)
Total Bilirubin: 2.2 mg/dL — ABNORMAL HIGH (ref 0.3–1.2)
Total Bilirubin: 1.7 mg/dL — ABNORMAL HIGH (ref 0.3–1.2)
Total Protein: 8.3 g/dL — ABNORMAL HIGH (ref 6.5–8.1)
Total Protein: 6.5 g/dL (ref 6.5–8.1)

## 2022-03-25 LAB — CBC
HCT: 46.8 % (ref 39.0–52.0)
Hemoglobin: 16.3 g/dL (ref 13.0–17.0)
MCH: 34.5 pg — ABNORMAL HIGH (ref 26.0–34.0)
MCHC: 34.8 g/dL (ref 30.0–36.0)
MCV: 98.9 fL (ref 80.0–100.0)
Platelets: 140 10*3/uL — ABNORMAL LOW (ref 150–400)
RBC: 4.73 MIL/uL (ref 4.22–5.81)
RDW: 12.8 % (ref 11.5–15.5)
WBC: 13.6 10*3/uL — ABNORMAL HIGH (ref 4.0–10.5)
nRBC: 0 % (ref 0.0–0.2)

## 2022-03-25 LAB — CBC WITH DIFFERENTIAL/PLATELET
Abs Immature Granulocytes: 0.06 10*3/uL (ref 0.00–0.07)
Basophils Absolute: 0 10*3/uL (ref 0.0–0.1)
Basophils Relative: 0 %
Eosinophils Absolute: 0 10*3/uL (ref 0.0–0.5)
Eosinophils Relative: 0 %
HCT: 40.9 % (ref 39.0–52.0)
Hemoglobin: 14 g/dL (ref 13.0–17.0)
Immature Granulocytes: 1 %
Lymphocytes Relative: 4 %
Lymphs Abs: 0.4 10*3/uL — ABNORMAL LOW (ref 0.7–4.0)
MCH: 34.3 pg — ABNORMAL HIGH (ref 26.0–34.0)
MCHC: 34.2 g/dL (ref 30.0–36.0)
MCV: 100.2 fL — ABNORMAL HIGH (ref 80.0–100.0)
Monocytes Absolute: 0.9 10*3/uL (ref 0.1–1.0)
Monocytes Relative: 9 %
Neutro Abs: 8.2 10*3/uL — ABNORMAL HIGH (ref 1.7–7.7)
Neutrophils Relative %: 86 %
Platelets: 83 10*3/uL — ABNORMAL LOW (ref 150–400)
RBC: 4.08 MIL/uL — ABNORMAL LOW (ref 4.22–5.81)
RDW: 13 % (ref 11.5–15.5)
WBC: 9.5 10*3/uL (ref 4.0–10.5)
nRBC: 0 % (ref 0.0–0.2)

## 2022-03-25 LAB — LACTIC ACID, PLASMA
Lactic Acid, Venous: 8.9 mmol/L (ref 0.5–1.9)
Lactic Acid, Venous: 2.2 mmol/L (ref 0.5–1.9)
Lactic Acid, Venous: 5.2 mmol/L (ref 0.5–1.9)

## 2022-03-25 LAB — MAGNESIUM
Magnesium: 2.8 mg/dL — ABNORMAL HIGH (ref 1.7–2.4)
Magnesium: 2.2 mg/dL (ref 1.7–2.4)

## 2022-03-25 LAB — URINALYSIS, COMPLETE (UACMP) WITH MICROSCOPIC
Bacteria, UA: NONE SEEN
Bilirubin Urine: NEGATIVE
Glucose, UA: >=500 mg/dL — AB
Ketones, ur: 20 mg/dL — AB
Leukocytes,Ua: NEGATIVE
Nitrite: NEGATIVE
Protein, ur: 100 mg/dL — AB
Specific Gravity, Urine: 1.017 (ref 1.005–1.030)
WBC, UA: NONE SEEN WBC/hpf (ref 0–5)
pH: 5 (ref 5.0–8.0)

## 2022-03-25 LAB — BLOOD GAS, VENOUS
Acid-base deficit: 13.1 mmol/L — ABNORMAL HIGH (ref 0.0–2.0)
Bicarbonate: 10.8 mmol/L — ABNORMAL LOW (ref 20.0–28.0)
O2 Saturation: 69.1 %
Patient temperature: 37
pCO2, Ven: 21 mmHg — ABNORMAL LOW (ref 44–60)
pH, Ven: 7.32 (ref 7.25–7.43)
pO2, Ven: 45 mmHg (ref 32–45)

## 2022-03-25 LAB — TROPONIN I (HIGH SENSITIVITY)
Troponin I (High Sensitivity): 101 ng/L (ref <18)
Troponin I (High Sensitivity): 149 ng/L (ref <18)
Troponin I (High Sensitivity): 145 ng/L (ref <18)

## 2022-03-25 LAB — RESP PANEL BY RT-PCR (FLU A&B, COVID) ARPGX2
Influenza A by PCR: NEGATIVE
Influenza B by PCR: NEGATIVE
SARS Coronavirus 2 by RT PCR: NEGATIVE

## 2022-03-25 LAB — CK: Total CK: 782 U/L — ABNORMAL HIGH (ref 49–397)

## 2022-03-25 MED ORDER — ACETAMINOPHEN 325 MG PO TABS
650.0000 mg | ORAL_TABLET | Freq: Four times a day (QID) | ORAL | Status: DC | PRN
  Administered 2022-03-26 – 2022-03-27 (×2): 650 mg via ORAL
  Filled 2022-03-25 (×2): qty 2

## 2022-03-25 MED ORDER — LACTATED RINGERS IV BOLUS
1000.0000 mL | Freq: Once | INTRAVENOUS | Status: AC
  Administered 2022-03-25: 1000 mL via INTRAVENOUS

## 2022-03-25 MED ORDER — LORAZEPAM 2 MG PO TABS: 0.0000 mg | ORAL_TABLET | Freq: Three times a day (TID) | ORAL | Status: AC

## 2022-03-25 MED ORDER — FOLIC ACID 1 MG PO TABS
1.0000 mg | ORAL_TABLET | Freq: Every day | ORAL | Status: DC
  Administered 2022-03-25 – 2022-04-01 (×8): 1 mg via ORAL
  Filled 2022-03-25 (×8): qty 1

## 2022-03-25 MED ORDER — PANTOPRAZOLE SODIUM 40 MG PO TBEC
40.0000 mg | DELAYED_RELEASE_TABLET | Freq: Every day | ORAL | Status: DC
  Administered 2022-03-25 – 2022-04-01 (×8): 40 mg via ORAL
  Filled 2022-03-25 (×8): qty 1

## 2022-03-25 MED ORDER — VANCOMYCIN HCL 1500 MG/300ML IV SOLN
1500.0000 mg | INTRAVENOUS | Status: DC
  Administered 2022-03-25: 1500 mg via INTRAVENOUS
  Filled 2022-03-25 (×2): qty 300

## 2022-03-25 MED ORDER — INSULIN ASPART 100 UNIT/ML IJ SOLN
10.0000 [IU] | Freq: Once | INTRAMUSCULAR | Status: AC
  Administered 2022-03-25: 10 [IU] via INTRAVENOUS
  Filled 2022-03-25: qty 1

## 2022-03-25 MED ORDER — LACTATED RINGERS IV BOLUS
2000.0000 mL | Freq: Once | INTRAVENOUS | Status: AC
  Administered 2022-03-25: 2000 mL via INTRAVENOUS

## 2022-03-25 MED ORDER — ENOXAPARIN SODIUM 60 MG/0.6ML IJ SOSY
0.5000 mg/kg | PREFILLED_SYRINGE | INTRAMUSCULAR | Status: DC
  Administered 2022-03-25 – 2022-03-27 (×3): 50 mg via SUBCUTANEOUS
  Filled 2022-03-25 (×3): qty 0.6

## 2022-03-25 MED ORDER — ACETAMINOPHEN 650 MG RE SUPP: 650.0000 mg | Freq: Four times a day (QID) | RECTAL | Status: DC | PRN

## 2022-03-25 MED ORDER — TRAZODONE HCL 100 MG PO TABS
100.0000 mg | ORAL_TABLET | Freq: Every evening | ORAL | Status: DC | PRN
  Administered 2022-03-27 – 2022-03-28 (×2): 100 mg via ORAL
  Filled 2022-03-25 (×3): qty 1

## 2022-03-25 MED ORDER — POLYETHYLENE GLYCOL 3350 17 G PO PACK
17.0000 g | PACK | Freq: Every day | ORAL | Status: DC
  Administered 2022-03-25 – 2022-04-01 (×8): 17 g via ORAL
  Filled 2022-03-25 (×8): qty 1

## 2022-03-25 MED ORDER — LORAZEPAM 2 MG PO TABS
2.0000 mg | ORAL_TABLET | Freq: Once | ORAL | Status: AC
  Administered 2022-03-25: 2 mg via ORAL
  Filled 2022-03-25: qty 1

## 2022-03-25 MED ORDER — LORAZEPAM 1 MG PO TABS
1.0000 mg | ORAL_TABLET | ORAL | Status: DC | PRN
  Administered 2022-03-25: 2 mg via ORAL

## 2022-03-25 MED ORDER — SODIUM CHLORIDE 0.9 % IV BOLUS
1000.0000 mL | Freq: Once | INTRAVENOUS | Status: AC
  Administered 2022-03-25: 1000 mL via INTRAVENOUS

## 2022-03-25 MED ORDER — SODIUM CHLORIDE 0.9 % IV SOLN: 2.0000 g | Freq: Once | INTRAVENOUS | Status: DC

## 2022-03-25 MED ORDER — LORAZEPAM 2 MG/ML IJ SOLN: 1.0000 mg | INTRAMUSCULAR | Status: DC | PRN

## 2022-03-25 MED ORDER — GABAPENTIN 300 MG PO CAPS
300.0000 mg | ORAL_CAPSULE | Freq: Three times a day (TID) | ORAL | Status: DC
  Administered 2022-03-25 – 2022-04-01 (×20): 300 mg via ORAL
  Filled 2022-03-25 (×20): qty 1

## 2022-03-25 MED ORDER — METOPROLOL TARTRATE 25 MG PO TABS
12.5000 mg | ORAL_TABLET | Freq: Two times a day (BID) | ORAL | Status: DC
  Administered 2022-03-25 – 2022-03-31 (×11): 12.5 mg via ORAL
  Filled 2022-03-25 (×14): qty 1

## 2022-03-25 MED ORDER — VANCOMYCIN HCL IN DEXTROSE 1-5 GM/200ML-% IV SOLN
1000.0000 mg | Freq: Once | INTRAVENOUS | Status: AC
  Administered 2022-03-25: 1000 mg via INTRAVENOUS
  Filled 2022-03-25: qty 200

## 2022-03-25 MED ORDER — ROSUVASTATIN CALCIUM 10 MG PO TABS
20.0000 mg | ORAL_TABLET | Freq: Every day | ORAL | Status: DC
  Administered 2022-03-26: 20 mg via ORAL
  Filled 2022-03-25: qty 1
  Filled 2022-03-25: qty 2

## 2022-03-25 MED ORDER — SODIUM CHLORIDE 0.9 % IV SOLN
2.0000 g | Freq: Once | INTRAVENOUS | Status: AC
  Administered 2022-03-25: 2 g via INTRAVENOUS
  Filled 2022-03-25: qty 12.5

## 2022-03-25 MED ORDER — ONDANSETRON HCL 4 MG PO TABS: 4.0000 mg | ORAL_TABLET | Freq: Four times a day (QID) | ORAL | Status: DC | PRN

## 2022-03-25 MED ORDER — FINASTERIDE 5 MG PO TABS
5.0000 mg | ORAL_TABLET | Freq: Every day | ORAL | Status: DC
  Administered 2022-03-25 – 2022-04-01 (×8): 5 mg via ORAL
  Filled 2022-03-25 (×8): qty 1

## 2022-03-25 MED ORDER — MIRTAZAPINE 15 MG PO TABS
30.0000 mg | ORAL_TABLET | Freq: Every day | ORAL | Status: DC
  Administered 2022-03-26 – 2022-03-31 (×6): 30 mg via ORAL
  Filled 2022-03-25 (×6): qty 2

## 2022-03-25 MED ORDER — LORAZEPAM 2 MG PO TABS
0.0000 mg | ORAL_TABLET | ORAL | Status: AC
  Filled 2022-03-25: qty 1

## 2022-03-25 MED ORDER — THIAMINE MONONITRATE 100 MG PO TABS
100.0000 mg | ORAL_TABLET | Freq: Every day | ORAL | Status: DC
  Administered 2022-03-26 – 2022-04-01 (×7): 100 mg via ORAL
  Filled 2022-03-25 (×7): qty 1

## 2022-03-25 MED ORDER — ASPIRIN 81 MG PO CHEW
81.0000 mg | CHEWABLE_TABLET | Freq: Every day | ORAL | Status: DC
  Administered 2022-03-25 – 2022-04-01 (×8): 81 mg via ORAL
  Filled 2022-03-25 (×8): qty 1

## 2022-03-25 MED ORDER — METRONIDAZOLE 500 MG/100ML IV SOLN
500.0000 mg | Freq: Once | INTRAVENOUS | Status: AC
  Administered 2022-03-25: 500 mg via INTRAVENOUS
  Filled 2022-03-25: qty 100

## 2022-03-25 MED ORDER — SODIUM CHLORIDE 0.9 % IV SOLN
2.0000 g | Freq: Two times a day (BID) | INTRAVENOUS | Status: DC
  Administered 2022-03-25 – 2022-03-26 (×2): 2 g via INTRAVENOUS
  Filled 2022-03-25 (×2): qty 12.5

## 2022-03-25 MED ORDER — ADULT MULTIVITAMIN W/MINERALS CH
1.0000 | ORAL_TABLET | Freq: Every day | ORAL | Status: DC
  Administered 2022-03-25 – 2022-03-26 (×2): 1 via ORAL
  Filled 2022-03-25 (×2): qty 1

## 2022-03-25 MED ORDER — VITAMIN D 25 MCG (1000 UNIT) PO TABS
1000.0000 [IU] | ORAL_TABLET | Freq: Every day | ORAL | Status: DC
  Administered 2022-03-25 – 2022-04-01 (×8): 1000 [IU] via ORAL
  Filled 2022-03-25 (×8): qty 1

## 2022-03-25 MED ORDER — THIAMINE HCL 100 MG/ML IJ SOLN
100.0000 mg | Freq: Once | INTRAMUSCULAR | Status: AC
  Administered 2022-03-25: 100 mg via INTRAVENOUS
  Filled 2022-03-25: qty 2

## 2022-03-25 MED ORDER — ONDANSETRON HCL 4 MG/2ML IJ SOLN: 4.0000 mg | Freq: Four times a day (QID) | INTRAMUSCULAR | Status: DC | PRN

## 2022-03-25 MED ORDER — LACTATED RINGERS IV SOLN: INTRAVENOUS | Status: AC

## 2022-03-25 MED ORDER — VANCOMYCIN HCL IN DEXTROSE 1-5 GM/200ML-% IV SOLN: 1000.0000 mg | Freq: Once | INTRAVENOUS | Status: DC

## 2022-03-25 MED ORDER — ALBUTEROL SULFATE (2.5 MG/3ML) 0.083% IN NEBU: 2.5000 mg | INHALATION_SOLUTION | Freq: Four times a day (QID) | RESPIRATORY_TRACT | Status: DC | PRN

## 2022-03-25 MED ORDER — INSULIN ASPART 100 UNIT/ML IJ SOLN
0.0000 [IU] | Freq: Three times a day (TID) | INTRAMUSCULAR | Status: DC
  Administered 2022-03-25: 3 [IU] via SUBCUTANEOUS
  Administered 2022-03-25: 5 [IU] via SUBCUTANEOUS
  Administered 2022-03-26: 2 [IU] via SUBCUTANEOUS
  Administered 2022-03-26 – 2022-03-27 (×3): 3 [IU] via SUBCUTANEOUS
  Administered 2022-03-27: 5 [IU] via SUBCUTANEOUS
  Administered 2022-03-27 – 2022-03-29 (×5): 3 [IU] via SUBCUTANEOUS
  Administered 2022-03-29: 5 [IU] via SUBCUTANEOUS
  Administered 2022-03-29 – 2022-03-30 (×3): 3 [IU] via SUBCUTANEOUS
  Administered 2022-03-30: 5 [IU] via SUBCUTANEOUS
  Administered 2022-03-31 – 2022-04-01 (×4): 3 [IU] via SUBCUTANEOUS
  Administered 2022-04-01: 2 [IU] via SUBCUTANEOUS
  Filled 2022-03-25 (×22): qty 1

## 2022-03-25 MED ORDER — METRONIDAZOLE 500 MG/100ML IV SOLN
500.0000 mg | Freq: Two times a day (BID) | INTRAVENOUS | Status: DC
  Administered 2022-03-25 – 2022-03-27 (×4): 500 mg via INTRAVENOUS
  Filled 2022-03-25 (×4): qty 100

## 2022-03-25 NOTE — Assessment & Plan Note (Signed)
Patient with a history of coronary artery disease status post CABG and noted to have elevated troponin levels Elevated troponin levels may be secondary to demand ischemia from tachycardia We will trend troponin levels Continue Crestor and place patient on low-dose aspirin We will add low-dose beta-blockers

## 2022-03-25 NOTE — Assessment & Plan Note (Signed)
Continue Remeron and Lexapro

## 2022-03-25 NOTE — ED Provider Notes (Signed)
[facility] Provider Note    Event Date/Time   First MD Initiated Contact with Patient 03/25/22 [phone]     (approximate)  History   Chief Complaint: Loss of Consciousness  HPI  Maleik Vanderzee is a 76 y.o. male with a past medical history of CAD, diabetes, hypertension, hyperlipidemia presents to the emergency department after a fall and syncopal episode.  According to EMS they were called out to the patient's residence from fall.  Patient states he believes he fell out of his bed he was able to get to a phone to call EMS.  EMS states upon their arrival they attempted to stand the patient up and he had a syncopal event.  They noted the patient's blood sugar to be elevated greater than 300 on fingerstick.  Patient is tachycardic to 120 is not tachypneic to the mid 20s upon arrival.  Patient denies any recent illnesses fever cough congestion nausea vomiting or diarrhea.  He is not sure if he hit his head.  Physical Exam   Triage Vital Signs: ED Triage Vitals  Enc Vitals Group     BP 03/25/22 0829 (!) 130/90     Pulse Rate 03/25/22 0829 (!) 124     Resp 03/25/22 0829 (!) 22     Temp 03/25/22 0829 97.6 F (36.4 C)     Temp Source 03/25/22 0829 Oral     SpO2 03/25/22 0829 94 %     Weight --      Height --      Head Circumference --      Peak Flow --      Pain Score 03/25/22 0831 9     Pain Loc --      Pain Edu? --      Excl. in GC? --     Most recent vital signs: Vitals:   03/25/22 0829  BP: (!) 130/90  Pulse: (!) 124  Resp: (!) 22  Temp: 97.6 F (36.4 C)  SpO2: 94%    General: Awake, no distress.  CV:  Good peripheral perfusion.  Regular rhythm rate around 120 bpm. Resp:  Mild tachypnea but without wheeze rales or rhonchi. Abd:  No distention.  Soft, nontender.  No rebound or guarding. Abdomen Other:  No obvious trauma to extremities.   ED Results / Procedures / Treatments   EKG  EKG viewed and interpreted by myself shows sinus  tachycardia at 128 bpm with a narrow QRS, normal axis, normal intervals, nonspecific but no concerning ST changes.  RADIOLOGY  I have reviewed and interpreted the CT head images.  No large abnormality seen on my evaluation. CT scan of the head and C-spine are negative for acute abnormality. Chest x-ray is clear.  MEDICATIONS ORDERED IN ED: Medications  sodium chloride 0.9 % bolus 1,000 mL (has no administration in time range)     IMPRESSION / MDM / ASSESSMENT AND PLAN / ED COURSE  I reviewed the triage vital signs and the nursing notes.  Patient's presentation is most consistent with acute presentation with potential threat to life or bodily function.  Patient presents emergency department for syncopal episode and fall.  Patient noted to be tachycardic and tachypneic.  Differential is quite broad but would include urinary tract infection, other infectious etiology such as COVID, pneumonia, intracranial injury or hemorrhage, metabolic or electrolyte abnormality, DKA, renal insufficiency.  We will begin IV hydration we will send broad labs, VBG we will obtain imaging of the head and chest.  We will send blood cultures and a lactic acid as a precaution.  Patient agreeable to plan.  Patient's labs have resulted showing mild leukocytosis.  Given the patient's tachycardia tachypnea with leukocytosis meeting sepsis criteria I have ordered broad-spectrum antibiotics.  Patient is lactic acid has resulted significantly elevated 8.9 I have ordered 30 mL/kg of IV fluids for the patient totaling 3 L.  Patient's chemistry shows significant anion gap but only mild renal insufficiency.  Patient's blood glucose is elevated greater than 300 we will dose IV insulin in addition to IV fluids.  Patient does admit to heavy daily alcohol use, highly suspect a degree of this is likely due to withdrawal symptoms we will dose Ativan.  Patient's troponin is elevated greater than 100 we will continue to closely monitor and  I have added on a CK level.  Reassuringly patient's COVID and flu are negative  CK7 182.  Not significantly elevated and the patient is receiving fluids.  VBG reassuringly shows a pH of 7.32.  We will admit to the hospital service for ongoing treatment.  CRITICAL CARE Performed by: Minna Antis   Total critical care time: 30 minutes  Critical care time was exclusive of separately billable procedures and treating other patients.  Critical care was necessary to treat or prevent imminent or life-threatening deterioration.  Critical care was time spent personally by me on the following activities: development of treatment plan with patient and/or surrogate as well as nursing, discussions with consultants, evaluation of patient's response to treatment, examination of patient, obtaining history from patient or surrogate, ordering and performing treatments and interventions, ordering and review of laboratory studies, ordering and review of radiographic studies, pulse oximetry and re-evaluation of patient's condition.    FINAL CLINICAL IMPRESSION(S) / ED DIAGNOSES   Sepsis Syncope Fall Alcohol withdrawal Hyperglycemia    Note:  This document was prepared using Dragon voice recognition software and may include unintentional dictation errors.   Minna Antis, MD 03/25/22 1116

## 2022-03-25 NOTE — Assessment & Plan Note (Signed)
Continue PPI ?

## 2022-03-25 NOTE — Assessment & Plan Note (Addendum)
We will start patient on low-dose beta-blocker

## 2022-03-25 NOTE — Assessment & Plan Note (Signed)
Treatment as outlined in 4 

## 2022-03-25 NOTE — ED Notes (Signed)
LA and trop result given to Dr. Vita Barley, new orders, see [facility]

## 2022-03-25 NOTE — ED Triage Notes (Signed)
Pt presents to ED via [facility] with c/o of having a syncopal episode at some point this morning. EMS states he was found in the bathroom. Pt states he fell out of bed around 0100-0200. EMS states he has pain to bilateral knees, pt has some abrasions to his area. EMS states pt was unresponsive for about 1 minute when they stood him up. Pt is A&Ox4. Pt denies fevers or chills. Pt denies any CP or SOB at this time.   CBG 300's, pt takes PO pills for this.

## 2022-03-25 NOTE — ED Notes (Signed)
This RN spoke to Uriah on the phone and gave pt update and plan of care.   Aurther Loft Dennard Nip) [phone] who is decision maker per pt.

## 2022-03-25 NOTE — Progress Notes (Signed)
Anticoagulation monitoring(Lovenox):  76yo  M ordered Lovenox 40 mg Q24h    Filed Weights   03/25/22 1157  Weight: 102.2 kg (225 lb 5 oz)   BMI 30.5   Lab Results  Component Value Date   CREATININE 1.52 (H) 03/25/2022   CREATININE 0.71 05/19/2021   CREATININE 0.83 05/18/2021   Estimated Creatinine Clearance: 50.3 mL/min (A) (by C-G formula based on SCr of 1.52 mg/dL (H)). Hemoglobin & Hematocrit     Component Value Date/Time   HGB 16.3 03/25/2022 0835   HCT 46.8 03/25/2022 0835     Per Protocol for Patient with estCrcl > 30 ml/min and BMI > 30, will transition to Lovenox 0.5mg /kg Q24h      Bari Mantis PharmD Clinical Pharmacist 03/25/2022

## 2022-03-25 NOTE — Assessment & Plan Note (Addendum)
Patient admits to daily alcohol use and during my evaluation is noted to be tremulous. He is also tachycardic which may be related to alcohol withdrawal Labs show AST levels 2 times ALT levels consistent with alcohol use We will place patient on lorazepam and administer for CIWA score of 8 or greater Place patient on MVI/thiamine/folic acid

## 2022-03-25 NOTE — H&P (Addendum)
History and Physical    Patient: Hector Miller WUJ:[id number] DOB: [birth year] DOA: 03/25/2022 DOS: the patient was seen and examined on 03/25/2022 [facility]: [facility]  [facility] coming from[facility]  Chief Complaint:  Chief Complaint  Patient presents with   Loss of Consciousness   HPI: Hector Miller is a 76 y.o. male with medical history significant for coronary artery disease, diabetes mellitus, hypertension, dyslipidemia, alcohol abuse who was brought into the ER by EMS for evaluation after he had a fall. Patient states that he fell out of bed around midnight and was unable to get up and so he crawled into the bathroom.  He remained in the bathroom throughout the night because he was unable to get up and was able to reach out to the staff at the retirement home when he resides in the early hours of the morning.  They called EMS who transported him to the ER. Patient states that he was in his usual state of health prior to going to bed and denies having any fever, no chills, no cough, no abdominal pain, no changes in his bowel habits, no hematemesis, no passage of melena stools or hematochezia, no headache, no dizziness or lightheadedness.  He denies having any urinary symptoms. He admits to daily alcohol use and denies having symptoms of alcohol withdrawal when he does not drink. Labs show troponin of 101, serum glucose of 325, bicarbonate level of 11 with an anion gap of 25, lactic acid 8.9, white count of 13.6 Chest x-ray shows no acute findings Patient received 3 L of IV fluids in the ER as well as a dose of cefepime, Flagyl and vancomycin. He also received 10 units of NovoLog He will be admitted to the hospital for further evaluation.    Review of Systems: As mentioned in the history of present illness. All other systems reviewed and are negative. 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 His smoking use included cigarettes, cigars, and pipe. He has quit using smokeless tobacco. He reports current alcohol use. He reports that he does not use drugs.  Allergies  Allergen Reactions   Lisinopril Cough    Family History  Problem Relation Age of Onset   Cancer Mother        Breast   Parkinson's disease Mother    Heart failure Father    Cancer Father        Stomach   Stroke Father    Hypertension Father     Prior to Admission medications   Medication Sig Start Date End Date Taking? Authorizing Provider  cholecalciferol (VITAMIN D) 25 MCG (1000 UNIT) tablet Take 1,000 Units by mouth daily.   Yes [provider]  finasteride (PROSCAR) 5 MG tablet Take 5 mg by mouth daily.   Yes [provider]  folic acid (FOLVITE) 1 MG tablet Take 1 mg by mouth daily.  Yes [provider]  furosemide (LASIX) 20 MG tablet Take 20 mg by mouth.   Yes [provider]  gabapentin (NEURONTIN) 300 MG capsule Take 300 mg by mouth 3 (three) times daily.   Yes [provider]  metFORMIN (GLUCOPHAGE) 500 MG tablet Take 1,000 mg by mouth 2 (two) times daily with a meal.   Yes [provider]  mirtazapine (REMERON) 30 MG tablet Take 30 mg by mouth at bedtime.   Yes [provider]  pantoprazole (PROTONIX) 40 MG tablet Take 40 mg by mouth daily.   Yes [provider]  polyethylene glycol (MIRALAX / GLYCOLAX) 17 g packet Take 17 g by mouth daily. 05/20/21  Yes Hall, Carole N, DO  rosuvastatin (CRESTOR) 20 MG tablet Take 20 mg by mouth  at bedtime.   Yes [provider]  thiamine (VITAMIN B-1) 100 MG tablet Take 100 mg by mouth daily.   Yes [provider]  albuterol (VENTOLIN HFA) 108 (90 Base) MCG/ACT inhaler Inhale 2 puffs into the lungs every 6 (six) hours as needed for wheezing or shortness of breath. 05/19/21   Darlin Drop, DO  allopurinol (ZYLOPRIM) 300 MG tablet Take 300 mg by mouth daily. Patient not taking: Reported on 03/25/2022    [provider]  aspirin 325 MG tablet Take 325 mg by mouth daily. Patient not taking: Reported on 03/25/2022    [provider]  benzonatate (TESSALON PERLES) 100 MG capsule Take 2 capsules (200 mg total) by mouth 3 (three) times daily as needed for cough. 05/19/21 05/19/22  Darlin Drop, DO  Omega-3 Fatty Acids (FISH OIL) 1000 MG CAPS Take 1,000 mg by mouth 2 (two) times daily. Patient not taking: Reported on 03/25/2022    [provider]  traZODone (DESYREL) 100 MG tablet Take 100 mg by mouth at bedtime as needed for sleep.    [provider]    Physical Exam: Vitals:   03/25/22 0829 03/25/22 0930 03/25/22 1115  BP: (!) 130/90 (!) 145/90 123/77  Pulse: (!) 124 (!) 114 (!) 106  Resp: (!) 22 (!) 22 (!) 27  Temp: 97.6 F (36.4 C)    TempSrc: Oral    SpO2: 94% 92% 92%   Physical Exam Vitals and nursing note reviewed.  Constitutional:      Appearance: Normal appearance. He is ill-appearing.  HENT:     Head: Normocephalic and atraumatic.     Nose: Nose normal.     Mouth/Throat:     Mouth: Mucous membranes are dry.  Eyes:     Conjunctiva/sclera: Conjunctivae normal.  Cardiovascular:     Rate and Rhythm: Tachycardia present.  Pulmonary:     Effort: Pulmonary effort is normal.     Breath sounds: Normal breath sounds.  Abdominal:     General: Abdomen is flat. Bowel sounds are normal.     Palpations: Abdomen is soft.  Musculoskeletal:        General: Normal range of motion.     Cervical back: Normal range of motion and  neck supple.     Comments: Tremulous  Skin:    General: Skin is warm and dry.     Comments: Bruising over his knees  Neurological:     Mental Status: He is alert and oriented to person, place, and time.     Motor: Weakness present.  Psychiatric:        Mood and Affect: Mood normal.        Behavior: Behavior  normal.     Data Reviewed: Relevant notes from primary care and specialist visits, past discharge summaries as available in EHR, including Care Everywhere. Prior diagnostic testing as pertinent to current admission diagnoses Updated medications and problem lists for reconciliation ED course, including vitals, labs, imaging, treatment and response to treatment Triage notes, nursing and pharmacy notes and ED provider's notes Notable results as noted in HPI Labs reviewed.  Total CK7 82, troponin 101, sodium 137, potassium 4.1, chloride 101, bicarb 11, glucose 325, BUN 14, creatinine 1.52, calcium 10.3, total protein 8.3, albumin 4.5, AST 85, ALT 39, alkaline phosphatase 83, total bilirubin 2.2, anion gap 25, lactic acid 8.9, white count 13.6, hemoglobin 16.3, hematocrit 46.8, platelet count 140, VBG 7.3 07/07/43/10.8/69 Chest x-ray reviewed by me shows no evidence of acute cardiopulmonary disease CT scan of head without contrast shows no acute intracranial abnormality. Marked brain parenchymal volume loss and moderate deep white matter microangiopathy. CT scan of cervical spine without contrast shows no acute fracture or subluxation of the cervical spine. 2. Mild multilevel osteoarthritic changes. Right facet arthropathy at C3-C4 with right neural foraminal stenosis. Twelve-lead EKG reviewed by me shows sinus tachycardia There are no new results to review at this time.  Assessment and Plan: * Severe sepsis (HCC) As evidenced by tachycardia, tachypnea, leukocytosis and markedly elevated lactic acid level with no obvious source at this time UA is pending Continue aggressive IV fluid  resuscitation initiated in the ER Place patient on cefepime, vancomycin and Flagyl Follow-up results of blood and urine culture Trend lactic acid levels   Type 2 diabetes mellitus with hyperglycemia (HCC) Patient has non-insulin-dependent diabetes mellitus and is on metformin. He had significant hyperglycemia upon presentation Hold metformin for now Glycemic control with sliding scale insulin Maintain consistent carbohydrate diet  AKI (acute kidney injury) (HCC) Patient noted to have an acute kidney injury He has a base line serum creatinine of 0.7 and today on admission it is 1.5 Acute kidney injury may be related to sepsis versus from osmotic diuresis related to hyperglycemia Continue aggressive IV fluid resuscitation Repeat renal prior Mutaz in a.m.  Elevated troponin Patient with a history of coronary artery disease status post CABG and noted to have elevated troponin levels Elevated troponin levels may be secondary to demand ischemia from tachycardia We will trend troponin levels Continue Crestor and place patient on low-dose aspirin We will add low-dose beta-blockers  Alcohol abuse Patient admits to daily alcohol use and during my evaluation is noted to be tremulous. He is also tachycardic which may be related to alcohol withdrawal Labs show AST levels 2 times ALT levels consistent with alcohol use We will place patient on lorazepam and administer for CIWA score of 8 or greater Place patient on MVI/thiamine/folic acid  Depression Continue Remeron and Lexapro  High anion gap metabolic acidosis Patient with significantly elevated lactic acid level which may be due to sepsis or related to metformin use. We will trend lactic acid levels  Traumatic rhabdomyolysis (HCC) Patient is status post fall with elevated CK levels Continue IV fluid resuscitation Repeat CK levels in a.m.  Coronary artery disease Treatment as outlined in 4  Gastroesophageal reflux disease without  esophagitis Continue PPI  HTN (hypertension) We will start patient on low-dose beta-blocker      Advance Care Planning:   Code Status: DNR   Consults: None  Family Communication: Greater than 50% of time was spent discussing patient's condition and plan of care with him at the bedside.  All  questions and concerns have been addressed.  He verbalizes understanding and agrees with the plan.  CODE STATUS was discussed and he wishes to be a DNR.  He lists his nephew as his healthcare power of attorney. Talked with his friend Tyson Alias who is listed as his alternate contact ([phone])  Severity of Illness: The appropriate patient status for this patient is INPATIENT. Inpatient status is judged to be reasonable and necessary in order to provide the required intensity of service to ensure the patient's safety. The patient's presenting symptoms, physical exam findings, and initial radiographic and laboratory data in the context of their chronic comorbidities is felt to place them at high risk for further clinical deterioration. Furthermore, it is not anticipated that the patient will be medically stable for discharge from the hospital within 2 midnights of admission.   * I certify that at the point of admission it is my clinical judgment that the patient will require inpatient hospital care spanning beyond 2 midnights from the point of admission due to high intensity of service, high risk for further deterioration and high frequency of surveillance required.*  Author: Lucile Shutters, MD 03/25/2022 11:49 AM  For on call review [email or web address].

## 2022-03-25 NOTE — Assessment & Plan Note (Signed)
Patient noted to have an acute kidney injury He has a base line serum creatinine of 0.7 and today on admission it is 1.5 Acute kidney injury may be related to sepsis versus from osmotic diuresis related to hyperglycemia Continue aggressive IV fluid resuscitation Repeat renal prior Mutaz in a.m.

## 2022-03-25 NOTE — Assessment & Plan Note (Signed)
Patient with significantly elevated lactic acid level which may be due to sepsis or related to metformin use. We will trend lactic acid levels

## 2022-03-25 NOTE — Sepsis Progress Note (Signed)
Code sepsis protocol being monitored by eLink. 

## 2022-03-25 NOTE — Assessment & Plan Note (Signed)
Patient has non-insulin-dependent diabetes mellitus and is on metformin. He had significant hyperglycemia upon presentation Hold metformin for now Glycemic control with sliding scale insulin Maintain consistent carbohydrate diet

## 2022-03-25 NOTE — Progress Notes (Signed)
PHARMACY -  BRIEF ANTIBIOTIC NOTE   Pharmacy has received consult(s) for vancomycin and cefepime from an ED provider.  The patient's profile has been reviewed for ht/wt/allergies/indication/available labs.    One time order(s) placed for vancomycin 1,000 mg x 1 and cefepime 2 grams x 1  Further antibiotics/pharmacy consults should be ordered by admitting physician if indicated.                       Thank you, Jaynie Bream 03/25/2022  9:43 AM

## 2022-03-25 NOTE — Progress Notes (Signed)
CODE SEPSIS - PHARMACY COMMUNICATION  **Broad Spectrum Antibiotics should be administered within 1 hour of Sepsis diagnosis**  Time Code Sepsis Called/Page Received: 0939  Antibiotics Ordered: vancomycin 1,000 mg x 1 and cefepime 2 grams x 1  Time of 1st antibiotic administration: 0951  Additional action taken by pharmacy: N/A     [name] ,PharmD Clinical Pharmacist  03/25/2022  10:00 AM

## 2022-03-25 NOTE — Assessment & Plan Note (Signed)
Patient with significantly elevated lactic acid level which may be due to sepsis or related to metformin use

## 2022-03-25 NOTE — Assessment & Plan Note (Signed)
As evidenced by tachycardia, tachypnea, leukocytosis and markedly elevated lactic acid level with no obvious source at this time UA is pending Continue aggressive IV fluid resuscitation initiated in the ER Place patient on cefepime, vancomycin and Flagyl Follow-up results of blood and urine culture Trend lactic acid levels

## 2022-03-25 NOTE — Assessment & Plan Note (Signed)
Patient is status post fall with elevated CK levels Continue IV fluid resuscitation Repeat CK levels in a.m.

## 2022-03-25 NOTE — Progress Notes (Signed)
Pharmacy Antibiotic Note  Hector Miller is a 76 y.o. male admitted on 03/25/2022 with sepsis.  Pharmacy has been consulted for vancomycin and cefepime dosing.  Assessment: 76 y.o. male with a past medical history of CAD, diabetes, hypertension, hyperlipidemia presents to the emergency department after a fall and syncopal episode. WBC 13.6, Tmax 98.4, Scr 1.52 (baseline 0.7-0.8s)   Vancomycin 1000 mg IV x1 and Cefepime 2 gm IV x1 in ED  Plan: Cefepime grams IV Q12H Vancomycin 1500 mg IV Q24H Vd 0.72, IBW/TBW, predicted AUC 491.5, Cmin 12.7, Cmax 32.3    Weight: 102.2 kg (225 lb 5 oz)  Temp (24hrs), Avg:98 F (36.7 C), Min:97.6 F (36.4 C), Max:98.4 F (36.9 C)  Recent Labs  Lab 03/25/22 0834 03/25/22 0835 03/25/22 1058 03/25/22 1107  WBC  --  13.6*  --   --   CREATININE  --  1.52*  --   --   LATICACIDVEN 8.9*  --  5.2* 2.2*    Estimated Creatinine Clearance: 50.3 mL/min (A) (by C-G formula based on SCr of 1.52 mg/dL (H)).    Allergies  Allergen Reactions   Lisinopril Cough    Antimicrobials this admission: Vancomycin 11/10 >>  Cefepime 11/10 >>  Metronidazole 11/10 >>  Dose adjustments this admission: N/A  Microbiology results: 11/10 BCx: pending   Thank you for allowing pharmacy to be a part of this patient's care.  Hector Miller 03/25/2022 2:16 PM

## 2022-03-26 DIAGNOSIS — A419 Sepsis, unspecified organism: Secondary | ICD-10-CM | POA: Diagnosis not present

## 2022-03-26 DIAGNOSIS — E860 Dehydration: Secondary | ICD-10-CM

## 2022-03-26 DIAGNOSIS — E8729 Other acidosis: Secondary | ICD-10-CM | POA: Diagnosis not present

## 2022-03-26 DIAGNOSIS — I10 Essential (primary) hypertension: Secondary | ICD-10-CM

## 2022-03-26 DIAGNOSIS — F10930 Alcohol use, unspecified with withdrawal, uncomplicated: Secondary | ICD-10-CM

## 2022-03-26 LAB — CBC WITH DIFFERENTIAL/PLATELET
Abs Immature Granulocytes: 0.04 10*3/uL (ref 0.00–0.07)
Basophils Absolute: 0.1 10*3/uL (ref 0.0–0.1)
Basophils Relative: 1 %
Eosinophils Absolute: 0.1 10*3/uL (ref 0.0–0.5)
Eosinophils Relative: 1 %
HCT: 41.5 % (ref 39.0–52.0)
Hemoglobin: 14.3 g/dL (ref 13.0–17.0)
Immature Granulocytes: 1 %
Lymphocytes Relative: 11 %
Lymphs Abs: 0.9 10*3/uL (ref 0.7–4.0)
MCH: 35.1 pg — ABNORMAL HIGH (ref 26.0–34.0)
MCHC: 34.5 g/dL (ref 30.0–36.0)
MCV: 102 fL — ABNORMAL HIGH (ref 80.0–100.0)
Monocytes Absolute: 0.7 10*3/uL (ref 0.1–1.0)
Monocytes Relative: 9 %
Neutro Abs: 6.8 10*3/uL (ref 1.7–7.7)
Neutrophils Relative %: 77 %
Platelets: 99 10*3/uL — ABNORMAL LOW (ref 150–400)
RBC: 4.07 MIL/uL — ABNORMAL LOW (ref 4.22–5.81)
RDW: 13.1 % (ref 11.5–15.5)
WBC: 8.6 10*3/uL (ref 4.0–10.5)
nRBC: 0 % (ref 0.0–0.2)

## 2022-03-26 LAB — TYPE AND SCREEN
ABO/RH(D): O POS
Antibody Screen: NEGATIVE

## 2022-03-26 LAB — BASIC METABOLIC PANEL
Anion gap: 9 (ref 5–15)
BUN: 9 mg/dL (ref 8–23)
CO2: 16 mmol/L — ABNORMAL LOW (ref 22–32)
Calcium: 6.7 mg/dL — ABNORMAL LOW (ref 8.9–10.3)
Chloride: 113 mmol/L — ABNORMAL HIGH (ref 98–111)
Creatinine, Ser: 0.56 mg/dL — ABNORMAL LOW (ref 0.61–1.24)
GFR, Estimated: >60 mL/min (ref >60)
Glucose, Bld: 87 mg/dL (ref 70–99)
Potassium: 3.6 mmol/L (ref 3.5–5.1)
Sodium: 138 mmol/L (ref 135–145)

## 2022-03-26 LAB — LACTIC ACID, PLASMA: Lactic Acid, Venous: 2.2 mmol/L (ref 0.5–1.9)

## 2022-03-26 LAB — HEPATIC FUNCTION PANEL
ALT: 74 U/L — ABNORMAL HIGH (ref 0–44)
AST: 313 U/L — ABNORMAL HIGH (ref 15–41)
Albumin: 4.2 g/dL (ref 3.5–5.0)
Alkaline Phosphatase: 72 U/L (ref 38–126)
Bilirubin, Direct: 0.5 mg/dL — ABNORMAL HIGH (ref 0.0–0.2)
Indirect Bilirubin: 1.8 mg/dL — ABNORMAL HIGH (ref 0.3–0.9)
Total Bilirubin: 2.3 mg/dL — ABNORMAL HIGH (ref 0.3–1.2)
Total Protein: 7.8 g/dL (ref 6.5–8.1)

## 2022-03-26 LAB — CBC
HCT: 25.8 % — ABNORMAL LOW (ref 39.0–52.0)
Hemoglobin: 8.6 g/dL — ABNORMAL LOW (ref 13.0–17.0)
MCH: 34.7 pg — ABNORMAL HIGH (ref 26.0–34.0)
MCHC: 33.3 g/dL (ref 30.0–36.0)
MCV: 104 fL — ABNORMAL HIGH (ref 80.0–100.0)
Platelets: 50 10*3/uL — ABNORMAL LOW (ref 150–400)
RBC: 2.48 MIL/uL — ABNORMAL LOW (ref 4.22–5.81)
RDW: 13 % (ref 11.5–15.5)
WBC: 5.4 10*3/uL (ref 4.0–10.5)
nRBC: 0 % (ref 0.0–0.2)

## 2022-03-26 LAB — PROCALCITONIN: Procalcitonin: 0.46 ng/mL

## 2022-03-26 LAB — CBG MONITORING, ED: Glucose-Capillary: 196 mg/dL — ABNORMAL HIGH (ref 70–99)

## 2022-03-26 LAB — GLUCOSE, CAPILLARY
Glucose-Capillary: 203 mg/dL — ABNORMAL HIGH (ref 70–99)
Glucose-Capillary: 165 mg/dL — ABNORMAL HIGH (ref 70–99)

## 2022-03-26 LAB — PROTIME-INR
INR: 1.7 — ABNORMAL HIGH (ref 0.8–1.2)
Prothrombin Time: 19.8 seconds — ABNORMAL HIGH (ref 11.4–15.2)

## 2022-03-26 LAB — PHOSPHORUS: Phosphorus: 2.5 mg/dL (ref 2.5–4.6)

## 2022-03-26 LAB — CORTISOL-AM, BLOOD: Cortisol - AM: 12.4 ug/dL (ref 6.7–22.6)

## 2022-03-26 LAB — AMMONIA: Ammonia: <10 umol/L (ref 9–35)

## 2022-03-26 LAB — MAGNESIUM: Magnesium: 2.3 mg/dL (ref 1.7–2.4)

## 2022-03-26 MED ORDER — VANCOMYCIN HCL IN DEXTROSE 1-5 GM/200ML-% IV SOLN
1000.0000 mg | Freq: Two times a day (BID) | INTRAVENOUS | Status: DC
  Administered 2022-03-26 – 2022-03-27 (×3): 1000 mg via INTRAVENOUS
  Filled 2022-03-26 (×3): qty 200

## 2022-03-26 MED ORDER — TAMSULOSIN HCL 0.4 MG PO CAPS
0.8000 mg | ORAL_CAPSULE | Freq: Every day | ORAL | Status: DC
  Administered 2022-03-26 – 2022-04-01 (×7): 0.8 mg via ORAL
  Filled 2022-03-26 (×7): qty 2

## 2022-03-26 MED ORDER — SODIUM CHLORIDE 0.9 % IV SOLN
2.0000 g | Freq: Three times a day (TID) | INTRAVENOUS | Status: DC
  Administered 2022-03-26 – 2022-03-27 (×3): 2 g via INTRAVENOUS
  Filled 2022-03-26: qty 12.5
  Filled 2022-03-26: qty 2
  Filled 2022-03-26 (×2): qty 12.5

## 2022-03-26 NOTE — Progress Notes (Addendum)
PROGRESS NOTE  Hector Miller Treat    DOB: 05-19-1945, 76 y.o.  VOH:[id number]    Code Status: DNR   DOA: 03/25/2022   LOS: 1   Brief hospital course  Hector Miller is a 76 y.o. male with a PMH significant for coronary artery disease, diabetes mellitus, hypertension, dyslipidemia, alcohol dependence.  They presented from [facility] to the ED on 03/25/2022 with fall out of bed and unable to stand from ground. He was on the floor overnight.   Denies any precipitating symptoms, LOC, or major injuries from the fall.  In the ED, it was found that they had tachycardia to 124, tachypnea to 22, and was normotensive and stable ORA. Afebrile. Significant findings included troponin of 101, serum glucose of 325, bicarbonate level of 11 with an anion gap of 25, lactic acid 8.9, white count of 13.6 Chest x-ray shows no acute findings.  They were initially treated with 3 L of IV fluids in the ER as well as a dose of cefepime, Flagyl and vancomycin. He also received 10 units of NovoLog.   Patient was admitted to medicine service for further workup and management of severe sepsis from unknown source as outlined in detail below.  03/26/22 -stable. Generalized weakness present  Assessment & Plan  Principal Problem:   Severe sepsis (HCC) Active Problems:   Type 2 diabetes mellitus with hyperglycemia (HCC)   AKI (acute kidney injury) (HCC)   Elevated troponin   Alcohol abuse   Depression   HTN (hypertension)   Gastroesophageal reflux disease without esophagitis   Coronary artery disease   Traumatic rhabdomyolysis (HCC)   High anion gap metabolic acidosis  Severe sepsis (HCC) criteria on presentation. Generalized weakness- Sepsis criteria have resolved. No known source on chest xray, skin, or urinalysis. Head CT negative for acute abnormalities. He is alert and oriented to baseline so do not suspect CNS infection. Has normal WBC and normal procalcitonin. Suspect multifactorial  in nature for initial presentation of fall and weakness including likely viral infection, acidosis in relation to DM, and possibly intoxication. CK elevated on arrival.  Am cortisol level normal today - follow LA, CK trends - continue IV fluids - electrolyte monitoring and replacement PRN - continue IV Abx and deescalate with cultures.  - PT/OT  Possible acute urinary retention on presentation? Foley placed. Good UOP - start flomax - remove foley for void trial and avoid infection source - strict I/O  Lower back pain- acute on chronic - heating pad - tylenol PRN - encourage OOB - voltaren gel   Type 2 diabetes mellitus with hyperglycemia (HCC)- A1c 6.8. glucose >500 in urinalysis. Positive anion gap on presentation now closed. Non-insulin dependent at baseline. Glycemic control with sliding scale insulin Maintain consistent carbohydrate diet   AKI- prerenal. Resolved to normal baseline with hydration.   Elevated troponin  CAD- denies chest pain. ECG shows NSR with dropped beats Demand mismatch. Troponins trended flat. Denied any LOC so arrhythmia less likely contributing to fall out of bed. Continue Crestor and place patient on low-dose aspirin - continue low-dose beta-blockers   Alcohol dependence- denies h/o withdrawals - continue thiamine/folic acid - continue CIWA protocol    Depression Continue Remeron and Lexapro   Traumatic rhabdomyolysis (HCC) Patient is status post fall with elevated CK levels Continue IV fluid resuscitation   Gastroesophageal reflux disease without esophagitis Continue PPI   Body mass index is 30.56 kg/m.  VTE ppx:  lovenox  Diet:     Diet  Diet Carb Modified Fluid consistency: Thin; Room service appropriate? Yes   Consultants: None  Subjective 03/26/22    Pt reports lower back pain. Denies CP, SOB, LE edema   Objective   Vitals:   03/26/22 0430 03/26/22 0500 03/26/22 0530 03/26/22 0600  BP:  (!) 105/58 103/76 123/68  Pulse:  87 90 84 70  Resp: 19 20 17  (!) 22  Temp: 98.9 F (37.2 C) 98.7 F (37.1 C) 98.6 F (37 C) 98.6 F (37 C)  TempSrc:      SpO2: 95% 96% 98% 95%  Weight:        Intake/Output Summary (Last 24 hours) at 03/26/2022 0747 Last data filed at 03/25/2022 2342 Gross per 24 hour  Intake 1200 ml  Output 1750 ml  Net -550 ml   Filed Weights   03/25/22 1157  Weight: 102.2 kg     Physical Exam:  General: awake, alert, NAD HEENT: atraumatic, clear conjunctiva, anicteric sclera, MMM, hearing grossly normal Respiratory: normal respiratory effort. Cardiovascular: quick capillary refill, normal S1/S2, RRR, no JVD, murmurs Gastrointestinal: soft, NT, ND Nervous: A&O x3. no gross focal neurologic deficits, normal speech Extremities: moves all equally, no edema, normal tone Skin: dry, intact, normal temperature, normal color. No rashes, lesions or ulcers on exposed skin Psychiatry: normal mood, congruent affect  Labs   I have personally reviewed the following labs and imaging studies CBC    Component Value Date/Time   WBC 8.6 03/26/2022 0644   RBC 4.07 (L) 03/26/2022 0644   HGB 14.3 03/26/2022 0644   HCT 41.5 03/26/2022 0644   PLT 99 (L) 03/26/2022 0644   MCV 102.0 (H) 03/26/2022 0644   MCH 35.1 (H) 03/26/2022 0644   MCHC 34.5 03/26/2022 0644   RDW 13.1 03/26/2022 0644   LYMPHSABS 0.9 03/26/2022 0644   MONOABS 0.7 03/26/2022 0644   EOSABS 0.1 03/26/2022 0644   BASOSABS 0.1 03/26/2022 0644      Latest Ref Rng & Units 03/26/2022    5:00 AM 03/25/2022    6:26 PM 03/25/2022    8:35 AM  BMP  Glucose 70 - 99 mg/dL 87  13/02/2022  161   BUN 8 - 23 mg/dL 9  15  14    Creatinine 0.61 - 1.24 mg/dL 096   0.45   Sodium 135 - 145 mmol/L 138  136  137   Potassium 3.5 - 5.1 mmol/L 3.6  4.0  4.1   Chloride 98 - 111 mmol/L 113  103  101   CO2 22 - 32 mmol/L 16  21  11    Calcium 8.9 - 10.3 mg/dL 6.7  8.6  4.09     CT HEAD WO CONTRAST (8.11)  Result Date: 03/25/2022 CLINICAL DATA:  Neuro  deficit EXAM: CT HEAD WITHOUT CONTRAST TECHNIQUE: Contiguous axial images were obtained from the base of the skull through the vertex without intravenous contrast. RADIATION DOSE REDUCTION: This exam was performed according to the departmental dose-optimization program which includes automated exposure control, adjustment of the mA and/or kV according to patient size and/or use of iterative reconstruction technique. COMPARISON:  CT brain 03/25/2022 FINDINGS: Brain: No acute territorial infarction, hemorrhage or intracranial mass. Atrophy and mild chronic small vessel ischemic changes of the white matter. Stable ventricle size. Chronic lacunar infarct versus dilated perivascular space in the right posterior sub insula. Small chronic right cerebellar infarcts. Vascular: No hyperdense vessels.  Carotid vascular calcification. Skull: Normal. Negative for fracture or focal lesion. Sinuses/Orbits: Mild mucosal thickening in the sinuses  Other: None IMPRESSION: 1. No CT evidence for acute intracranial abnormality. 2. Atrophy and chronic small vessel ischemic changes of the white matter. Electronically Signed   By: Jasmine Pang M.D.   On: 03/25/2022 18:22   CT Cervical Spine Wo Contrast  Result Date: 03/25/2022 CLINICAL DATA:  Neck trauma. EXAM: CT CERVICAL SPINE WITHOUT CONTRAST TECHNIQUE: Multidetector CT imaging of the cervical spine was performed without intravenous contrast. Multiplanar CT image reconstructions were also generated. RADIATION DOSE REDUCTION: This exam was performed according to the departmental dose-optimization program which includes automated exposure control, adjustment of the mA and/or kV according to patient size and/or use of iterative reconstruction technique. COMPARISON:  January 19, 2022 FINDINGS: Alignment: Normal. Skull base and vertebrae: No acute fracture. No primary bone lesion or focal pathologic process. Soft tissues and spinal canal: No prevertebral fluid or swelling. No visible  canal hematoma. Disc levels: Mild multilevel osteoarthritic changes. Right facet arthropathy at C3-C4 with right neural foraminal stenosis. Upper chest: Negative. Other: None. IMPRESSION: 1. No acute fracture or subluxation of the cervical spine. 2. Mild multilevel osteoarthritic changes. Right facet arthropathy at C3-C4 with right neural foraminal stenosis. Electronically Signed   By: Ted Mcalpine M.D.   On: 03/25/2022 09:26   CT HEAD WO CONTRAST ( )  Result Date: 03/25/2022 CLINICAL DATA:  Head trauma. EXAM: CT HEAD WITHOUT CONTRAST TECHNIQUE: Contiguous axial images were obtained from the base of the skull through the vertex without intravenous contrast. RADIATION DOSE REDUCTION: This exam was performed according to the departmental dose-optimization program which includes automated exposure control, adjustment of the mA and/or kV according to patient size and/or use of iterative reconstruction technique. COMPARISON:  January 19, 2022 FINDINGS: Brain: No evidence of acute infarction, hemorrhage, hydrocephalus, extra-axial collection or mass lesion/mass effect. Marked brain parenchymal volume loss and moderate deep white matter microangiopathy. Vascular: No hyperdense vessel or unexpected calcification. Skull: Normal. Negative for fracture or focal lesion. Sinuses/Orbits: No acute finding. Other: None. IMPRESSION: 1. No acute intracranial abnormality. 2. Marked brain parenchymal volume loss and moderate deep white matter microangiopathy. Electronically Signed   By: Ted Mcalpine M.D.   On: 03/25/2022 09:23   DG Chest Portable 1 View  Result Date: 03/25/2022 CLINICAL DATA:  Sepsis. EXAM: PORTABLE CHEST 1 VIEW COMPARISON:  May 13, 2021. FINDINGS: The heart size and mediastinal contours are within normal limits. Sternotomy wires are noted. Both lungs are clear. The visualized skeletal structures are unremarkable. IMPRESSION: No active disease. Electronically Signed   By: Lupita Raider  M.D.   On: 03/25/2022 09:14    Disposition Plan & Communication  Patient status: Inpatient  Admitted From: ALF Planned disposition location: Assisted living Anticipated discharge date: 11/12 pending further workup  Family Communication: none    Author: Leeroy Bock, DO [facility] 03/26/2022, 7:47 AM   Available by Epic secure chat 7AM-7PM. If 7PM-7AM, please contact night-coverage.  [facility] contact information found on [email or web address].

## 2022-03-26 NOTE — TOC Progression Note (Signed)
Transition of Care [facility]) - Progression Note    Patient Details  Name: Hector Miller MRN: [id number] Date of Birth: 05-14-1946  Transition of Care [facility]) CM/SW Contact  Bing Quarry, RN Phone Number: 03/26/2022, 4:37 PM  Clinical Narrative: 11/11: Substance Abuse (ETOH) resources/education information added to AVS summary. Gabriel Cirri RN CM            Expected Discharge Plan and Services                                                 Social Determinants of Health (SDOH) Interventions    Readmission Risk Interventions    05/16/2021   11:08 AM  Readmission Risk Prevention Plan  Post Dischage Appt Complete  Medication Screening Complete  Transportation Screening Complete

## 2022-03-26 NOTE — Plan of Care (Signed)
  Problem: Clinical Measurements: Goal: Ability to maintain clinical measurements within normal limits will improve Outcome: Progressing Goal: Respiratory complications will improve Outcome: Progressing   

## 2022-03-26 NOTE — Progress Notes (Signed)
This RN provided an update to patient's niece, Gilles Chiquito, with patient's permission.   [phone]

## 2022-03-26 NOTE — Progress Notes (Signed)
Pharmacy Antibiotic Note  Hector Miller is a 76 y.o. male admitted on 03/25/2022 with sepsis.  Pharmacy has been consulted for vancomycin and cefepime dosing.  Assessment: 76 y.o. male with a past medical history of CAD, diabetes, hypertension, hyperlipidemia presents to the emergency department after a fall and syncopal episode.  Vancomycin 1000 mg IV x1 and Cefepime 2 gm IV x1 in ED  Plan: Pt received a total of vancomycin 2500 mg on 11/10. Due to improvement in Scr will adjust vancomycin to 1000 mg q12H. Predicted AUC of 522. Goal AUC of 400-550. Vd 0.5 (BMI > 30), Scr used 0.8. IBW. Plan to obtain levels after 4th or 5th dose if still continued.   Will adjust cefepime 2 g q12H to cefepime 2g q8H.     Weight: 102.2 kg (225 lb 5 oz)  Temp (24hrs), Avg:99.1 F (37.3 C), Min:98.4 F (36.9 C), Max:99.5 F (37.5 C)  Recent Labs  Lab 03/25/22 0834 03/25/22 0835 03/25/22 1058 03/25/22 1107 03/25/22 1826 03/26/22 0500 03/26/22 0644  WBC  --  13.6*  --   --  9.5 5.4 8.6  CREATININE  --  1.52*  --   --  1.11 0.56*  --   LATICACIDVEN 8.9*  --  5.2* 2.2*  --   --   --      Estimated Creatinine Clearance: 95.6 mL/min (A) (by C-G formula based on SCr of 0.56 mg/dL (L)).    Allergies  Allergen Reactions   Lisinopril Cough    Antimicrobials this admission: Vancomycin 11/10 >>  Cefepime 11/10 >>  Metronidazole 11/10 >>  Dose adjustments this admission: N/A  Microbiology results: 11/10 BCx: pending   Thank you for allowing pharmacy to be a part of this patient's care.  Ronnald Ramp, PharmD, BCPS 03/26/2022 9:58 AM

## 2022-03-26 NOTE — ED Notes (Signed)
Pt is unable to answer all questions during CIWA assessment.  Pt is lethargic and difficult to wake at times, but at other times pt wakes to light noise.  This is consistent with the transfer of care report this RN received.

## 2022-03-26 NOTE — Progress Notes (Addendum)
       CROSS COVER NOTE  NAME: Hector Miller MRN: [id number] DOB : 1946-02-24  Patient with serious drop in hemoglobin from 14 to 8 and red els 4 to 2. May be dilutional but blood obtained peripheral IV. Will have lab obtain blood from peripheral stick to confirm result.  Type and screen ordered as well as mag and phosphorous levels, vbg ammonia, and hepatic function panel

## 2022-03-27 ENCOUNTER — Inpatient Hospital Stay: Payer: No Typology Code available for payment source

## 2022-03-27 DIAGNOSIS — R338 Other retention of urine: Secondary | ICD-10-CM

## 2022-03-27 DIAGNOSIS — E8729 Other acidosis: Secondary | ICD-10-CM | POA: Diagnosis not present

## 2022-03-27 DIAGNOSIS — M5440 Lumbago with sciatica, unspecified side: Secondary | ICD-10-CM

## 2022-03-27 DIAGNOSIS — F10930 Alcohol use, unspecified with withdrawal, uncomplicated: Secondary | ICD-10-CM | POA: Diagnosis not present

## 2022-03-27 DIAGNOSIS — R531 Weakness: Secondary | ICD-10-CM

## 2022-03-27 DIAGNOSIS — A419 Sepsis, unspecified organism: Secondary | ICD-10-CM | POA: Diagnosis not present

## 2022-03-27 DIAGNOSIS — E860 Dehydration: Secondary | ICD-10-CM | POA: Diagnosis not present

## 2022-03-27 LAB — RETICULOCYTES
Immature Retic Fract: 16.7 % — ABNORMAL HIGH (ref 2.3–15.9)
RBC.: 3.69 MIL/uL — ABNORMAL LOW (ref 4.22–5.81)
Retic Count, Absolute: 61.6 10*3/uL (ref 19.0–186.0)
Retic Ct Pct: 1.7 % (ref 0.4–3.1)

## 2022-03-27 LAB — BASIC METABOLIC PANEL
Anion gap: 6 (ref 5–15)
BUN: 10 mg/dL (ref 8–23)
CO2: 20 mmol/L — ABNORMAL LOW (ref 22–32)
Calcium: 7.6 mg/dL — ABNORMAL LOW (ref 8.9–10.3)
Chloride: 108 mmol/L (ref 98–111)
Creatinine, Ser: 0.8 mg/dL (ref 0.61–1.24)
GFR, Estimated: >60 mL/min (ref >60)
Glucose, Bld: 140 mg/dL — ABNORMAL HIGH (ref 70–99)
Potassium: 3.4 mmol/L — ABNORMAL LOW (ref 3.5–5.1)
Sodium: 134 mmol/L — ABNORMAL LOW (ref 135–145)

## 2022-03-27 LAB — BLOOD GAS, VENOUS
Acid-base deficit: 6.2 mmol/L — ABNORMAL HIGH (ref 0.0–2.0)
Bicarbonate: 19 mmol/L — ABNORMAL LOW (ref 20.0–28.0)
O2 Saturation: 43.8 %
Patient temperature: 37
pCO2, Ven: 36 mmHg — ABNORMAL LOW (ref 44–60)
pH, Ven: 7.33 (ref 7.25–7.43)

## 2022-03-27 LAB — VITAMIN B12: Vitamin B-12: 189 pg/mL (ref 180–914)

## 2022-03-27 LAB — CBC
HCT: 33.7 % — ABNORMAL LOW (ref 39.0–52.0)
Hemoglobin: 11.7 g/dL — ABNORMAL LOW (ref 13.0–17.0)
MCH: 34.8 pg — ABNORMAL HIGH (ref 26.0–34.0)
MCHC: 34.7 g/dL (ref 30.0–36.0)
MCV: 100.3 fL — ABNORMAL HIGH (ref 80.0–100.0)
Platelets: 66 10*3/uL — ABNORMAL LOW (ref 150–400)
RBC: 3.36 MIL/uL — ABNORMAL LOW (ref 4.22–5.81)
RDW: 12.8 % (ref 11.5–15.5)
WBC: 6 10*3/uL (ref 4.0–10.5)
nRBC: 0 % (ref 0.0–0.2)

## 2022-03-27 LAB — CK: Total CK: 5777 U/L — ABNORMAL HIGH (ref 49–397)

## 2022-03-27 LAB — IRON AND TIBC
Iron: 53 ug/dL (ref 45–182)
Saturation Ratios: 19 % (ref 17.9–39.5)
TIBC: 284 ug/dL (ref 250–450)
UIBC: 231 ug/dL

## 2022-03-27 LAB — GLUCOSE, CAPILLARY
Glucose-Capillary: 153 mg/dL — ABNORMAL HIGH (ref 70–99)
Glucose-Capillary: 194 mg/dL — ABNORMAL HIGH (ref 70–99)
Glucose-Capillary: 229 mg/dL — ABNORMAL HIGH (ref 70–99)

## 2022-03-27 LAB — FERRITIN: Ferritin: 227 ng/mL (ref 24–336)

## 2022-03-27 LAB — FOLATE: Folate: 34 ng/mL (ref >5.9)

## 2022-03-27 MED ORDER — GADOBUTROL 1 MMOL/ML IV SOLN
9.0000 mL | Freq: Once | INTRAVENOUS | Status: AC | PRN
  Administered 2022-03-27: 10 mL via INTRAVENOUS

## 2022-03-27 MED ORDER — SODIUM CHLORIDE 0.9 % IV SOLN
3.0000 g | Freq: Four times a day (QID) | INTRAVENOUS | Status: DC
  Administered 2022-03-27 – 2022-03-28 (×2): 3 g via INTRAVENOUS
  Filled 2022-03-27 (×4): qty 8

## 2022-03-27 MED ORDER — HYDROMORPHONE HCL 1 MG/ML IJ SOLN
0.5000 mg | INTRAMUSCULAR | Status: DC | PRN
  Administered 2022-03-27 – 2022-03-28 (×2): 0.5 mg via INTRAVENOUS
  Filled 2022-03-27 (×2): qty 1

## 2022-03-27 NOTE — Plan of Care (Signed)
  Problem: Health Behavior/Discharge Planning: Goal: Ability to manage health-related needs will improve Outcome: Progressing   Problem: Clinical Measurements: Goal: Will remain free from infection Outcome: Progressing   Problem: Clinical Measurements: Goal: Respiratory complications will improve Outcome: Progressing   Problem: Coping: Goal: Level of anxiety will decrease Outcome: Progressing   Problem: Elimination: Goal: Will not experience complications related to bowel motility Outcome: Progressing

## 2022-03-27 NOTE — Plan of Care (Signed)
  Problem: Education: Goal: Knowledge of General Education information will improve Description: Including pain rating scale, medication(s)/side effects and non-pharmacologic comfort measures Outcome: Progressing   Problem: Health Behavior/Discharge Planning: Goal: Ability to manage health-related needs will improve Outcome: Progressing   Problem: Clinical Measurements: Goal: Ability to maintain clinical measurements within normal limits will improve Outcome: Progressing Goal: Will remain free from infection Outcome: Progressing Goal: Diagnostic test results will improve Outcome: Progressing Goal: Respiratory complications will improve Outcome: Progressing Goal: Cardiovascular complication will be avoided Outcome: Progressing   Problem: Activity: Goal: Risk for activity intolerance will decrease Outcome: Progressing   Problem: Nutrition: Goal: Adequate nutrition will be maintained Outcome: Progressing   Problem: Coping: Goal: Level of anxiety will decrease Outcome: Progressing   Problem: Elimination: Goal: Will not experience complications related to bowel motility Outcome: Progressing Goal: Will not experience complications related to urinary retention Outcome: Progressing   Problem: Pain Managment: Goal: General experience of comfort will improve Outcome: Progressing   Problem: Safety: Goal: Ability to remain free from injury will improve Outcome: Progressing   Problem: Skin Integrity: Goal: Risk for impaired skin integrity will decrease Outcome: Progressing   Problem: Fluid Volume: Goal: Hemodynamic stability will improve Outcome: Progressing   Problem: Clinical Measurements: Goal: Diagnostic test results will improve Outcome: Progressing Goal: Signs and symptoms of infection will decrease Outcome: Progressing   Problem: Respiratory: Goal: Ability to maintain adequate ventilation will improve Outcome: Progressing   Problem: Education: Goal: Ability  to describe self-care measures that may prevent or decrease complications (Diabetes Survival Skills Education) will improve Outcome: Progressing Goal: Individualized Educational Video(s) Outcome: Progressing   Problem: Coping: Goal: Ability to adjust to condition or change in health will improve Outcome: Progressing   Problem: Fluid Volume: Goal: Ability to maintain a balanced intake and output will improve Outcome: Progressing   Problem: Metabolic: Goal: Ability to maintain appropriate glucose levels will improve Outcome: Progressing   Problem: Nutritional: Goal: Maintenance of adequate nutrition will improve Outcome: Progressing Goal: Progress toward achieving an optimal weight will improve Outcome: Progressing   Problem: Skin Integrity: Goal: Risk for impaired skin integrity will decrease Outcome: Progressing   Problem: Tissue Perfusion: Goal: Adequacy of tissue perfusion will improve Outcome: Progressing

## 2022-03-27 NOTE — Progress Notes (Signed)
       CROSS COVER NOTE  NAME: Hector Miller MRN: [id number] DOB : Mar 11, 1946 ATTENDING PHYSICIAN: Leeroy Bock, MD    Date of Service   03/27/2022   HPI/Events of Note   Message received from pharmacy notifying me of elevated CK of 5777 at 1307 today. CK is increased from 782 two days ago. Pharmacist recommends holding Crestor at this time.  Interventions   Assessment/Plan:  Hold Crestor tonight Day team to decide on appropriate time to restart/appropriate alternative med    This document was prepared using Dragon voice recognition software and may include unintentional dictation errors.  Bishop Limbo DNP, MBA, FNP-BC Nurse Practitioner [facility] Pager [phone]

## 2022-03-27 NOTE — Evaluation (Signed)
Occupational Therapy Evaluation Patient Details Name: Hector Miller MRN: [id number] DOB: 1946-02-11 Today's Date: 03/27/2022   History of Present Illness Pt admitted to [facility] on 03/25/22 for syncopal event and mechanical fall; unable to stand from the ground and was on the floor all night. Found to be hyperglycemic, tachycardic, and tachypneic, upon EMS arrival. Dx with severe sepsis. Elevated troponin levels secondary to demand ischemia from tachycardia. Significant PMH includes: CAD s/p CABG, DM, HTN, HLD, and alcohol abuse. Imaging negative for acute abnormalities.   Clinical Impression   Pt agreeable to OT/PT co-treatment to maximize safety and participation. Pt presenting with decreased independence in self care, balance, functional mobility/transfers, and endurance. Prior to admission, pt was Mod I for ADLs/IADLs, used a rollator within the home, and a SPC in the community. He lives at [facility] community. Pt currently functioning at Max-total A +2 for supine<>sit and Max A +2 for standing attempts although he was unable to achieve fully upright standing position. He required Max A for LB dressing. Pt endorsing significant low back pain (10/10) which limited his performance/activity tolerance during evaluation. Pt will benefit from acute OT to increase overall independence in the areas of ADLs and functional mobility in order to safely discharge to next venue of care. Upon hospital discharge, recommend STR to maximize pt safety and return to PLOF.     Recommendations for follow up therapy are one component of a multi-disciplinary discharge planning process, led by the attending physician.  Recommendations may be updated based on patient status, additional functional criteria and insurance authorization.   Follow Up Recommendations  Skilled nursing-short term rehab (<3 hours/day)    Assistance Recommended at Discharge Frequent or constant Supervision/Assistance  Patient can  return home with the following Two people to help with walking and/or transfers;Two people to help with bathing/dressing/bathroom;Assistance with cooking/housework;Direct supervision/assist for medications management;Help with stairs or ramp for entrance;Assist for transportation    Functional Status Assessment  Patient has had a recent decline in their functional status and demonstrates the ability to make significant improvements in function in a reasonable and predictable amount of time.  Equipment Recommendations  Other (comment) (defer to next venue of care)    Recommendations for Other Services       Precautions / Restrictions Precautions Precautions: Fall Restrictions Weight Bearing Restrictions: No Other Position/Activity Restrictions: DNR, Strict I/O      Mobility Bed Mobility Overal bed mobility: Needs Assistance Bed Mobility: Supine to Sit, Sit to Supine     Supine to sit: +2 for physical assistance, Total assist Sit to supine: Max assist, +2 for physical assistance   General bed mobility comments: attempted log roll for pain management however pt unable to tolerate 2/2 significant back pain    Transfers Overall transfer level: Needs assistance Equipment used: Rolling walker (2 wheels) Transfers: Sit to/from Stand Sit to Stand: Max assist, +2 physical assistance           General transfer comment: 2x STS attempts, unable to acheive fully upright standing position, multimodal cues for hand placement & sequencing, VC for anterior weight shift      Balance Overall balance assessment: Needs assistance Sitting-balance support: Bilateral upper extremity supported, Feet supported Sitting balance-Leahy Scale: Fair Sitting balance - Comments: initially poor, progressing to fair with BUE support Postural control: Posterior lean Standing balance support: Bilateral upper extremity supported, Reliant on assistive device for balance, During functional activity Standing  balance-Leahy Scale: Zero Standing balance comment: max assist +2 in standing  with RW                           ADL either performed or assessed with clinical judgement   ADL Overall ADL's : Needs assistance/impaired                     Lower Body Dressing: Maximal assistance;Sitting/lateral leans Lower Body Dressing Details (indicate cue type and reason): unable 2/2 low back pain and BLE weakness (difficulty lifting against gravity) Toilet Transfer: Rolling walker (2 wheels);+2 for physical assistance;Maximal assistance Toilet Transfer Details (indicate cue type and reason): simulated with STS from EOB Toileting- Clothing Manipulation and Hygiene: Total assistance;Bed level               Vision Patient Visual Report: No change from baseline       Perception     Praxis      Pertinent Vitals/Pain Pain Assessment Pain Assessment: 0-10 Pain Score: 10-Worst pain ever Pain Location: low back; increased pain behaviors with gross functional mobility and LE movement Pain Descriptors / Indicators: Aching, Dull, Sharp, Restless Pain Intervention(s): Limited activity within patient's tolerance, Monitored during session, Repositioned     Hand Dominance Right   Extremity/Trunk Assessment Upper Extremity Assessment Upper Extremity Assessment: Generalized weakness   Lower Extremity Assessment Lower Extremity Assessment: Generalized weakness (pt reported decreased sensation in L foot (endorsed having periperheral neuropathy))   Cervical / Trunk Assessment Cervical / Trunk Assessment: Kyphotic   Communication Communication Communication: No difficulties   Cognition Arousal/Alertness: Awake/alert Behavior During Therapy: WFL for tasks assessed/performed Overall Cognitive Status: Within Functional Limits for tasks assessed                                 General Comments: A&Ox4     General Comments  increased pain with bed adjustments,  functional mobility, and LE movement    Exercises Other Exercises Other Exercises: OT provided education re: role of OT, OT POC, post acute recs, sitting up for all meals, EOB/OOB mobility with assistance, home/fall safety.     Shoulder Instructions      Home Living Family/patient expects to be discharged to:: Skilled nursing facility Living Arrangements: Alone                               Additional Comments: From [employer].      Prior Functioning/Environment Prior Level of Function : Independent/Modified Independent;Driving;History of Falls (last six months)             Mobility Comments: Mod I with rollator for household ambulation and SPC for limited community ambulation. ADLs Comments: Mod I with ADL's, simple meals, driving, IADL's, and medication management. [facility] provide 1 meal a day but pt prefers to make his own simple meals/eat out.        OT Problem List: Decreased strength;Pain;Impaired balance (sitting and/or standing);Decreased activity tolerance;Decreased range of motion;Impaired sensation      OT Treatment/Interventions: Self-care/ADL training;Therapeutic activities;Therapeutic exercise;DME and/or AE instruction;Patient/family education;Balance training    OT Goals(Current goals can be found in the care plan section) Acute Rehab OT Goals Patient Stated Goal: reduce pain and return to PLOF OT Goal Formulation: With patient Time For Goal Achievement: 04/10/22 Potential to Achieve Goals: Fair   OT Frequency: Min 2X/week    Co-evaluation PT/OT/SLP Co-Evaluation/Treatment: Yes  Reason for Co-Treatment: Complexity of the patient's impairments (multi-system involvement);For patient/therapist safety;To address functional/ADL transfers PT goals addressed during session: Mobility/safety with mobility OT goals addressed during session: ADL's and self-care      AM-PAC OT "6 Clicks" Daily Activity     Outcome Measure  Help from another person eating meals?: A Little (set up) Help from another person taking care of personal grooming?: A Little Help from another person toileting, which includes using toliet, bedpan, or urinal?: Total Help from another person bathing (including washing, rinsing, drying)?: Total Help from another person to put on and taking off regular upper body clothing?: A Little Help from another person to put on and taking off regular lower body clothing?: A Lot 6 Click Score: 13   End of Session Equipment Utilized During Treatment: Gait belt;Rolling walker (2 wheels) Nurse Communication: Mobility status  Activity Tolerance: Patient limited by pain Patient left: in bed;with call bell/phone within reach;with bed alarm set  OT Visit Diagnosis: Unsteadiness on feet (R26.81);History of falling (Z91.81);Muscle weakness (generalized) (M62.81);Pain Pain - part of body:  (low back)                Time: 6808-8110 OT Time Calculation (min): 20 min Charges:  OT General Charges $OT Visit: 1 Visit OT Evaluation $OT Eval Low Complexity: 1 Low  [facility] MS, OTR/L ascom [phone]  03/27/22, 2:04 PM

## 2022-03-27 NOTE — Consult Note (Signed)
Consulting Department:  Inpatient medicine  Primary Physician:  [facility]  Chief Complaint: Fracture of the spine   History of Present Illness: 03/27/2022 Hector Miller is a 76 y.o. male who presents with the chief complaint of sepsis of unknown origin.  Consultation was made to neurosurgery given his difficulty with standing and walking.  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  He has both alcoholic dependence as well as diabetes.  He states that his neuropathy started in his toes and has gotten as far up as to slightly proximal to his knees.  He states that intermittently he will feel this into his fingertips.  He also has a history of back pain and was seen by an outside surgeon who discussed possible decompression versus conservative management.  He was admitted for generalized weakness.  Given his difficulty with standing the team initiated the spinal work-up.  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  He denies any new weakness to his lower extremities.  He also states that he has not noticed any new change in sensation changes.  Feels that he still has sensation to his perineum.  Has not had any new bowel or bladder issues.  Review of Systems:  A 10 point review of systems is negative, except for the pertinent positives and negatives detailed in the HPI.  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   CYST REMOVAL HAND Left    TONSILLECTOMY       Allergies: Allergies as of 03/25/2022 - Review Complete 03/25/2022  Allergen Reaction Noted   Lisinopril Cough 06/10/2020    Medications:  Current Facility-Administered Medications:    acetaminophen (TYLENOL) tablet 650 mg, 650 mg, Oral, Q6H PRN, 650 mg at 03/26/22 0252 **OR** acetaminophen (TYLENOL) suppository 650 mg, 650 mg, Rectal, Q6H PRN, Agbata, Tochukwu, MD   albuterol (PROVENTIL) (2.5 MG/3ML) 0.083% nebulizer solution 2.5 mg, 2.5 mg, Inhalation, Q6H PRN, Agbata, Tochukwu, MD   Ampicillin-Sulbactam (UNASYN) 3 g in sodium chloride 0.9 % 100 mL IVPB, 3 g, Intravenous, Q6H, Anderson, Chelsey L, MD   aspirin chewable tablet 81 mg, 81 mg, Oral, Daily, Agbata, Tochukwu, MD, 81 mg at 03/27/22 0925   cholecalciferol (VITAMIN D3) 25 MCG (1000 UNIT) tablet 1,000 Units, 1,000 Units, Oral, Daily, Agbata, Tochukwu, MD, 1,000 Units at 03/27/22 0925   enoxaparin (LOVENOX) injection 50 mg, 0.5 mg/kg, Subcutaneous, Q24H, Agbata, Tochukwu, MD, 50 mg at 03/27/22 1533   finasteride (PROSCAR) tablet 5 mg, 5 mg, Oral, Daily, Agbata, Tochukwu, MD, 5 mg at 03/27/22 0321   folic acid (FOLVITE) tablet 1 mg, 1 mg, Oral, Daily, Agbata, Tochukwu, MD, 1 mg at 03/27/22 0925   gabapentin (NEURONTIN) capsule 300 mg, 300 mg, Oral, TID, Agbata, Tochukwu, MD, 300 mg at 03/27/22 1533   HYDROmorphone (DILAUDID) injection 0.5 mg, 0.5 mg, Intravenous, Q4H PRN, Leeroy Bock, MD, 0.5 mg at 03/27/22 1759  insulin aspart (novoLOG) injection 0-15 Units, 0-15 Units, Subcutaneous, TID WC, Agbata, Tochukwu, MD, 5 Units at 03/27/22 1708   [EXPIRED] LORazepam (ATIVAN) tablet 0-4 mg, 0-4 mg, Oral, Q4H **FOLLOWED BY** LORazepam (ATIVAN) tablet 0-4 mg, 0-4 mg, Oral, Q8H, Agbata, Tochukwu, MD   metoprolol tartrate (LOPRESSOR) tablet 12.5 mg, 12.5 mg, Oral, BID, Agbata, Tochukwu, MD, 12.5 mg at 03/26/22 2150   mirtazapine (REMERON) tablet 30 mg, 30 mg, Oral, QHS, Agbata, Tochukwu, MD, 30 mg at 03/26/22 2151   ondansetron  (ZOFRAN) tablet 4 mg, 4 mg, Oral, Q6H PRN **OR** ondansetron (ZOFRAN) injection 4 mg, 4 mg, Intravenous, Q6H PRN, Agbata, Tochukwu, MD   pantoprazole (PROTONIX) EC tablet 40 mg, 40 mg, Oral, Daily, Agbata, Tochukwu, MD, 40 mg at 03/27/22 0926   polyethylene glycol (MIRALAX / GLYCOLAX) packet 17 g, 17 g, Oral, Daily, Agbata, Tochukwu, MD, 17 g at 03/27/22 0926   rosuvastatin (CRESTOR) tablet 20 mg, 20 mg, Oral, QHS, Agbata, Tochukwu, MD, 20 mg at 03/26/22 2151   tamsulosin (FLOMAX) capsule 0.8 mg, 0.8 mg, Oral, Daily, Jamelle RushingAnderson, Chelsey L, MD, 0.8 mg at 03/27/22 [id number]   thiamine (VITAMIN B1) tablet 100 mg, 100 mg, Oral, Daily, Agbata, Tochukwu, MD, 100 mg at 03/27/22 0925   traZODone (DESYREL) tablet 100 mg, 100 mg, Oral, QHS PRN, Agbata, Tochukwu, MD, 100 mg at 03/27/22 0149   Social History: Social History   Tobacco Use   Smoking status: Former    Types: Cigarettes, Software engineerCigars, Pipe    Quit date: 03/26/1970    Years since quitting: 52.0   Smokeless tobacco: Former  Building services engineerVaping Use   Vaping Use: Never used  Substance Use Topics   Alcohol use: Yes    Alcohol/week: 0.0 standard drinks of alcohol    Comment: daily-5-6 shots of liquor   Drug use: No    Family Medical History: Family History  Problem Relation Age of Onset   Cancer Mother        Breast   Parkinson's disease Mother    Heart failure Father    Cancer Father        Stomach   Stroke Father    Hypertension Father     Physical Examination: Vitals:   03/27/22 0425 03/27/22 0748  BP: 97/60 113/69  Pulse: 96 95  Resp:  17  Temp:  98.2 F (36.8 C)  SpO2: 94% 92%     General: Patient is well developed, well nourished, calm, collected, and in no apparent distress.  NEUROLOGICAL:  General: In no acute distress.  Does have pain with excess movements Awake, alert, oriented to person, place, and time.  Pupils equal round and reactive to light.  Facial tone is symmetric.  Tongue protrusion is midline.   ROM of spine: Diffusely  tender to palpation throughout the thoracolumbar spine  Strength:  Side Iliopsoas Quads Thigh adduction PF DF EHL  R 4* 5 5 5 5 5   L 4* 5 5 5  4+ 4+  *Pain on activation causing some limitation.  He has tingling and paresthesias in his bilateral lower extremities starting at the feet and going all the way up to the proximal knee. Reflexes are trace throughout the bilateral lower extremities  Clonus is not present.  Toes are mute  Imaging: Narrative & Impression  CLINICAL DATA:  Fall, radiculopathy   EXAM: LUMBAR SPINE - 2-3 VIEW   COMPARISON:  None Available.   FINDINGS: No fracture or dislocation of the lumbar spine. Straightening of the normal lumbar lordosis with severe  multilevel disc space height loss and osteophytosis throughout. Moderate to severe facet degenerative change of the lower lumbar levels. Nonobstructive pattern of overlying bowel gas.   IMPRESSION: 1. No fracture or dislocation of the lumbar spine. 2. Straightening of the normal lumbar lordosis with severe multilevel disc space height loss and osteophytosis throughout.     Electronically Signed   By: Jearld Lesch M.D.   On: 03/27/2022 11:53     Narrative & Impression  CLINICAL DATA:  Lumbar radiculopathy, trauma   EXAM: MRI LUMBAR SPINE WITHOUT AND WITH CONTRAST   TECHNIQUE: Multiplanar and multiecho pulse sequences of the lumbar spine were obtained without and with intravenous contrast.   CONTRAST:  24mL GADAVIST GADOBUTROL 1 MMOL/ML IV SOLN   COMPARISON:  Lumbar radiographs from the same day.   FINDINGS: Segmentation: Standard segmentation is assumed. The inferior-most fully formed intervertebral disc is labeled L5-S1.   Alignment:  Straightening.  No substantial sagittal subluxation.   Vertebrae: Linear T1 hypointense fracture plane through the T12 vertebral body with irregularity of the inferior cortex in this region and associated bone marrow edema, compatible with acute/recent  fracture. Slight involvement of the right pedicle. Mild height loss. Degenerative/discogenic endplate signal changes at multiple levels.   Conus medullaris and cauda equina: Conus extends to the T11-T12. Level. Conus and cauda equina appear normal.   Paraspinal and other soft tissues: Unremarkable.   Disc levels:   T12-L1: Mild facet arthropathy and mild disc bulging. No significant stenosis.   L1-L2: Disc bulge with mild bilateral facet arthropathy. Mild subarticular recess narrowing without significant canal or foraminal stenosis.   L2-L3: Broad disc bulge with prominence of the dorsal epidural fat resulting moderate canal stenosis and moderate bilateral foraminal stenosis.   L3-L4: Broad disc bulge with bilateral facet arthropathy. Resulting mild to moderate canal stenosis with mild right and moderate left foraminal stenosis.   L4-L5: Right eccentric disc bulge with right greater than left facet arthropathy. Resulting moderate to severe right subarticular recess stenosis and right foraminal stenosis. Patent central canal. Mild left foraminal stenosis.   L5-S1: Mild disc bulging and facet arthropathy without significant stenosis.   IMPRESSION: 1. Findings compatible with acute/recent fracture of the T12 vertebral body with mild height loss, involvement of the endplates, and mild involvement of the right pedicle. No bony retropulsion. 2. At L2-L3, moderate canal and bilateral foraminal stenosis. 3. At L4-L5, moderate to severe right subarticular recess and foraminal stenosis with potential for impingement. 4. At L3-L4, mild to moderate canal stenosis with mild right and moderate left foraminal stenosis     Electronically Signed   By: Feliberto Harts M.D.   On: 03/27/2022 13:41       I have personally reviewed the images and agree with the above interpretation.  Labs:    Latest Ref Rng & Units 03/27/2022    5:07 AM 03/26/2022    6:44 AM 03/26/2022    5:00  AM  CBC  WBC 4.0 - 10.5 K/uL 6.0  8.6  5.4   Hemoglobin 13.0 - 17.0 g/dL 57.3  22.0  8.6   Hematocrit 39.0 - 52.0 % 33.7  41.5  25.8   Platelets 150 - 400 K/uL 66  99  50        Assessment and Plan: Mr. Stumph is a pleasant 76 y.o. male with recent admission for possible sepsis and diffuse weakness.  He said he also had a history of a fall.  Past history includes diabetes and alcohol dependence.  Reason for consultation was T12 compression fracture noted on MRI as well as some central and lateral recess stenosis most significant at L2-3.  He does state that he has had chronic back issues for many years.  He has previously seen a spine surgeon at an outside hospital.  He does get some claudication symptoms with heaviness in his legs when walking and he states that this is been present for many years.  Currently he does have pretty significant back pain with midline palpation tenderness throughout the thoracolumbar spine.  He does have weakness on hip flexion, however this does appear to be mostly pain dependent.  Distally he has good strength.  His sensation decreases in descending manner consistent with length dependent polyneuropathy.  At this point I do not think he would benefit from a emergent decompression, but given the level of stenosis at 2 3 he may require a outpatient decompression for his claudication.  In regards to his T12 fracture, this was not clearly seen on his x-rays, given the extension into the pedicle on the MRI it may be helpful to review this with a CT scan in a nonemergent fashion.  We will follow-up on that film.  I have discussed the condition with the patient, including showing the radiographs and discussing treatment options in layman's terms.     Lovenia Kim, MD/MSCR Dept. of Neurosurgery

## 2022-03-27 NOTE — Progress Notes (Signed)
PROGRESS NOTE  Hector Miller    DOB: 05/08/1946, 76 y.o.  WUJ:[id number]    Code Status: DNR   DOA: 03/25/2022   LOS: 2   Brief hospital course  Hector Miller is a 76 y.o. male with a PMH significant for coronary artery disease, diabetes mellitus, hypertension, dyslipidemia, alcohol dependence.  They presented from [facility] to the ED on 03/25/2022 with fall out of bed and unable to stand from ground. He was on the floor overnight.   Denies any precipitating symptoms, CP, SOB, LOC, or major injuries from the fall. He simply just wasn't able to stand up. Has had worsening lower back pain for months with acute worsening recently.   In the ED, it was found that they had tachycardia to 124, tachypnea to 22, and was normotensive and stable ORA. Afebrile. Significant findings included troponin of 101, serum glucose of 325, bicarbonate level of 11 with an anion gap of 25, lactic acid 8.9, white count of 13.6. urinalysis glucose >500, hgb large but negative for RBC (likely from elevated CK in prolonged time down), not indicative of infection.  Chest x-ray shows no acute findings. Head and neck CT were negative for acute changes.   They were initially treated for severe sepsis with unknown source with 3 L of IV fluids in the ER as well as a dose of cefepime, Flagyl and vancomycin. He also received 10 units of NovoLog.   Patient was admitted to medicine service for further workup and management of severe sepsis from unknown source as outlined in detail below.  03/27/22 -without a clear source of infection and lacking typical signs of infection, changing gears a bit today. Patient's main complaint is his lower back pain and states he can hardly move or feel his legs. Negative for bowel or bladder incontinence. He had possible urine retention on presentation but was able to void yesterday when added on flomax and removed foley. Today, he is again having urine retention.    Assessment & Plan  Principal Problem:   Severe sepsis (HCC) Active Problems:   Type 2 diabetes mellitus with hyperglycemia (HCC)   AKI (acute kidney injury) (HCC)   Elevated troponin   Alcohol abuse   Depression   HTN (hypertension)   Gastroesophageal reflux disease without esophagitis   Coronary artery disease   Traumatic rhabdomyolysis (HCC)   High anion gap metabolic acidosis   Sepsis (HCC)   Alcohol withdrawal syndrome without complication (HCC)   Dehydration  Severe sepsis (HCC) criteria on presentation. Generalized weakness- Sepsis criteria have resolved. No known source on chest xray, skin, or urinalysis. Head CT negative for acute abnormalities. He is alert and oriented to baseline so do not suspect CNS infection. Has normal WBC and normal procalcitonin. Suspect multifactorial in nature for initial presentation of fall and weakness including likely viral infection, acidosis in relation to DM, and possibly intoxication. CK elevated on arrival.  Am cortisol level normal. - electrolyte monitoring and replacement PRN - continue IV Abx and deescalate with cultures.  - PT/OT  Lower back pain- acute on chronic. Patient describes more to me the acute nature in which his function declined prior to admission which makes me have concerns for possible cord compression, severe radiculopathy. Unable to see past imaging of spine in chart or care everywhere. Weakness from hip flexors and distal. Describes sensation loss in lower extremities worse on left. Normal to decreased reflexes bilaterally. Now proved to have urinary retention again which is new  for him and further causes alarm. Analgesia PRN - heating pad - tylenol PRN - voltaren gel - encourage OOB - PT/OT - lumbar spine xray and MRI  Possible acute urinary retention on presentation? Foley placed. Good UOP. Removed yesterday and able to void but has now had retention again. >500cc on bladder scan and unable to void.  - continue  flomax - continue foley - strict I/O - urology consult in or outpatient pending imaging results   Type 2 diabetes mellitus with hyperglycemia (HCC)- A1c 6.8. glucose >500 in urinalysis. Positive anion gap on presentation now closed. Non-insulin dependent at baseline. Glycemic control with sliding scale insulin Maintain consistent carbohydrate diet   AKI- prerenal. Resolved to normal baseline with hydration.   Elevated troponin  CAD- denies chest pain. ECG shows NSR with dropped beats Demand mismatch. Troponins trended flat. Denied any LOC so arrhythmia less likely contributing to fall out of bed. Continue Crestor and place patient on low-dose aspirin - continue low-dose beta-blockers   Alcohol dependence- denies h/o withdrawals. Denies current tremors - continue thiamine/folic acid - continue CIWA protocol    Depression Continue Remeron and Lexapro   Traumatic rhabdomyolysis (HCC)- resolving Patient is status post fall with elevated CK levels   Gastroesophageal reflux disease without esophagitis Continue PPI   Body mass index is 28.48 kg/m.  VTE ppx:  lovenox  Diet:     Diet   Diet Carb Modified Fluid consistency: Thin; Room service appropriate? Yes   Consultants: None   Subjective 03/27/22    Pt reports lower back pain which is not improving. Now having urinary retention. States that his weakness is an acute change from his baseline. Denies CP, SOB, LE edema   Objective   Vitals:   03/26/22 2003 03/26/22 2326 03/27/22 0425 03/27/22 0748  BP: 106/64 108/68 97/60 113/69  Pulse: 94 93 96 95  Resp: 20 18  17   Temp: 98.8 F (37.1 C) 98.2 F (36.8 C)  98.2 F (36.8 C)  TempSrc: Oral     SpO2: 94% 94% 94% 92%  Weight:      Height:        Intake/Output Summary (Last 24 hours) at 03/27/2022 0833 Last data filed at 03/27/2022 0753 Gross per 24 hour  Intake 3924.11 ml  Output 1950 ml  Net 1974.11 ml    Filed Weights   03/25/22 1157 03/26/22 1051   Weight: 102.2 kg 95.3 kg     Physical Exam:  General: awake, alert, NAD HEENT: atraumatic, clear conjunctiva, anicteric sclera, MMM, hearing grossly normal Respiratory: normal respiratory effort. Cardiovascular: quick capillary refill Nervous: A&O x3. normal speech Decreased strength bilateral lower extremities, left worse than right. Reduced to normal reflexes. Sensation decreased worse on left Extremities:no edema, normal tone Skin: dry, intact, normal temperature, normal color. No rashes, lesions or ulcers on exposed skin Psychiatry: normal mood, congruent affect  Labs   I have personally reviewed the following labs and imaging studies CBC    Component Value Date/Time   WBC 6.0 03/27/2022 0507   RBC 3.36 (L) 03/27/2022 0507   HGB 11.7 (L) 03/27/2022 0507   HCT 33.7 (L) 03/27/2022 0507   PLT 66 (L) 03/27/2022 0507   MCV 100.3 (H) 03/27/2022 0507   MCH 34.8 (H) 03/27/2022 0507   MCHC 34.7 03/27/2022 0507   RDW 12.8 03/27/2022 0507   LYMPHSABS 0.9 03/26/2022 0644   MONOABS 0.7 03/26/2022 0644   EOSABS 0.1 03/26/2022 0644   BASOSABS 0.1 03/26/2022 13/03/2022  Latest Ref Rng & Units 03/27/2022    5:07 AM 03/26/2022    5:00 AM 03/25/2022    6:26 PM  BMP  Glucose 70 - 99 mg/dL 409  87  811   BUN 8 - 23 mg/dL 10  9  15    Creatinine 0.61 - 1.24 mg/dL  9.14  7.82   Sodium 135 - 145 mmol/L 134  138  136   Potassium 3.5 - 5.1 mmol/L 3.4  3.6  4.0   Chloride 98 - 111 mmol/L 108  113  103   CO2 22 - 32 mmol/L 20  16  21    Calcium 8.9 - 10.3 mg/dL 7.6  6.7  8.6     CT HEAD WO CONTRAST (9.56)  Result Date: 03/25/2022 CLINICAL DATA:  Neuro deficit EXAM: CT HEAD WITHOUT CONTRAST TECHNIQUE: Contiguous axial images were obtained from the base of the skull through the vertex without intravenous contrast. RADIATION DOSE REDUCTION: This exam was performed according to the departmental dose-optimization program which includes automated exposure control, adjustment of the mA and/or  kV according to patient size and/or use of iterative reconstruction technique. COMPARISON:  CT brain 03/25/2022 FINDINGS: Brain: No acute territorial infarction, hemorrhage or intracranial mass. Atrophy and mild chronic small vessel ischemic changes of the white matter. Stable ventricle size. Chronic lacunar infarct versus dilated perivascular space in the right posterior sub insula. Small chronic right cerebellar infarcts. Vascular: No hyperdense vessels.  Carotid vascular calcification. Skull: Normal. Negative for fracture or focal lesion. Sinuses/Orbits: Mild mucosal thickening in the sinuses Other: None IMPRESSION: 1. No CT evidence for acute intracranial abnormality. 2. Atrophy and chronic small vessel ischemic changes of the white matter. Electronically Signed   By: 13/02/2022 M.D.   On: 03/25/2022 18:22   CT Cervical Spine Wo Contrast  Result Date: 03/25/2022 CLINICAL DATA:  Neck trauma. EXAM: CT CERVICAL SPINE WITHOUT CONTRAST TECHNIQUE: Multidetector CT imaging of the cervical spine was performed without intravenous contrast. Multiplanar CT image reconstructions were also generated. RADIATION DOSE REDUCTION: This exam was performed according to the departmental dose-optimization program which includes automated exposure control, adjustment of the mA and/or kV according to patient size and/or use of iterative reconstruction technique. COMPARISON:  January 19, 2022 FINDINGS: Alignment: Normal. Skull base and vertebrae: No acute fracture. No primary bone lesion or focal pathologic process. Soft tissues and spinal canal: No prevertebral fluid or swelling. No visible canal hematoma. Disc levels: Mild multilevel osteoarthritic changes. Right facet arthropathy at C3-C4 with right neural foraminal stenosis. Upper chest: Negative. Other: None. IMPRESSION: 1. No acute fracture or subluxation of the cervical spine. 2. Mild multilevel osteoarthritic changes. Right facet arthropathy at C3-C4 with right neural  foraminal stenosis. Electronically Signed   By: 13/02/2022 M.D.   On: 03/25/2022 09:26   CT HEAD WO CONTRAST (Ted Mcalpine)  Result Date: 03/25/2022 CLINICAL DATA:  Head trauma. EXAM: CT HEAD WITHOUT CONTRAST TECHNIQUE: Contiguous axial images were obtained from the base of the skull through the vertex without intravenous contrast. RADIATION DOSE REDUCTION: This exam was performed according to the departmental dose-optimization program which includes automated exposure control, adjustment of the mA and/or kV according to patient size and/or use of iterative reconstruction technique. COMPARISON:  January 19, 2022 FINDINGS: Brain: No evidence of acute infarction, hemorrhage, hydrocephalus, extra-axial collection or mass lesion/mass effect. Marked brain parenchymal volume loss and moderate deep white matter microangiopathy. Vascular: No hyperdense vessel or unexpected calcification. Skull: Normal. Negative for fracture or focal lesion. Sinuses/Orbits: No  acute finding. Other: None. IMPRESSION: 1. No acute intracranial abnormality. 2. Marked brain parenchymal volume loss and moderate deep white matter microangiopathy. Electronically Signed   By: Ted Mcalpineobrinka  Dimitrova M.D.   On: 03/25/2022 09:23   DG Chest Portable 1 View  Result Date: 03/25/2022 CLINICAL DATA:  Sepsis. EXAM: PORTABLE CHEST 1 VIEW COMPARISON:  May 13, 2021. FINDINGS: The heart size and mediastinal contours are within normal limits. Sternotomy wires are noted. Both lungs are clear. The visualized skeletal structures are unremarkable. IMPRESSION: No active disease. Electronically Signed   By: Lupita RaiderJames  Green Jr M.D.   On: 03/25/2022 09:14    Disposition Plan & Communication  Patient status: Inpatient  Admitted From: ALF Planned disposition location: Assisted living Anticipated discharge date: 11/14 pending further workup  Family Communication: none    Author: Leeroy Bockhelsey L Izsak Meir, DO [facility] 03/27/2022, 8:33 AM    Available by Epic secure chat 7AM-7PM. If 7PM-7AM, please contact night-coverage.  [facility] contact information found on [email or web address].

## 2022-03-27 NOTE — Evaluation (Signed)
Physical Therapy Evaluation Patient Details Name: Hector Miller MRN: [id number] DOB: [birth year] Today's Date: 03/27/2022  History of Present Illness  Pt admitted to [facility] on 03/25/22 for syncopal event and mechanical fall; unable to stand from the ground and was on the floor all night. Found to be hyperglycemic, tachycardic, and tachypneic, upon EMS arrival. Dx with severe sepsis. Elevated troponin levels secondary to demand ischemia from tachycardia. Significant PMH includes: CAD s/p CABG, DM, HTN, HLD, and alcohol abuse. Imaging negative for acute abnormalities.   Clinical Impression  Pt is a 76 year old M admitted to hospital on 03/25/22 for severe sepsis. At baseline, pt is mod I with ADL's, simple IADL's, ambulation with rollator and SPC, driving, and medication management.   Pt presents with generalized weakness, increased pain levels, decreased gross balance, and decreased activity tolerance, resulting in impaired functional mobility from baseline. Due to deficits, pt required max-total assist +2 for bed mobility and max assist +2 for transfers with RW. Due to significant LBP, pt was unable to achieve full upright standing despite max assist/multimodal cues at EOB. Further mobility and assessment limited secondary to increased pain levels/behaviors with bed adjustments, LE movement, and overall functional mobility. Deficits limit the pt's ability to safely and independently perform ADL's, transfer, and ambulate.   Pt will benefit from acute skilled PT services to address deficits for return to baseline function. At this time, PT recommends SNF at DC to address deficits and improve overall safety with functional mobility prior to return home.        Recommendations for follow up therapy are one component of a multi-disciplinary discharge planning process, led by the attending physician.  Recommendations may be updated based on patient status, additional functional criteria and insurance  authorization.  Follow Up Recommendations Skilled nursing-short term rehab (<3 hours/day) Can patient physically be transported by private vehicle: No    Assistance Recommended at Discharge Frequent or constant Supervision/Assistance  Patient can return home with the following  Two people to help with walking and/or transfers;A lot of help with bathing/dressing/bathroom;Assistance with cooking/housework;Direct supervision/assist for medications management;Assist for transportation;Help with stairs or ramp for entrance    Equipment Recommendations  (defer to post acute)     Functional Status Assessment Patient has had a recent decline in their functional status and demonstrates the ability to make significant improvements in function in a reasonable and predictable amount of time.     Precautions / Restrictions Precautions Precautions: Fall Restrictions Weight Bearing Restrictions: No Other Position/Activity Restrictions: DNR, Strict I/O      Mobility  Bed Mobility Overal bed mobility: Needs Assistance Bed Mobility: Supine to Sit, Sit to Supine     Supine to sit: +2 for physical assistance, Total assist Sit to supine: Max assist, +2 for physical assistance   General bed mobility comments: attempted log roll for pain management however pt unable due to significant pain. max-total +2 for trunk and BLE facilitation; increased cues    Transfers Overall transfer level: Needs assistance Equipment used: Rolling walker (2 wheels) Transfers: Sit to/from Stand Sit to Stand: Max assist, +2 physical assistance           General transfer comment: for power to stand with multimodal cues for set up, hand placement, and hip extension. unable to achieve full upright standing. x2 attempt from EOB; unable to maintain static standing longer than 10s    Ambulation/Gait Ambulation/Gait assistance:  (unable to assess due to safety concerns and pain)  Balance  Overall balance assessment: Needs assistance Sitting-balance support: Bilateral upper extremity supported, Feet supported Sitting balance-Leahy Scale: Fair Sitting balance - Comments: initially poor, progressing to fair with BUE support Postural control: Posterior lean Standing balance support: Bilateral upper extremity supported, Reliant on assistive device for balance, During functional activity Standing balance-Leahy Scale: Zero Standing balance comment: max assist +2 in standing with RW                             Pertinent Vitals/Pain Pain Assessment Pain Assessment: 0-10 Pain Score: 10-Worst pain ever Pain Location: low back; increased pain behaviors with gross functional mobility and LE movement Pain Descriptors / Indicators: Aching, Dull, Sharp, Restless Pain Intervention(s): Limited activity within patient's tolerance, Monitored during session, Repositioned    Home Living Family/patient expects to be discharged to:: Skilled nursing facility                   Additional Comments: From [employer].    Prior Function Prior Level of Function : Independent/Modified Independent;Driving;History of Falls (last six months)             Mobility Comments: Mod I with rollator for household ambulation and SPC for limited community ambulation. ADLs Comments: Mod I with ADL's, simple meals, driving, IADL's, and medication management. [facility] provide 1 meal a day but pt prefers to make his own simple meals/eat out.     Hand Dominance   Dominant Hand: Right    Extremity/Trunk Assessment   Upper Extremity Assessment Upper Extremity Assessment: Defer to OT evaluation    Lower Extremity Assessment Lower Extremity Assessment: Generalized weakness (not formally assessed due to significant back pain; unable to perform SLR in supine and required max assis to stand without BLE buckling. sensation intact except L5 which could be attributed to  chronic neuropathy)    Cervical / Trunk Assessment Cervical / Trunk Assessment: Kyphotic  Communication   Communication: No difficulties  Cognition Arousal/Alertness: Awake/alert Behavior During Therapy: WFL for tasks assessed/performed Overall Cognitive Status: Within Functional Limits for tasks assessed                                 General Comments: A&O x4; able to follow 100% of simple 3-step commands        General Comments General comments (skin integrity, edema, etc.): increased pain with bed adjustments, functional mobility, and LE movement    Exercises Other Exercises Other Exercises: Participates in bed mobility and transfers with significant assist due to pain; unable to ambulate today secondary to deficits. Other Exercises: Educated re: PT role/POC, DC recommendations, safety with mobility, benefits of mobility.   Assessment/Plan    PT Assessment Patient needs continued PT services  PT Problem List Decreased strength;Decreased range of motion;Decreased activity tolerance;Decreased balance;Decreased mobility;Pain       PT Treatment Interventions DME instruction;Gait training;Functional mobility training;Therapeutic activities;Therapeutic exercise;Balance training;Neuromuscular re-education;Manual techniques    PT Goals (Current goals can be found in the Care Plan section)  Acute Rehab PT Goals Patient Stated Goal: "to get everything better" PT Goal Formulation: With patient Time For Goal Achievement: 04/10/22 Potential to Achieve Goals: Fair    Frequency Min 2X/week     Co-evaluation PT/OT/SLP Co-Evaluation/Treatment: Yes Reason for Co-Treatment: Complexity of the patient's impairments (multi-system involvement);For patient/therapist safety;To address functional/ADL transfers PT goals addressed during session: Mobility/safety with mobility OT goals  addressed during session: ADL's and self-care       AM-PAC PT "6 Clicks" Mobility   Outcome Measure Help needed turning from your back to your side while in a flat bed without using bedrails?: Total Help needed moving from lying on your back to sitting on the side of a flat bed without using bedrails?: Total Help needed moving to and from a bed to a chair (including a wheelchair)?: Total Help needed standing up from a chair using your arms (e.g., wheelchair or bedside chair)?: A Lot Help needed to walk in hospital room?: Total Help needed climbing 3-5 steps with a railing? : Total 6 Click Score: 7    End of Session Equipment Utilized During Treatment: Gait belt Activity Tolerance: Patient limited by pain Patient left: in bed;with call bell/phone within reach;with chair alarm set Nurse Communication: Mobility status PT Visit Diagnosis: Unsteadiness on feet (R26.81);History of falling (Z91.81);Difficulty in walking, not elsewhere classified (R26.2);Muscle weakness (generalized) (M62.81);Pain Pain - Right/Left:  (back)    Time: [id number] PT Time Calculation (min) (ACUTE ONLY): 19 min   Charges:   PT Evaluation $PT Eval Low Complexity: 1 Low PT Treatments $Therapeutic Activity: 8-22 mins       Herminio Commons, PT, DPT 1:29 PM,03/27/22 Physical Therapist - [facility]

## 2022-03-28 DIAGNOSIS — S32008S Other fracture of unspecified lumbar vertebra, sequela: Secondary | ICD-10-CM

## 2022-03-28 DIAGNOSIS — F10930 Alcohol use, unspecified with withdrawal, uncomplicated: Secondary | ICD-10-CM | POA: Diagnosis not present

## 2022-03-28 DIAGNOSIS — E8729 Other acidosis: Secondary | ICD-10-CM | POA: Diagnosis not present

## 2022-03-28 DIAGNOSIS — S32009A Unspecified fracture of unspecified lumbar vertebra, initial encounter for closed fracture: Secondary | ICD-10-CM

## 2022-03-28 DIAGNOSIS — M5410 Radiculopathy, site unspecified: Secondary | ICD-10-CM

## 2022-03-28 DIAGNOSIS — E860 Dehydration: Secondary | ICD-10-CM | POA: Diagnosis not present

## 2022-03-28 LAB — COMPREHENSIVE METABOLIC PANEL
ALT: 51 U/L — ABNORMAL HIGH (ref 0–44)
AST: 116 U/L — ABNORMAL HIGH (ref 15–41)
Albumin: 2.8 g/dL — ABNORMAL LOW (ref 3.5–5.0)
Alkaline Phosphatase: 60 U/L (ref 38–126)
Anion gap: 8 (ref 5–15)
BUN: 10 mg/dL (ref 8–23)
CO2: 24 mmol/L (ref 22–32)
Calcium: 8.5 mg/dL — ABNORMAL LOW (ref 8.9–10.3)
Chloride: 105 mmol/L (ref 98–111)
Creatinine, Ser: 0.84 mg/dL (ref 0.61–1.24)
GFR, Estimated: >60 mL/min (ref >60)
Glucose, Bld: 186 mg/dL — ABNORMAL HIGH (ref 70–99)
Potassium: 3.4 mmol/L — ABNORMAL LOW (ref 3.5–5.1)
Sodium: 137 mmol/L (ref 135–145)
Total Bilirubin: 1.2 mg/dL (ref 0.3–1.2)
Total Protein: 6 g/dL — ABNORMAL LOW (ref 6.5–8.1)

## 2022-03-28 LAB — CBC
HCT: 36.7 % — ABNORMAL LOW (ref 39.0–52.0)
Hemoglobin: 12.7 g/dL — ABNORMAL LOW (ref 13.0–17.0)
MCH: 34.6 pg — ABNORMAL HIGH (ref 26.0–34.0)
MCHC: 34.6 g/dL (ref 30.0–36.0)
MCV: 100 fL (ref 80.0–100.0)
Platelets: 80 10*3/uL — ABNORMAL LOW (ref 150–400)
RBC: 3.67 MIL/uL — ABNORMAL LOW (ref 4.22–5.81)
RDW: 12.8 % (ref 11.5–15.5)
WBC: 6.1 10*3/uL (ref 4.0–10.5)
nRBC: 0 % (ref 0.0–0.2)

## 2022-03-28 LAB — GLUCOSE, CAPILLARY
Glucose-Capillary: 165 mg/dL — ABNORMAL HIGH (ref 70–99)
Glucose-Capillary: 174 mg/dL — ABNORMAL HIGH (ref 70–99)
Glucose-Capillary: 187 mg/dL — ABNORMAL HIGH (ref 70–99)

## 2022-03-28 LAB — CK: Total CK: 3058 U/L — ABNORMAL HIGH (ref 49–397)

## 2022-03-28 MED ORDER — ENOXAPARIN SODIUM 40 MG/0.4ML IJ SOSY
40.0000 mg | PREFILLED_SYRINGE | INTRAMUSCULAR | Status: DC
  Administered 2022-03-28 – 2022-03-31 (×4): 40 mg via SUBCUTANEOUS
  Filled 2022-03-28 (×4): qty 0.4

## 2022-03-28 MED ORDER — POTASSIUM CHLORIDE IN NACL 40-0.9 MEQ/L-% IV SOLN
INTRAVENOUS | Status: DC
  Filled 2022-03-28 (×6): qty 1000

## 2022-03-28 NOTE — TOC Progression Note (Signed)
Transition of Care [facility]) - Progression Note    Patient Details  Name: Hector Miller MRN: [id number] Date of Birth: 1945-12-13  [facility]) CM/SW Contact  Marlowe Sax, RN Phone Number: 03/28/2022, 2:16 PM  Clinical Narrative:    No bed offers yet, resent the search and expanded to all of the Hub, will review bed offers once obtained        Expected Discharge Plan and Services                                                 Social Determinants of Health (SDOH) Interventions    Readmission Risk Interventions    05/16/2021   11:08 AM  Readmission Risk Prevention Plan  Post Dischage Appt Complete  Medication Screening Complete  Transportation Screening Complete

## 2022-03-28 NOTE — Plan of Care (Signed)
  Problem: Education: Goal: Knowledge of General Education information will improve Description: Including pain rating scale, medication(s)/side effects and non-pharmacologic comfort measures Outcome: Progressing   Problem: Health Behavior/Discharge Planning: Goal: Ability to manage health-related needs will improve Outcome: Progressing   Problem: Clinical Measurements: Goal: Ability to maintain clinical measurements within normal limits will improve Outcome: Progressing Goal: Will remain free from infection Outcome: Progressing Goal: Diagnostic test results will improve Outcome: Progressing Goal: Respiratory complications will improve Outcome: Progressing Goal: Cardiovascular complication will be avoided Outcome: Progressing   Problem: Activity: Goal: Risk for activity intolerance will decrease Outcome: Progressing   Problem: Nutrition: Goal: Adequate nutrition will be maintained Outcome: Progressing   Problem: Coping: Goal: Level of anxiety will decrease Outcome: Progressing   Problem: Elimination: Goal: Will not experience complications related to bowel motility Outcome: Progressing Goal: Will not experience complications related to urinary retention Outcome: Progressing   Problem: Pain Managment: Goal: General experience of comfort will improve Outcome: Progressing   Problem: Safety: Goal: Ability to remain free from injury will improve Outcome: Progressing   Problem: Skin Integrity: Goal: Risk for impaired skin integrity will decrease Outcome: Progressing   Problem: Fluid Volume: Goal: Hemodynamic stability will improve Outcome: Progressing   Problem: Clinical Measurements: Goal: Diagnostic test results will improve Outcome: Progressing Goal: Signs and symptoms of infection will decrease Outcome: Progressing   Problem: Respiratory: Goal: Ability to maintain adequate ventilation will improve Outcome: Progressing   Problem: Education: Goal: Ability  to describe self-care measures that may prevent or decrease complications (Diabetes Survival Skills Education) will improve Outcome: Progressing Goal: Individualized Educational Video(s) Outcome: Progressing   Problem: Fluid Volume: Goal: Ability to maintain a balanced intake and output will improve Outcome: Progressing   Problem: Health Behavior/Discharge Planning: Goal: Ability to identify and utilize available resources and services will improve Outcome: Progressing Goal: Ability to manage health-related needs will improve Outcome: Progressing   Problem: Metabolic: Goal: Ability to maintain appropriate glucose levels will improve Outcome: Progressing   Problem: Nutritional: Goal: Maintenance of adequate nutrition will improve Outcome: Progressing Goal: Progress toward achieving an optimal weight will improve Outcome: Progressing   Problem: Skin Integrity: Goal: Risk for impaired skin integrity will decrease Outcome: Progressing   Problem: Tissue Perfusion: Goal: Adequacy of tissue perfusion will improve Outcome: Progressing

## 2022-03-28 NOTE — Progress Notes (Signed)
PROGRESS NOTE  Hector Miller    DOB: [birth year], 76 y.o.  HKV:[id number]    Code Status: DNR   DOA: 03/25/2022   LOS: 3   Brief hospital course  Hector Miller is a 76 y.o. male with a PMH significant for coronary artery disease, diabetes mellitus, hypertension, dyslipidemia, alcohol dependence.  They [facility] to the ED on 03/25/2022 with fall out of bed and unable to stand from ground. He was on the floor overnight.   Denies any precipitating symptoms, CP, SOB, LOC, or major injuries from the fall. He simply just wasn't able to stand up. Has had worsening lower back pain for months with acute worsening recently.   In the ED, it was found that they had tachycardia to 124, tachypnea to 22, and was normotensive and stable ORA. Afebrile. Significant findings included troponin of 101, serum glucose of 325, bicarbonate level of 11 with an anion gap of 25, lactic acid 8.9, white count of 13.6. urinalysis glucose >500, hgb large but negative for RBC (likely from elevated CK in prolonged time down), not indicative of infection.  Chest x-ray shows no acute findings. Head and neck CT were negative for acute changes.   They were initially treated for severe sepsis with unknown source with 3 L of IV fluids in the ER as well as a dose of cefepime, Flagyl and vancomycin. He also received 10 units of NovoLog.   Patient was admitted to medicine service for further workup and management of severe sepsis from unknown source as outlined in detail below.  11/12- without a clear source of infection and lacking typical signs of infection, discontinued the sepsis course started on presentation. Patient's main complaint is his lower back pain and states he can hardly move or feel his legs. Negative for bowel or bladder incontinence. He is having urine retention.  T12/L1? Fracture (see CT results). Neurosurgery consulted  03/28/22 - dispo pending neurosurgery plan. PT/OT  evaluation and SNF placement  Assessment & Plan  Principal Problem:   Severe sepsis (HCC) Active Problems:   Type 2 diabetes mellitus with hyperglycemia (HCC)   AKI (acute kidney injury) (HCC)   Elevated troponin   Alcohol abuse   Depression   HTN (hypertension)   Gastroesophageal reflux disease without esophagitis   Coronary artery disease   Traumatic rhabdomyolysis (HCC)   High anion gap metabolic acidosis   Sepsis (HCC)   Alcohol withdrawal syndrome without complication (HCC)   Dehydration   Acute urinary retention   Acute low back pain with sciatica  Severe sepsis (HCC) criteria on presentation. Generalized weakness- Sepsis criteria have resolved. No known source on chest xray, skin, or urinalysis. Head CT negative for acute abnormalities. He is alert and oriented to baseline so do not suspect CNS infection. Has normal WBC and normal procalcitonin. Suspect multifactorial in nature for initial presentation of fall and weakness including likely viral infection, acidosis in relation to DM, and possibly intoxication. CK elevated on arrival.  Am cortisol level normal. CK levels increased while admitted and patient was on statin medication which was discontinued due to possible statin induced myositis. - electrolyte monitoring and replacement PRN - continue IV Abx and deescalate with cultures.  - PT/OT - discontinue statin therapy  Lower back pain- acute on chronic. Patient describes more to me the acute nature in which his function declined prior to admission which makes me have concerns for possible cord compression, severe radiculopathy. Unable to see past imaging of  spine in chart or care everywhere. Weakness from hip flexors and distal. Describes sensation loss in lower extremities worse on left. Normal to decreased reflexes bilaterally. Now proved to have urinary retention again which is new for him and further causes alarm. Lumbar xray negative for fracture but shows severe  multilevel disc space height loss. Lumbar MRI shows significant degenerative changes in lumbar spine including stenosis of canal and foramen and fracture of T12. CT thoracic spine to further evaluate the fracture shows that patient actually has extra vertebrae T12/L1 and technically 6 lumbar.  - Analgesia PRN - heating pad - tylenol PRN - voltaren gel - encourage OOB - PT/OT - neurosurgery following, appreciate recs  Acute urinary retention-Foley placed. Good UOP. - continue flomax - continue foley - strict I/O - urology consult in or outpatient pending imaging results   Type 2 diabetes mellitus with hyperglycemia (HCC)- A1c 6.8. glucose >500 in urinalysis. Positive anion gap on presentation now closed. Non-insulin dependent at baseline. Glycemic control with sliding scale insulin Maintain consistent carbohydrate diet   AKI- prerenal. Resolved to normal baseline with hydration.   Elevated troponin  CAD- denies chest pain. ECG shows NSR with dropped beats Demand mismatch. Troponins trended flat. Denied any LOC so arrhythmia less likely contributing to fall out of bed. Continue Crestor and place patient on low-dose aspirin - continue low-dose beta-blockers   Alcohol dependence- denies h/o withdrawals. Denies current tremors - continue thiamine/folic acid - continue CIWA protocol    Depression Continue Remeron and Lexapro   Traumatic rhabdomyolysis (HCC)-worsening Patient is status post fall with elevated CK levels - monitor CK levels - IV fluids   Gastroesophageal reflux disease without esophagitis Continue PPI   Body mass index is 28.48 kg/m.  VTE ppx:  lovenox  Diet:     Diet   Diet Carb Modified Fluid consistency: Thin; Room service appropriate? Yes   Consultants: Neurosurgery    Subjective 03/28/22    Pt reports lower back pain stable. All questions and concerns addressed at time of encounter   Objective   Vitals:   03/27/22 2116 03/27/22 2237 03/28/22  0343 03/28/22 0744  BP: 108/73 103/68 124/74 118/71  Pulse: 98 84 88 90  Resp: (!) 22  20   Temp:   98.2 F (36.8 C) 98.8 F (37.1 C)  TempSrc:   Oral   SpO2: 94%  98% 95%  Weight:      Height:        Intake/Output Summary (Last 24 hours) at 03/28/2022 0814 Last data filed at 03/28/2022 0525 Gross per 24 hour  Intake 495.3 ml  Output 2200 ml  Net -1704.7 ml    Filed Weights   03/25/22 1157 03/26/22 1051  Weight: 102.2 kg 95.3 kg     Physical Exam:  General: awake, alert, NAD HEENT: atraumatic, clear conjunctiva, anicteric sclera, MMM, hearing grossly normal Respiratory: normal respiratory effort. Cardiovascular: quick capillary refill Nervous: A&O x3. normal speech Decreased strength bilateral lower extremities, right worse than left Reduced to normal reflexes. Sensation decreased worse on right Extremities:no edema, normal tone Skin: dry, intact, normal temperature, normal color. No rashes, lesions or ulcers on exposed skin Psychiatry: normal mood, congruent affect  Labs   I have personally reviewed the following labs and imaging studies CBC    Component Value Date/Time   WBC 6.1 03/28/2022 0518   RBC 3.67 (L) 03/28/2022 0518   HGB 12.7 (L) 03/28/2022 0518   HCT 36.7 (L) 03/28/2022 0518   PLT 80 (L)  03/28/2022 0518   MCV 100.0 03/28/2022 0518   MCH 34.6 (H) 03/28/2022 0518   MCHC 34.6 03/28/2022 0518   RDW 12.8 03/28/2022 0518   LYMPHSABS 0.9 03/26/2022 0644   MONOABS 0.7 03/26/2022 0644   EOSABS 0.1 03/26/2022 0644   BASOSABS 0.1 03/26/2022 0644      Latest Ref Rng & Units 03/28/2022    5:18 AM 03/27/2022    5:07 AM 03/26/2022    5:00 AM  BMP  Glucose 70 - 99 mg/dL 220  254  87   BUN 8 - 23 mg/dL 10  10  9    Creatinine 0.61 - 1.24 mg/dL  2.70  6.23   Sodium 135 - 145 mmol/L 137  134  138   Potassium 3.5 - 5.1 mmol/L 3.4  3.4  3.6   Chloride 98 - 111 mmol/L 105  108  113   CO2 22 - 32 mmol/L 24  20  16    Calcium 8.9 - 10.3 mg/dL 8.5  7.6  6.7      CT THORACIC SPINE WO CONTRAST  Result Date: 03/27/2022 CLINICAL DATA:  Fall 1 week ago EXAM: CT THORACIC SPINE WITHOUT CONTRAST TECHNIQUE: Multidetector CT images of the thoracic were obtained using the standard protocol without intravenous contrast. RADIATION DOSE REDUCTION: This exam was performed according to the departmental dose-optimization program which includes automated exposure control, adjustment of the mA and/or kV according to patient size and/or use of iterative reconstruction technique. COMPARISON:  No prior CT thoracic spine, correlation is made with MRI lumbar spine 04/04/2022 FINDINGS: Alignment: No listhesis. Preservation of the normal thoracic kyphosis. Vertebrae: Please note there is transitional anatomy. Counting from the skull base on the 03/25/2022 CT cervical spine and 12 rib-bearing vertebral bodies on this study. The previously labeled T12 vertebral body is actually L1, and it consists of a miniature rib on the right and a lumbar-type transverse process on the left. This means that there are 5 additional lumbar-type vertebral bodies, the most inferior of which to be designated L6. Redemonstrated acute fracture of L1, as seen on the same-day MRI lumbar spine. There is approximately 20% vertebral body height loss at present. No retropulsion. The fracture does involve the posterior cortex of T12, but does not appear to involve the pedicles or posterior elements, although abnormal signal was seen extending into the right pedicle on the MRI. No additional acute fracture in the thoracic spine. Paraspinal and other soft tissues: Aortic atherosclerosis. Trace bilateral pleural effusions with associated atelectasis. Disc levels: No significant spinal canal stenosis or neural foraminal narrowing. IMPRESSION: 1. Please note there is transitional anatomy, with the previously labeled T12 vertebral body actually L1 when counting from the skull base. L1 has a miniature rib on the right and a  lumbar-type transverse process on the left. This means that there are 5 additional lumbar-type vertebral bodies, the most inferior of which could be designated L6. Please correlate with imaging if any intervention is planned. 2. Redemonstrated acute fracture of L1, as seen on the same-day MRI lumbar spine. There is approximately 20% vertebral body height loss at present. No retropulsion. The fracture does involve the posterior cortex of T12, but does not appear to involve the pedicles or posterior elements, although abnormal signal was seen extending into the right pedicle on the MRI. 3. No additional acute fracture in the thoracic spine. 4. Aortic atherosclerosis. Aortic Atherosclerosis (ICD10-I70.0). Electronically Signed   By: 04/06/2022 M.D.   On: 03/27/2022 20:04   MR  Lumbar Spine W Wo Contrast  Result Date: 03/27/2022 CLINICAL DATA:  Lumbar radiculopathy, trauma EXAM: MRI LUMBAR SPINE WITHOUT AND WITH CONTRAST TECHNIQUE: Multiplanar and multiecho pulse sequences of the lumbar spine were obtained without and with intravenous contrast. CONTRAST:  71mL GADAVIST GADOBUTROL 1 MMOL/ML IV SOLN COMPARISON:  Lumbar radiographs from the same day. FINDINGS: Segmentation: Standard segmentation is assumed. The inferior-most fully formed intervertebral disc is labeled L5-S1. Alignment:  Straightening.  No substantial sagittal subluxation. Vertebrae: Linear T1 hypointense fracture plane through the T12 vertebral body with irregularity of the inferior cortex in this region and associated bone marrow edema, compatible with acute/recent fracture. Slight involvement of the right pedicle. Mild height loss. Degenerative/discogenic endplate signal changes at multiple levels. Conus medullaris and cauda equina: Conus extends to the T11-T12. Level. Conus and cauda equina appear normal. Paraspinal and other soft tissues: Unremarkable. Disc levels: T12-L1: Mild facet arthropathy and mild disc bulging. No significant stenosis.  L1-L2: Disc bulge with mild bilateral facet arthropathy. Mild subarticular recess narrowing without significant canal or foraminal stenosis. L2-L3: Broad disc bulge with prominence of the dorsal epidural fat resulting moderate canal stenosis and moderate bilateral foraminal stenosis. L3-L4: Broad disc bulge with bilateral facet arthropathy. Resulting mild to moderate canal stenosis with mild right and moderate left foraminal stenosis. L4-L5: Right eccentric disc bulge with right greater than left facet arthropathy. Resulting moderate to severe right subarticular recess stenosis and right foraminal stenosis. Patent central canal. Mild left foraminal stenosis. L5-S1: Mild disc bulging and facet arthropathy without significant stenosis. IMPRESSION: 1. Findings compatible with acute/recent fracture of the T12 vertebral body with mild height loss, involvement of the endplates, and mild involvement of the right pedicle. No bony retropulsion. 2. At L2-L3, moderate canal and bilateral foraminal stenosis. 3. At L4-L5, moderate to severe right subarticular recess and foraminal stenosis with potential for impingement. 4. At L3-L4, mild to moderate canal stenosis with mild right and moderate left foraminal stenosis Electronically Signed   By: Feliberto Harts M.D.   On: 03/27/2022 13:41   DG Lumbar Spine 2-3 Views  Result Date: 03/27/2022 CLINICAL DATA:  Fall, radiculopathy EXAM: LUMBAR SPINE - 2-3 VIEW COMPARISON:  None Available. FINDINGS: No fracture or dislocation of the lumbar spine. Straightening of the normal lumbar lordosis with severe multilevel disc space height loss and osteophytosis throughout. Moderate to severe facet degenerative change of the lower lumbar levels. Nonobstructive pattern of overlying bowel gas. IMPRESSION: 1. No fracture or dislocation of the lumbar spine. 2. Straightening of the normal lumbar lordosis with severe multilevel disc space height loss and osteophytosis throughout. Electronically  Signed   By: Jearld Lesch M.D.   On: 03/27/2022 11:53    Disposition Plan & Communication  Patient status: Inpatient  Admitted From: ALF Planned disposition location: SNF Anticipated discharge date: 11/15 pending further workup  Family Communication: none    Author: Leeroy Bock, DO [facility] 03/28/2022, 8:14 AM   Available by Epic secure chat 7AM-7PM. If 7PM-7AM, please contact night-coverage.  [facility] contact information found on [email or web address].

## 2022-03-28 NOTE — Plan of Care (Signed)
  Problem: Clinical Measurements: Goal: Will remain free from infection Outcome: Progressing   Problem: Clinical Measurements: Goal: Respiratory complications will improve Outcome: Progressing   Problem: Clinical Measurements: Goal: Cardiovascular complication will be avoided Outcome: Progressing   Problem: Elimination: Goal: Will not experience complications related to bowel motility Outcome: Progressing   Problem: Skin Integrity: Goal: Risk for impaired skin integrity will decrease Outcome: Progressing

## 2022-03-28 NOTE — TOC Progression Note (Signed)
Transition of Care [facility]) - Progression Note    Patient Details  Name: Hector Miller MRN: [id number] Date of Birth: [birth year]  Transition of Care [facility]) CM/SW Contact  Marlowe Sax, RN Phone Number: 03/28/2022, 9:56 AM  Clinical Narrative:     Spoke with the patient  He resides in Independent living and still drives He is agreeable to a bed search Bedsearch sent, will review the bed offers once obtained      Expected Discharge Plan and Services                                                 Social Determinants of Health (SDOH) Interventions    Readmission Risk Interventions    05/16/2021   11:08 AM  Readmission Risk Prevention Plan  Post Dischage Appt Complete  Medication Screening Complete  Transportation Screening Complete

## 2022-03-28 NOTE — Progress Notes (Signed)
PHARMACIST - PHYSICIAN COMMUNICATION  CONCERNING:  Enoxaparin (Lovenox) for DVT Prophylaxis    RECOMMENDATION: Patient was prescribed enoxaprin 0.5 mgkg q24 hours for VTE prophylaxis.   Filed Weights   03/25/22 1157 03/26/22 1051  Weight: 102.2 kg (225 lb 5 oz) 95.3 kg (210 lb)    Body mass index is 28.48 kg/m.  Estimated Creatinine Clearance: 88.2 mL/min (by C-G formula based on SCr of 0.84 mg/dL).   Based on [facility] policy patient is candidate for enoxaparin 0.5mg /kg TBW SQ every 24 hours based on BMI being < 30.  DESCRIPTION: Pharmacy has adjusted enoxaparin dose per [facility] policy.  Patient is now receiving enoxaparin 40 mg every 24 hours    Jaynie Bream, PharmD Clinical Pharmacist  03/28/2022 9:07 AM

## 2022-03-28 NOTE — [place] FL2 (Signed)
[place] MEDICAID FL2 LEVEL OF CARE SCREENING TOOL     IDENTIFICATION  Patient Name: Hector Miller Birthdate: December 03, 1945 Sex: male Admission Date (Current Location): 03/25/2022  [place] and [place] Number:  Chiropodist and Address:  [facility], [address] [place], [place] [address]      Provider Number: [id number]  Attending Physician Name and Address:  Leeroy Bock, MD  Relative Name and Phone Number:  Darel Hong [phone] friend    Current Level of Care: Hospital Recommended Level of Care: Skilled Nursing Facility Prior Approval Number:    Date Approved/Denied:   PASRR Number: [id number]  Discharge Plan: SNF    Current Diagnoses: Patient Active Problem List   Diagnosis Date Noted   Acute urinary retention 03/27/2022   Acute low back pain with sciatica 03/27/2022   Sepsis (HCC) 03/26/2022   Alcohol withdrawal syndrome without complication (HCC) 03/26/2022   Dehydration 03/26/2022   Severe sepsis (HCC) 03/25/2022   Elevated troponin 03/25/2022   Traumatic rhabdomyolysis (HCC) 03/25/2022   AKI (acute kidney injury) (HCC) 03/25/2022   Alcohol abuse 03/25/2022   High anion gap metabolic acidosis 03/25/2022   COVID-19 virus infection 05/14/2021   Coronary artery disease 05/14/2021   Type 2 diabetes mellitus with hyperglycemia (HCC) 05/14/2021   Neuropathy 05/14/2021   Dizziness 05/14/2021   Weakness 05/14/2021   Depression 05/14/2021   Spinal stenosis, lumbar region, with neurogenic claudication 02/25/2021   Lumbar facet arthropathy 02/25/2021   Lumbar radiculopathy 02/25/2021   Chronic pain syndrome 02/25/2021   Gout 12/02/2014   HLD (hyperlipidemia) 12/02/2014   HTN (hypertension) 12/02/2014   Gastroesophageal reflux disease without esophagitis 12/02/2014   BPH (benign prostatic hyperplasia) 12/02/2014   Arthritis of back 12/02/2014   Frequent headaches 12/02/2014    Orientation RESPIRATION BLADDER  Height & Weight     Self, Time, Situation, Place  Normal Continent Weight: 95.3 kg Height:  6' (182.9 cm)  BEHAVIORAL SYMPTOMS/MOOD NEUROLOGICAL BOWEL NUTRITION STATUS      Continent Diet (See DC summary)  AMBULATORY STATUS COMMUNICATION OF NEEDS Skin   Extensive Assist Verbally Normal                       Personal Care Assistance Level of Assistance  Bathing, Feeding, Dressing Bathing Assistance: Limited assistance Feeding assistance: Independent Dressing Assistance: Limited assistance     Functional Limitations Info             SPECIAL CARE FACTORS FREQUENCY  PT (By licensed PT), OT (By licensed OT)     PT Frequency: 5 times per week OT Frequency: 5 times per week            Contractures Contractures Info: Not present    Additional Factors Info  Code Status, Allergies Code Status Info: DNR Allergies Info: Lisinopril           Current Medications (03/28/2022):  This is the current hospital active medication list Current Facility-Administered Medications  Medication Dose Route Frequency Provider Last Rate Last Admin   0.9 % NaCl with KCl 40 mEq / L  infusion   Intravenous Continuous Leeroy Bock, MD 100 mL/hr at 03/28/22 0837 New Bag at 03/28/22 0837   acetaminophen (TYLENOL) tablet 650 mg  650 mg Oral Q6H PRN Agbata, Tochukwu, MD   650 mg at 03/27/22 2129   Or   acetaminophen (TYLENOL) suppository 650 mg  650 mg Rectal Q6H PRN Lucile Shutters, MD  albuterol (PROVENTIL) (2.5 MG/3ML) 0.083% nebulizer solution 2.5 mg  2.5 mg Inhalation Q6H PRN Agbata, Tochukwu, MD       aspirin chewable tablet 81 mg  81 mg Oral Daily Agbata, Tochukwu, MD   81 mg at 03/28/22 0952   cholecalciferol (VITAMIN D3) 25 MCG (1000 UNIT) tablet 1,000 Units  1,000 Units Oral Daily Agbata, Tochukwu, MD   1,000 Units at 03/28/22 0952   enoxaparin (LOVENOX) injection 40 mg  40 mg Subcutaneous Q24H Cordella Register A, RPH       finasteride (PROSCAR) tablet 5 mg  5 mg Oral  Daily Agbata, Tochukwu, MD   5 mg at 03/28/22 2620   folic acid (FOLVITE) tablet 1 mg  1 mg Oral Daily Agbata, Tochukwu, MD   1 mg at 03/28/22 0952   gabapentin (NEURONTIN) capsule 300 mg  300 mg Oral TID Agbata, Tochukwu, MD   300 mg at 03/28/22 0952   HYDROmorphone (DILAUDID) injection 0.5 mg  0.5 mg Intravenous Q4H PRN Jamelle Rushing L, MD   0.5 mg at 03/27/22 1759   insulin aspart (novoLOG) injection 0-15 Units  0-15 Units Subcutaneous TID WC Agbata, Tochukwu, MD   3 Units at 03/28/22 0756   LORazepam (ATIVAN) tablet 0-4 mg  0-4 mg Oral Q8H Agbata, Tochukwu, MD       metoprolol tartrate (LOPRESSOR) tablet 12.5 mg  12.5 mg Oral BID Agbata, Tochukwu, MD   12.5 mg at 03/26/22 2150   mirtazapine (REMERON) tablet 30 mg  30 mg Oral QHS Agbata, Tochukwu, MD   30 mg at 03/27/22 2129   ondansetron (ZOFRAN) tablet 4 mg  4 mg Oral Q6H PRN Agbata, Tochukwu, MD       Or   ondansetron (ZOFRAN) injection 4 mg  4 mg Intravenous Q6H PRN Agbata, Tochukwu, MD       pantoprazole (PROTONIX) EC tablet 40 mg  40 mg Oral Daily Agbata, Tochukwu, MD   40 mg at 03/28/22 0952   polyethylene glycol (MIRALAX / GLYCOLAX) packet 17 g  17 g Oral Daily Agbata, Tochukwu, MD   17 g at 03/28/22 0952   tamsulosin (FLOMAX) capsule 0.8 mg  0.8 mg Oral Daily Jamelle Rushing L, MD   0.8 mg at 03/28/22 3559   thiamine (VITAMIN B1) tablet 100 mg  100 mg Oral Daily Agbata, Tochukwu, MD   100 mg at 03/28/22 7416   traZODone (DESYREL) tablet 100 mg  100 mg Oral QHS PRN Agbata, Tochukwu, MD   100 mg at 03/27/22 0149     Discharge Medications: Please see discharge summary for a list of discharge medications.  Relevant Imaging Results:  Relevant Lab Results:   Additional Information SS #: [id number]  Keilly Fatula Seward Carol, RN

## 2022-03-29 ENCOUNTER — Telehealth: Payer: Self-pay

## 2022-03-29 DIAGNOSIS — F10930 Alcohol use, unspecified with withdrawal, uncomplicated: Secondary | ICD-10-CM | POA: Diagnosis not present

## 2022-03-29 DIAGNOSIS — E8729 Other acidosis: Secondary | ICD-10-CM | POA: Diagnosis not present

## 2022-03-29 DIAGNOSIS — S32000A Wedge compression fracture of unspecified lumbar vertebra, initial encounter for closed fracture: Secondary | ICD-10-CM

## 2022-03-29 DIAGNOSIS — E860 Dehydration: Secondary | ICD-10-CM | POA: Diagnosis not present

## 2022-03-29 DIAGNOSIS — R339 Retention of urine, unspecified: Secondary | ICD-10-CM

## 2022-03-29 LAB — CBC
HCT: 36.1 % — ABNORMAL LOW (ref 39.0–52.0)
Hemoglobin: 12.2 g/dL — ABNORMAL LOW (ref 13.0–17.0)
MCH: 34.4 pg — ABNORMAL HIGH (ref 26.0–34.0)
MCHC: 33.8 g/dL (ref 30.0–36.0)
MCV: 101.7 fL — ABNORMAL HIGH (ref 80.0–100.0)
Platelets: 92 10*3/uL — ABNORMAL LOW (ref 150–400)
RBC: 3.55 MIL/uL — ABNORMAL LOW (ref 4.22–5.81)
RDW: 13.1 % (ref 11.5–15.5)
WBC: 5.9 10*3/uL (ref 4.0–10.5)
nRBC: 0 % (ref 0.0–0.2)

## 2022-03-29 LAB — COMPREHENSIVE METABOLIC PANEL
ALT: 44 U/L (ref 0–44)
AST: 77 U/L — ABNORMAL HIGH (ref 15–41)
Albumin: 2.5 g/dL — ABNORMAL LOW (ref 3.5–5.0)
Alkaline Phosphatase: 61 U/L (ref 38–126)
Anion gap: 8 (ref 5–15)
BUN: 10 mg/dL (ref 8–23)
CO2: 21 mmol/L — ABNORMAL LOW (ref 22–32)
Calcium: 8.1 mg/dL — ABNORMAL LOW (ref 8.9–10.3)
Chloride: 108 mmol/L (ref 98–111)
Creatinine, Ser: 0.73 mg/dL (ref 0.61–1.24)
GFR, Estimated: >60 mL/min (ref >60)
Glucose, Bld: 172 mg/dL — ABNORMAL HIGH (ref 70–99)
Potassium: 4.1 mmol/L (ref 3.5–5.1)
Sodium: 137 mmol/L (ref 135–145)
Total Bilirubin: 0.7 mg/dL (ref 0.3–1.2)
Total Protein: 5.5 g/dL — ABNORMAL LOW (ref 6.5–8.1)

## 2022-03-29 LAB — CK: Total CK: 917 U/L — ABNORMAL HIGH (ref 49–397)

## 2022-03-29 LAB — GLUCOSE, CAPILLARY
Glucose-Capillary: 197 mg/dL — ABNORMAL HIGH (ref 70–99)
Glucose-Capillary: 162 mg/dL — ABNORMAL HIGH (ref 70–99)
Glucose-Capillary: 177 mg/dL — ABNORMAL HIGH (ref 70–99)
Glucose-Capillary: 221 mg/dL — ABNORMAL HIGH (ref 70–99)

## 2022-03-29 MED ORDER — CHLORHEXIDINE GLUCONATE CLOTH 2 % EX PADS
6.0000 | MEDICATED_PAD | Freq: Every day | CUTANEOUS | Status: DC
  Administered 2022-03-29 – 2022-04-01 (×3): 6 via TOPICAL

## 2022-03-29 MED ORDER — HYDROMORPHONE HCL 1 MG/ML IJ SOLN: 0.5000 mg | INTRAMUSCULAR | Status: DC | PRN

## 2022-03-29 MED ORDER — OXYCODONE HCL 5 MG PO TABS
5.0000 mg | ORAL_TABLET | ORAL | Status: DC | PRN
  Administered 2022-03-29 – 2022-03-30 (×2): 5 mg via ORAL
  Administered 2022-03-31: 10 mg via ORAL
  Administered 2022-03-31: 5 mg via ORAL
  Administered 2022-04-01: 10 mg via ORAL
  Filled 2022-03-29: qty 2
  Filled 2022-03-29 (×2): qty 1
  Filled 2022-03-29: qty 2
  Filled 2022-03-29: qty 1

## 2022-03-29 MED ORDER — NAPHAZOLINE-GLYCERIN 0.012-0.25 % OP SOLN
1.0000 [drp] | Freq: Four times a day (QID) | OPHTHALMIC | Status: DC | PRN
  Administered 2022-03-29 – 2022-04-01 (×5): 2 [drp] via OPHTHALMIC
  Filled 2022-03-29: qty 15

## 2022-03-29 NOTE — Plan of Care (Signed)

## 2022-03-29 NOTE — Progress Notes (Signed)
    Attending Progress Note  History: Emerick Weatherly is here for sepsis, compression fracture, urinary retention.  He continues to have back pain but was able to stand today.    Physical Exam: Vitals:   03/29/22 0800 03/29/22 1600  BP: (!) 119/92 105/70  Pulse: 76 79  Resp: 17 17  Temp: 99 F (37.2 C) 97.8 F (36.6 C)  SpO2: 96% 95%    AA Ox3 CNI MAEW  Data:  Other tests/results: CT reviewed  Assessment/Plan:  Joandy Burget is here for L1 compression fracture.  He has chronic lower back issues as well.  - ok to work with PTOT - brace on when OOB - Will follow up as outpatient   Venetia Night MD, [facility] Department of Neurosurgery

## 2022-03-29 NOTE — Progress Notes (Signed)
PROGRESS NOTE  Mathew Brustad    DOB: 27-Feb-1946, 76 y.o.  QN:[id number]    Code Status: DNR   DOA: 03/25/2022   LOS: 4   Brief hospital course  Theodoros Henige is a 76 y.o. male with a PMH significant for coronary artery disease, diabetes mellitus, hypertension, dyslipidemia, alcohol dependence.  [facility] to the ED on 03/25/2022 with fall out of bed and unable to stand from ground. He was on the floor overnight.   Denies any precipitating symptoms, CP, SOB, LOC, or major injuries from the fall. He simply just wasn't able to stand up. Has had worsening lower back pain for months with acute worsening recently.   In the ED, it was found that they had tachycardia to 124, tachypnea to 22, and was normotensive and stable ORA. Afebrile. Significant findings included troponin of 101, serum glucose of 325, bicarbonate level of 11 with an anion gap of 25, lactic acid 8.9, white count of 13.6. urinalysis glucose >500, hgb large but negative for RBC (likely from elevated CK in prolonged time down), not indicative of infection.  Chest x-ray shows no acute findings. Head and neck CT were negative for acute changes.   They were initially treated for severe sepsis with unknown source with 3 L of IV fluids in the ER as well as a dose of cefepime, Flagyl and vancomycin. He also received 10 units of NovoLog.   Patient was admitted to medicine service for further workup and management of severe sepsis from unknown source as outlined in detail below.  11/12- without a clear source of infection and lacking typical signs of infection, discontinued the sepsis course started on presentation. Patient's main complaint is his lower back pain and states he can hardly move or feel his legs. Negative for bowel or bladder incontinence. He is having urine retention.  T12/L1? Fracture (see CT results). Neurosurgery consulted  03/29/22 - dispo pending neurosurgery plan. PT/OT  evaluation and SNF placement  Assessment & Plan  Principal Problem:   Severe sepsis ([place]) Active Problems:   Type 2 diabetes mellitus with hyperglycemia (HCC)   AKI (acute kidney injury) (HCC)   Elevated troponin   Alcohol abuse   Depression   HTN (hypertension)   Gastroesophageal reflux disease without esophagitis   Coronary artery disease   Traumatic rhabdomyolysis (HCC)   High anion gap metabolic acidosis   Sepsis (HCC)   Alcohol withdrawal syndrome without complication (HCC)   Dehydration   Acute urinary retention   Acute low back pain with sciatica   Fx lumbar vertebra-closed ([place])   Radiculopathy  Severe sepsis ([place]) criteria on presentation. Generalized weakness- Sepsis criteria have resolved. No known source on chest xray, skin, or urinalysis. Head CT negative for acute abnormalities. He is alert and oriented to baseline so do not suspect CNS infection. Has normal WBC and normal procalcitonin. Suspect multifactorial in nature for initial presentation of fall and weakness including likely viral infection, acidosis in relation to DM, and possibly intoxication. CK elevated on arrival.  Am cortisol level normal. CK levels increased while admitted and patient was on statin medication which was discontinued due to possible statin induced myositis. - electrolyte monitoring and replacement PRN - discontinued IV Abx with neg cultures and no systemic signs of infection.  - PT/OT - discontinued statin therapy  Lower back pain- acute on chronic. Patient describes more to me the acute nature in which his function declined prior to admission which makes me  have concerns for possible cord compression, severe radiculopathy. Unable to see past imaging of spine in chart or care everywhere. Weakness from hip flexors and distal. Describes sensation loss in lower extremities worse on left. Normal to decreased reflexes bilaterally. Now proved to have urinary retention again which is new for him and  further causes alarm. Lumbar xray negative for fracture but shows severe multilevel disc space height loss. Lumbar MRI shows significant degenerative changes in lumbar spine including stenosis of canal and foramen and fracture of T12. CT thoracic spine to further evaluate the fracture shows that patient actually has extra vertebrae T12/L1 and technically 6 lumbar.  - Analgesia PRN - heating pad - tylenol PRN - voltaren gel - encourage OOB - PT/OT - neurosurgery following, appreciate recs  Acute urinary retention-Foley placed. Good UOP. Per neurosurgery, not related to the changes seen on spine images - continue flomax - continue foley - strict I/O - urology consulted, Dr. Bernardo Heater   Type 2 diabetes mellitus with hyperglycemia ([place])- A1c 6.8. glucose >500 in urinalysis. Positive anion gap on presentation now closed. Non-insulin dependent at baseline. Glycemic control with sliding scale insulin Maintain consistent carbohydrate diet   AKI- prerenal. Resolved to normal baseline with hydration.   Elevated troponin  CAD- denies chest pain. ECG shows NSR with dropped beats Demand mismatch. Troponins trended flat. Denied any LOC so arrhythmia less likely contributing to fall out of bed. Continue Crestor and place patient on low-dose aspirin - continue low-dose beta-blockers   Alcohol dependence- denies h/o withdrawals. Denies current tremors - continue thiamine/folic acid - continue CIWA protocol    Depression Continue Remeron and Lexapro   Traumatic rhabdomyolysis (HCC)-improving Patient is status post fall with elevated CK levels - monitor CK levels - IV fluids   Gastroesophageal reflux disease without esophagitis Continue PPI   Body mass index is 28.48 kg/m.  VTE ppx: enoxaparin (LOVENOX) injection 40 mg Start: 03/28/22 2200 lovenox  Diet:     Diet   Diet Carb Modified Fluid consistency: Thin; Room service appropriate? Yes   Consultants: Neurosurgery    Subjective  03/29/22    Pt reports lower back pain stable to improved. Complains of being bored. Agreeable to going to SNF. All questions and concerns were addressed at time of encounter.    Objective   Vitals:   03/28/22 2000 03/28/22 2226 03/28/22 2320 03/29/22 0412  BP: 109/70 109/70 112/71 120/75  Pulse: 90 90 85 88  Resp:  18 18   Temp:  98.2 F (36.8 C) 98.1 F (36.7 C)   TempSrc:      SpO2:  93% 94%   Weight:      Height:        Intake/Output Summary (Last 24 hours) at 03/29/2022 0749 Last data filed at 03/29/2022 0503 Gross per 24 hour  Intake 1955.76 ml  Output 900 ml  Net 1055.76 ml    Filed Weights   03/25/22 1157 03/26/22 1051  Weight: 102.2 kg 95.3 kg     Physical Exam:  General: awake, alert, NAD HEENT: atraumatic, clear conjunctiva, anicteric sclera, MMM, hearing grossly normal Respiratory: normal respiratory effort. Cardiovascular: quick capillary refill Nervous: A&O x3. normal speech Decreased strength bilateral lower extremities, right worse than left Reduced to normal reflexes. Sensation decreased worse on right Extremities:no edema, normal tone Skin: dry, intact, normal temperature, normal color. No rashes, lesions or ulcers on exposed skin Psychiatry: normal mood, congruent affect  Labs   I have personally reviewed the following labs and imaging  studies CBC    Component Value Date/Time   WBC 5.9 03/29/2022 0531   RBC 3.55 (L) 03/29/2022 0531   HGB 12.2 (L) 03/29/2022 0531   HCT 36.1 (L) 03/29/2022 0531   PLT 92 (L) 03/29/2022 0531   MCV 101.7 (H) 03/29/2022 0531   MCH 34.4 (H) 03/29/2022 0531   MCHC 33.8 03/29/2022 0531   RDW 13.1 03/29/2022 0531   LYMPHSABS 0.9 03/26/2022 0644   MONOABS 0.7 03/26/2022 0644   EOSABS 0.1 03/26/2022 0644   BASOSABS 0.1 03/26/2022 0644      Latest Ref Rng & Units 03/29/2022    5:31 AM 03/28/2022    5:18 AM 03/27/2022    5:07 AM  BMP  Glucose 70 - 99 mg/dL 875  643  329   BUN 8 - 23 mg/dL 10  10  10     Creatinine 0.61 - 1.24 mg/dL  5.18  8.41   Sodium 135 - 145 mmol/L 137  137  134   Potassium 3.5 - 5.1 mmol/L 4.1  3.4  3.4   Chloride 98 - 111 mmol/L 108  105  108   CO2 22 - 32 mmol/L 21  24  20    Calcium 8.9 - 10.3 mg/dL 8.1  8.5  7.6     CT THORACIC SPINE WO CONTRAST  Result Date: 03/27/2022 CLINICAL DATA:  Fall 1 week ago EXAM: CT THORACIC SPINE WITHOUT CONTRAST TECHNIQUE: Multidetector CT images of the thoracic were obtained using the standard protocol without intravenous contrast. RADIATION DOSE REDUCTION: This exam was performed according to the departmental dose-optimization program which includes automated exposure control, adjustment of the mA and/or kV according to patient size and/or use of iterative reconstruction technique. COMPARISON:  No prior CT thoracic spine, correlation is made with MRI lumbar spine 04/04/2022 FINDINGS: Alignment: No listhesis. Preservation of the normal thoracic kyphosis. Vertebrae: Please note there is transitional anatomy. Counting from the skull base on the 03/25/2022 CT cervical spine and 12 rib-bearing vertebral bodies on this study. The previously labeled T12 vertebral body is actually L1, and it consists of a miniature rib on the right and a lumbar-type transverse process on the left. This means that there are 5 additional lumbar-type vertebral bodies, the most inferior of which to be designated L6. Redemonstrated acute fracture of L1, as seen on the same-day MRI lumbar spine. There is approximately 20% vertebral body height loss at present. No retropulsion. The fracture does involve the posterior cortex of T12, but does not appear to involve the pedicles or posterior elements, although abnormal signal was seen extending into the right pedicle on the MRI. No additional acute fracture in the thoracic spine. Paraspinal and other soft tissues: Aortic atherosclerosis. Trace bilateral pleural effusions with associated atelectasis. Disc levels: No significant  spinal canal stenosis or neural foraminal narrowing. IMPRESSION: 1. Please note there is transitional anatomy, with the previously labeled T12 vertebral body actually L1 when counting from the skull base. L1 has a miniature rib on the right and a lumbar-type transverse process on the left. This means that there are 5 additional lumbar-type vertebral bodies, the most inferior of which could be designated L6. Please correlate with imaging if any intervention is planned. 2. Redemonstrated acute fracture of L1, as seen on the same-day MRI lumbar spine. There is approximately 20% vertebral body height loss at present. No retropulsion. The fracture does involve the posterior cortex of T12, but does not appear to involve the pedicles or posterior elements, although abnormal signal was  seen extending into the right pedicle on the MRI. 3. No additional acute fracture in the thoracic spine. 4. Aortic atherosclerosis. Aortic Atherosclerosis (ICD10-I70.0). Electronically Signed   By: Merilyn Baba M.D.   On: 03/27/2022 20:04   MR Lumbar Spine W Wo Contrast  Result Date: 03/27/2022 CLINICAL DATA:  Lumbar radiculopathy, trauma EXAM: MRI LUMBAR SPINE WITHOUT AND WITH CONTRAST TECHNIQUE: Multiplanar and multiecho pulse sequences of the lumbar spine were obtained without and with intravenous contrast. CONTRAST:  48mL GADAVIST GADOBUTROL 1 MMOL/ML IV SOLN COMPARISON:  Lumbar radiographs from the same day. FINDINGS: Segmentation: Standard segmentation is assumed. The inferior-most fully formed intervertebral disc is labeled L5-S1. Alignment:  Straightening.  No substantial sagittal subluxation. Vertebrae: Linear T1 hypointense fracture plane through the T12 vertebral body with irregularity of the inferior cortex in this region and associated bone marrow edema, compatible with acute/recent fracture. Slight involvement of the right pedicle. Mild height loss. Degenerative/discogenic endplate signal changes at multiple levels. Conus  medullaris and cauda equina: Conus extends to the T11-T12. Level. Conus and cauda equina appear normal. Paraspinal and other soft tissues: Unremarkable. Disc levels: T12-L1: Mild facet arthropathy and mild disc bulging. No significant stenosis. L1-L2: Disc bulge with mild bilateral facet arthropathy. Mild subarticular recess narrowing without significant canal or foraminal stenosis. L2-L3: Broad disc bulge with prominence of the dorsal epidural fat resulting moderate canal stenosis and moderate bilateral foraminal stenosis. L3-L4: Broad disc bulge with bilateral facet arthropathy. Resulting mild to moderate canal stenosis with mild right and moderate left foraminal stenosis. L4-L5: Right eccentric disc bulge with right greater than left facet arthropathy. Resulting moderate to severe right subarticular recess stenosis and right foraminal stenosis. Patent central canal. Mild left foraminal stenosis. L5-S1: Mild disc bulging and facet arthropathy without significant stenosis. IMPRESSION: 1. Findings compatible with acute/recent fracture of the T12 vertebral body with mild height loss, involvement of the endplates, and mild involvement of the right pedicle. No bony retropulsion. 2. At L2-L3, moderate canal and bilateral foraminal stenosis. 3. At L4-L5, moderate to severe right subarticular recess and foraminal stenosis with potential for impingement. 4. At L3-L4, mild to moderate canal stenosis with mild right and moderate left foraminal stenosis Electronically Signed   By: Margaretha Sheffield M.D.   On: 03/27/2022 13:41   DG Lumbar Spine 2-3 Views  Result Date: 03/27/2022 CLINICAL DATA:  Fall, radiculopathy EXAM: LUMBAR SPINE - 2-3 VIEW COMPARISON:  None Available. FINDINGS: No fracture or dislocation of the lumbar spine. Straightening of the normal lumbar lordosis with severe multilevel disc space height loss and osteophytosis throughout. Moderate to severe facet degenerative change of the lower lumbar levels.  Nonobstructive pattern of overlying bowel gas. IMPRESSION: 1. No fracture or dislocation of the lumbar spine. 2. Straightening of the normal lumbar lordosis with severe multilevel disc space height loss and osteophytosis throughout. Electronically Signed   By: Delanna Ahmadi M.D.   On: 03/27/2022 11:53    Disposition Plan & Communication  Patient status: Inpatient  Admitted From: ALF Planned disposition location: SNF Anticipated discharge date: 11/15 pending SNF placement  Family Communication: none    Author: Richarda Osmond, DO [facility] 03/29/2022, 7:49 AM   Available by Epic secure chat 7AM-7PM. If 7PM-7AM, please contact night-coverage.  [facility] contact information found on [email or web address].

## 2022-03-29 NOTE — Progress Notes (Signed)
Physical Therapy Treatment Patient Details Name: Hector Miller MRN: [id number] DOB: May 19, 1945 Today's Date: 03/29/2022   History of Present Illness Pt admitted to [facility] on 03/25/22 for syncopal event and mechanical fall; unable to stand from the ground and was on the floor all night. Found to be hyperglycemic, tachycardic, and tachypneic, upon EMS arrival. Dx with severe sepsis. Elevated troponin levels secondary to demand ischemia from tachycardia. Significant PMH includes: CAD s/p CABG, DM, HTN, HLD, and alcohol abuse. Imaging negative for acute abnormalities.    PT Comments    Pt is making gradual progress towards goals. LSO delivered to bedside and donned in seated position. Pt reports minimal benefit from brace and still rates pain at 10/10 in R lower back with mobility. Pt premedicated and coordinated with RN staff. Able to stand from significantly elevated bed height, however unable to ambulate at this time. Heavy post leaning once standing. Able to perform 2 standing reps. Will continue to progress as able.   Recommendations for follow up therapy are one component of a multi-disciplinary discharge planning process, led by the attending physician.  Recommendations may be updated based on patient status, additional functional criteria and insurance authorization.  Follow Up Recommendations  Skilled nursing-short term rehab (<3 hours/day) Can patient physically be transported by private vehicle: No   Assistance Recommended at Discharge Frequent or constant Supervision/Assistance  Patient can return home with the following Two people to help with walking and/or transfers;A lot of help with bathing/dressing/bathroom;Assistance with cooking/housework;Direct supervision/assist for medications management;Assist for transportation;Help with stairs or ramp for entrance   Equipment Recommendations   (TBD)    Recommendations for Other Services       Precautions / Restrictions  Precautions Precautions: Fall Restrictions Weight Bearing Restrictions: No     Mobility  Bed Mobility Overal bed mobility: Needs Assistance Bed Mobility: Supine to Sit, Sit to Supine     Supine to sit: Mod assist Sit to supine: Mod assist   General bed mobility comments: cues for log rolling. Needs assist for scooting out towards EOB. Once back in bed, needs bed functions to assist in sliding up in bed    Transfers Overall transfer level: Needs assistance Equipment used: Rolling walker (2 wheels) Transfers: Sit to/from Stand Sit to Stand: Mod assist, From elevated surface           General transfer comment: In seated position, LSO donned and bed significantly elevated to assist in standing. Heavy post leaning and B LE bracing against bed to attain upright posture. 2 reps performed with ability to stand ~30 seconds. Pain limited    Ambulation/Gait             Pre-gait activities: weight shift and marching in place, however pt unable to truly ambulate     Stairs             Wheelchair Mobility    Modified Rankin (Stroke Patients Only)       Balance Overall balance assessment: Needs assistance Sitting-balance support: No upper extremity supported, Feet supported Sitting balance-Leahy Scale: Fair   Postural control: Posterior lean Standing balance support: Bilateral upper extremity supported, Reliant on assistive device for balance, During functional activity Standing balance-Leahy Scale: Poor                              Cognition Arousal/Alertness: Awake/alert Behavior During Therapy: WFL for tasks assessed/performed Overall Cognitive Status: Within Functional Limits for tasks assessed  Exercises Other Exercises Other Exercises: educated on supine ther-ex to assist with preventing muscle atrophy.    General Comments        Pertinent Vitals/Pain Pain Assessment Pain  Assessment: 0-10 Pain Score: 10-Worst pain ever Pain Location: R side low back Pain Descriptors / Indicators: Aching, Dull, Sharp, Restless Pain Intervention(s): Limited activity within patient's tolerance, Premedicated before session    Home Living                          Prior Function            PT Goals (current goals can now be found in the care plan section) Acute Rehab PT Goals Patient Stated Goal: this isn't quality of life lying in the bed PT Goal Formulation: With patient Time For Goal Achievement: 04/10/22 Potential to Achieve Goals: Fair Progress towards PT goals: Progressing toward goals    Frequency    Min 2X/week      PT Plan Current plan remains appropriate    Co-evaluation              AM-PAC PT "6 Clicks" Mobility   Outcome Measure  Help needed turning from your back to your side while in a flat bed without using bedrails?: A Lot Help needed moving from lying on your back to sitting on the side of a flat bed without using bedrails?: A Lot Help needed moving to and from a bed to a chair (including a wheelchair)?: A Lot Help needed standing up from a chair using your arms (e.g., wheelchair or bedside chair)?: A Lot Help needed to walk in hospital room?: Total Help needed climbing 3-5 steps with a railing? : Total 6 Click Score: 10    End of Session   Activity Tolerance: Patient limited by pain Patient left: in bed;with bed alarm set Nurse Communication: Mobility status PT Visit Diagnosis: Unsteadiness on feet (R26.81);History of falling (Z91.81);Difficulty in walking, not elsewhere classified (R26.2);Muscle weakness (generalized) (M62.81);Pain Pain - Right/Left:  (back)     Time: 2751-7001 PT Time Calculation (min) (ACUTE ONLY): 23 min  Charges:  $Therapeutic Activity: 23-37 mins                     Elizabeth Palau, PT, DPT, GCS [phone]    Hector Miller 03/29/2022, 2:44 PM

## 2022-03-29 NOTE — Consult Note (Signed)
Urology Consult  Requesting physician: Doristine Mango, MD  Reason for consultation: Urinary retention  Chief Complaint: N/A  History of Present Illness: Hector Miller is a 76 y.o. who presented to the ED 03/25/2022 after falling out of bed in [facility] facility.  He stayed on the floor overnight and was transported to the ED once discovered by staff.  In the ED he was tachycardic and tachypneic.  Initially treated for severe sepsis without a definite source identified  Was noted 11/12 to have urinary retention.  Complains of weakness and inability to walk.  Has been evaluated by neurosurgery and was noted to have spinal stenosis though this was not felt to be contributing to his urinary retention  Foley catheter was removed and unable to void.  The catheter was replaced.  Denies previous history of voiding problems or urinary retention.  Prior history of stone disease in the remote past.  Prior to replacement of his Foley catheter he states he felt no urge to void or sensation of bladder fullness.  He has been started on tamsulosin and finasteride.  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   finasteride (PROSCAR) 5 MG tablet Take 5 mg by mouth daily.   folic acid (FOLVITE) 1 MG tablet Take 1 mg by mouth daily.   furosemide (LASIX) 20 MG tablet Take 20 mg by mouth.   gabapentin (NEURONTIN) 300 MG capsule Take 300 mg by mouth 3 (three)  times daily.   metFORMIN (GLUCOPHAGE) 500 MG tablet Take 1,000 mg by mouth 2 (two) times daily with a meal.   mirtazapine (REMERON) 30 MG tablet Take 30 mg by mouth at bedtime.   pantoprazole (PROTONIX) 40 MG tablet Take 40 mg by mouth daily.   polyethylene glycol (MIRALAX / GLYCOLAX) 17 g packet Take 17 g by mouth daily.   rosuvastatin (CRESTOR) 20 MG tablet Take 20 mg by mouth at bedtime.   thiamine (VITAMIN B-1) 100 MG tablet Take 100 mg by mouth daily.    Allergies:  Allergies  Allergen Reactions   Lisinopril Cough    Family History  Problem Relation Age of Onset   Cancer Mother        Breast   Parkinson's disease Mother    Heart failure Father    Cancer Father        Stomach   Stroke Father    Hypertension Father     Social History:  reports that he quit smoking about 52 years ago. His smoking use included cigarettes, cigars, and pipe. He has quit using smokeless tobacco. He reports current alcohol use. He reports that he does not use drugs.  ROS: Otherwise noncontributory septa as per the HPI  Physical Exam:  Vital signs in last 24 hours: Temp:  [97.8 F (36.6 C)-99 F (37.2 C)] 97.8  F (36.6 C) (11/14 1600) Pulse Rate:  [76-90] 79 (11/14 1600) Resp:  [17-18] 17 (11/14 1600) BP: (105-120)/(67-92) 105/70 (11/14 1600) SpO2:  [93 %-96 %] 95 % (11/14 1600) Constitutional:  Alert, No acute distress HEENT: [place] AT Respiratory: Normal respiratory effort, lungs clear bilaterally GI: Abdomen is soft, nontender, nondistended, no abdominal masses   Laboratory Data:  Recent Labs    03/27/22 0507 03/28/22 0518 03/29/22 0531  WBC 6.0 6.1 5.9  HGB 11.7* 12.7* 12.2*  HCT 33.7* 36.7* 36.1*   Recent Labs    03/27/22 0507 03/28/22 0518 03/29/22 0531  NA 134* 137 137  K 3.4* 3.4* 4.1  CL 108 105 108  CO2 20* 24 21*  GLUCOSE 140* 186* 172*  BUN 10 10 10   CREATININE 0.80 0.84 0.73  CALCIUM 7.6* 8.5* 8.1*   No results for input(s): "LABPT", "INR" in the last 72  hours. No results for input(s): "LABURIN" in the last 72 hours. Results for orders placed or performed during the hospital encounter of 03/25/22  Blood culture (routine x 2)     Status: None (Preliminary result)   Collection Time: 03/25/22  8:34 AM   Specimen: Right Antecubital; Blood  Result Value Ref Range Status   Specimen Description RIGHT ANTECUBITAL  Final   Special Requests   Final    BOTTLES DRAWN AEROBIC AND ANAEROBIC Blood Culture results may not be optimal due to an inadequate volume of blood received in culture bottles   Culture   Final    NO GROWTH 4 DAYS Performed at [facility], [address] [place], [place] [address]    Report Status PENDING  Incomplete  Resp Panel by RT-PCR (Flu A&B, Covid) Anterior Nasal Swab     Status: None   Collection Time: 03/25/22  8:35 AM   Specimen: Anterior Nasal Swab  Result Value Ref Range Status   SARS Coronavirus 2 by RT PCR NEGATIVE NEGATIVE Final    Comment: (NOTE) SARS-CoV-2 target nucleic acids are NOT DETECTED.  The SARS-CoV-2 RNA is generally detectable in upper respiratory specimens during the acute phase of infection. The lowest concentration of SARS-CoV-2 viral copies this assay can detect is 138 copies/mL. A negative result does not preclude SARS-Cov-2 infection and should not be used as the sole basis for treatment or other patient management decisions. A negative result may occur with  improper specimen collection/handling, submission of specimen other than nasopharyngeal swab, presence of viral mutation(s) within the areas targeted by this assay, and inadequate number of viral copies(<138 copies/mL). A negative result must be combined with clinical observations, patient history, and epidemiological information. The expected result is Negative.  Fact Sheet for Patients:  EntrepreneurPulse.com.au  Fact Sheet for Healthcare Providers:  IncredibleEmployment.be  This test  is no t yet approved or cleared by the [place] FDA and  has been authorized for detection and/or diagnosis of SARS-CoV-2 by FDA under an Emergency Use Authorization (EUA). This EUA will remain  in effect (meaning this test can be used) for the duration of the COVID-19 declaration under Section 564(b)(1) of the Act, 21 U.S.C.section 360bbb-3(b)(1), unless the authorization is terminated  or revoked sooner.       Influenza A by PCR NEGATIVE NEGATIVE Final   Influenza B by PCR NEGATIVE NEGATIVE Final    Comment: (NOTE) The Xpert Xpress SARS-CoV-2/FLU/RSV plus assay is intended as an aid in the diagnosis of influenza from Nasopharyngeal swab specimens and should not be used as a sole basis for treatment.  Nasal washings and aspirates are unacceptable for Xpert Xpress SARS-CoV-2/FLU/RSV testing.  Fact Sheet for Patients: BloggerCourse.com  Fact Sheet for Healthcare Providers: SeriousBroker.it  This test is not yet approved or cleared by the [place] FDA and has been authorized for detection and/or diagnosis of SARS-CoV-2 by FDA under an Emergency Use Authorization (EUA). This EUA will remain in effect (meaning this test can be used) for the duration of the COVID-19 declaration under Section 564(b)(1) of the Act, 21 U.S.C. section 360bbb-3(b)(1), unless the authorization is terminated or revoked.  Performed at [facility], [address] [place], [place] [address]   Blood culture (routine x 2)     Status: None (Preliminary result)   Collection Time: 03/25/22  8:47 AM   Specimen: Right Antecubital; Blood  Result Value Ref Range Status   Specimen Description RIGHT ANTECUBITAL  Final   Special Requests   Final    BOTTLES DRAWN AEROBIC AND ANAEROBIC Blood Culture adequate volume   Culture   Final    NO GROWTH 4 DAYS Performed at [facility], [address] [place], [place] [address]    Report  Status PENDING  Incomplete     Radiologic Imaging: CT THORACIC SPINE WO CONTRAST  Result Date: 03/27/2022 CLINICAL DATA:  Fall 1 week ago EXAM: CT THORACIC SPINE WITHOUT CONTRAST TECHNIQUE: Multidetector CT images of the thoracic were obtained using the standard protocol without intravenous contrast. RADIATION DOSE REDUCTION: This exam was performed according to the departmental dose-optimization program which includes automated exposure control, adjustment of the mA and/or kV according to patient size and/or use of iterative reconstruction technique. COMPARISON:  No prior CT thoracic spine, correlation is made with MRI lumbar spine 04/04/2022 FINDINGS: Alignment: No listhesis. Preservation of the normal thoracic kyphosis. Vertebrae: Please note there is transitional anatomy. Counting from the skull base on the 03/25/2022 CT cervical spine and 12 rib-bearing vertebral bodies on this study. The previously labeled T12 vertebral body is actually L1, and it consists of a miniature rib on the right and a lumbar-type transverse process on the left. This means that there are 5 additional lumbar-type vertebral bodies, the most inferior of which to be designated L6. Redemonstrated acute fracture of L1, as seen on the same-day MRI lumbar spine. There is approximately 20% vertebral body height loss at present. No retropulsion. The fracture does involve the posterior cortex of T12, but does not appear to involve the pedicles or posterior elements, although abnormal signal was seen extending into the right pedicle on the MRI. No additional acute fracture in the thoracic spine. Paraspinal and other soft tissues: Aortic atherosclerosis. Trace bilateral pleural effusions with associated atelectasis. Disc levels: No significant spinal canal stenosis or neural foraminal narrowing. IMPRESSION: 1. Please note there is transitional anatomy, with the previously labeled T12 vertebral body actually L1 when counting from the skull  base. L1 has a miniature rib on the right and a lumbar-type transverse process on the left. This means that there are 5 additional lumbar-type vertebral bodies, the most inferior of which could be designated L6. Please correlate with imaging if any intervention is planned. 2. Redemonstrated acute fracture of L1, as seen on the same-day MRI lumbar spine. There is approximately 20% vertebral body height loss at present. No retropulsion. The fracture does involve the posterior cortex of T12, but does not appear to involve the pedicles or posterior elements, although abnormal signal was seen extending into the right pedicle on the MRI. 3. No additional acute fracture in  the thoracic spine. 4. Aortic atherosclerosis. Aortic Atherosclerosis (ICD10-I70.0). Electronically Signed   By: Merilyn Baba M.D.   On: 03/27/2022 20:04    Impression/Assessment:  1.  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  No recent pelvic imaging prior to admission and his retention could be chronic  Recommendation:  Indwelling Foley catheter x7-10 days Office follow-up for catheter removal/voiding trial If he has persistent retention and no sensation of bladder fullness will schedule urodynamic study   03/29/2022, 4:00 PM  John Giovanni,  MD

## 2022-03-29 NOTE — Progress Notes (Signed)
Occupational Therapy Treatment Patient Details Name: Hector Miller MRN: [id number] DOB: 1945-06-02 Today's Date: 03/29/2022   History of present illness Pt admitted to [facility] on 03/25/22 for syncopal event and mechanical fall; unable to stand from the ground and was on the floor all night. Found to be hyperglycemic, tachycardic, and tachypneic, upon EMS arrival. Dx with severe sepsis. Elevated troponin levels secondary to demand ischemia from tachycardia. Significant PMH includes: CAD s/p CABG, DM, HTN, HLD, and alcohol abuse. Imaging negative for acute abnormalities.   OT comments  Patient in supine upon arrival, requesting to reposition self in bed, and agreeable to OT tx. Supine>sit mod A for trunk mobility. Sit<> stand x2 reps with mod A +2 and manual facilitation to stand upright due to posterior lean. Patient with c/o back pain 10/10, requesting pain meds, nurse notified. Patient was able to take 3 side steps to [facility] with min A. Patient placed back in supine with max A. Patient left in bed with call bell in reach, bed alarm set, and all needs met.    Recommendations for follow up therapy are one component of a multi-disciplinary discharge planning process, led by the attending physician.  Recommendations may be updated based on patient status, additional functional criteria and insurance authorization.    Follow Up Recommendations  Skilled nursing-short term rehab (<3 hours/day)     Assistance Recommended at Discharge Frequent or constant Supervision/Assistance  Patient can return home with the following  A lot of help with walking and/or transfers;A little help with bathing/dressing/bathroom;Help with stairs or ramp for entrance;Assist for transportation;Assistance with cooking/housework   Equipment Recommendations  Other (comment) (Defer to next venur of care)       Precautions / Restrictions Precautions Precautions: Fall Restrictions Weight Bearing Restrictions: No        Mobility Bed Mobility Overal bed mobility: Needs Assistance Bed Mobility: Supine to Sit, Sit to Supine     Supine to sit: Mod assist Sit to supine: Max assist        Transfers Overall transfer level: Needs assistance     Sit to Stand: Mod assist, +2 safety/equipment           General transfer comment: 2x STS attempts, able to acheive fully upright standing position     Balance Overall balance assessment: Needs assistance Sitting-balance support: No upper extremity supported, Feet supported Sitting balance-Leahy Scale: Fair   Postural control: Posterior lean Standing balance support: Bilateral upper extremity supported, Reliant on assistive device for balance, During functional activity Standing balance-Leahy Scale: Poor Standing balance comment: mod A in standing with RW                           ADL either performed or assessed with clinical judgement    Extremity/Trunk Assessment Upper Extremity Assessment Upper Extremity Assessment: Generalized weakness   Lower Extremity Assessment Lower Extremity Assessment: Generalized weakness        Vision Patient Visual Report: No change from baseline            Cognition Arousal/Alertness: Awake/alert Behavior During Therapy: WFL for tasks assessed/performed Overall Cognitive Status: Within Functional Limits for tasks assessed                                                     Pertinent Vitals/  Pain       Pain Assessment Pain Assessment: 0-10 Pain Score: 10-Worst pain ever Pain Descriptors / Indicators: Aching, Dull, Sharp, Restless Pain Intervention(s): Patient requesting pain meds-RN notified, Limited activity within patient's tolerance, Repositioned, Monitored during session   Frequency  Min 2X/week        Progress Toward Goals  OT Goals(current goals can now be found in the care plan section)     Acute Rehab OT Goals Patient Stated Goal: reduce pain OT  Goal Formulation: With patient Time For Goal Achievement: 04/10/22 Potential to Achieve Goals: Fair   AM-PAC OT "6 Clicks" Daily Activity     Outcome Measure   Help from another person eating meals?: A Little Help from another person taking care of personal grooming?: A Little Help from another person toileting, which includes using toliet, bedpan, or urinal?: A Lot Help from another person bathing (including washing, rinsing, drying)?: A Lot Help from another person to put on and taking off regular upper body clothing?: A Little Help from another person to put on and taking off regular lower body clothing?: A Lot 6 Click Score: 15    End of Session Equipment Utilized During Treatment: Rolling walker (2 wheels)  OT Visit Diagnosis: Unsteadiness on feet (R26.81);History of falling (Z91.81);Muscle weakness (generalized) (M62.81);Pain Pain - Right/Left: Left Pain - part of body:  (low back, right side buttocks)   Activity Tolerance Patient limited by pain   Patient Left in bed;with call bell/phone within reach;with bed alarm set   Nurse Communication Patient requests pain meds;Mobility status        Time: 1030-1046 OT Time Calculation (min): 16 min  Charges: OT General Charges $OT Visit: 1 Visit OT Treatments $Therapeutic Activity: 8-22 mins    Paxson Harrower, OTS 03/29/2022, 12:31 PM

## 2022-03-29 NOTE — Telephone Encounter (Signed)
Dr Katrinka Blazing saw as a consult on 03/27/22 for a T12 compression fracture and lumbar stenosis. Per Dr Myer Haff, he needs an appointment with Kennyth Arnold or Duwayne Heck in 2-3 weeks. He will need xray that day. Dr Myer Haff said it is OK to schedule w/ him if both PA's are full. Will be a new patient appointment unless Dr Myer Haff sees him in the hospital before he is discharged.

## 2022-03-29 NOTE — Progress Notes (Signed)
Orthopedic Tech Progress Note Patient Details:  Hector Miller [birth year] [id number]  Called in order to [employer] for a LSO BRACE   Patient ID: Hector Miller, male   DOB: 25-May-1945, 76 y.o.   MRN: [id number]  Hector Miller 03/29/2022, 8:12 AM

## 2022-03-30 DIAGNOSIS — A419 Sepsis, unspecified organism: Secondary | ICD-10-CM | POA: Diagnosis not present

## 2022-03-30 DIAGNOSIS — R6520 Severe sepsis without septic shock: Secondary | ICD-10-CM | POA: Diagnosis not present

## 2022-03-30 LAB — CBC
HCT: 36.5 % — ABNORMAL LOW (ref 39.0–52.0)
Hemoglobin: 12.5 g/dL — ABNORMAL LOW (ref 13.0–17.0)
MCH: 35 pg — ABNORMAL HIGH (ref 26.0–34.0)
MCHC: 34.2 g/dL (ref 30.0–36.0)
MCV: 102.2 fL — ABNORMAL HIGH (ref 80.0–100.0)
Platelets: 101 10*3/uL — ABNORMAL LOW (ref 150–400)
RBC: 3.57 MIL/uL — ABNORMAL LOW (ref 4.22–5.81)
RDW: 13.3 % (ref 11.5–15.5)
WBC: 6 10*3/uL (ref 4.0–10.5)
nRBC: 0 % (ref 0.0–0.2)

## 2022-03-30 LAB — GLUCOSE, CAPILLARY
Glucose-Capillary: 174 mg/dL — ABNORMAL HIGH (ref 70–99)
Glucose-Capillary: 196 mg/dL — ABNORMAL HIGH (ref 70–99)
Glucose-Capillary: 198 mg/dL — ABNORMAL HIGH (ref 70–99)
Glucose-Capillary: 220 mg/dL — ABNORMAL HIGH (ref 70–99)

## 2022-03-30 LAB — BASIC METABOLIC PANEL
Anion gap: 6 (ref 5–15)
BUN: 12 mg/dL (ref 8–23)
CO2: 24 mmol/L (ref 22–32)
Calcium: 8.6 mg/dL — ABNORMAL LOW (ref 8.9–10.3)
Chloride: 109 mmol/L (ref 98–111)
Creatinine, Ser: 0.77 mg/dL (ref 0.61–1.24)
GFR, Estimated: >60 mL/min (ref >60)
Glucose, Bld: 200 mg/dL — ABNORMAL HIGH (ref 70–99)
Potassium: 4.2 mmol/L (ref 3.5–5.1)
Sodium: 139 mmol/L (ref 135–145)

## 2022-03-30 LAB — CULTURE, BLOOD (ROUTINE X 2)
Culture: NO GROWTH
Culture: NO GROWTH
Special Requests: ADEQUATE

## 2022-03-30 LAB — CK: Total CK: 414 U/L — ABNORMAL HIGH (ref 49–397)

## 2022-03-30 MED ORDER — METHOCARBAMOL 500 MG PO TABS
500.0000 mg | ORAL_TABLET | Freq: Three times a day (TID) | ORAL | Status: DC
  Administered 2022-03-30 – 2022-04-01 (×6): 500 mg via ORAL
  Filled 2022-03-30 (×6): qty 1

## 2022-03-30 NOTE — Hospital Course (Addendum)
Hector Miller is a 76 y.o. male with a PMH significant for coronary artery disease, diabetes mellitus, hypertension, dyslipidemia, alcohol dependence. He presented from [facility] to [facility] ED on 03/25/2022 with fall out of bed and unable to stand from ground. He was on the floor overnight.  In the ED, it was found that they had tachycardia to 124, tachypnea to 22, and was normotensive and stable ORA. Afebrile. Significant findings included troponin of 101, serum glucose of 325, bicarbonate level of 11 with an anion gap of 25, lactic acid 8.9, white count of 13.6. urinalysis glucose >500, hgb large but negative for RBC (likely from elevated CK in prolonged time down), not indicative of infection.  Head and neck CT were negative for acute changes. Was  initially treated for severe sepsis with unknown source with 3 L of IV fluids in the ER as well as a dose of cefepime, Flagyl and vancomycin. He also received 10 units of NovoLog. Admitted to medicine service for further workup and management of severe sepsis from unknown source  11/12- without a clear source of infection and lacking typical signs of infection, discontinued the sepsis course started on presentation. Patient's main complaint is his lower back pain and states he can hardly move or feel his legs. Negative for bowel or bladder incontinence. He is having urine retention. T12/L1 Fracture (see CT results). Neurosurgery consulted. Urine retention NOT d/t spinal issue.  03/29/22 - PT/OT evaluation. SNF placement. Neurosurgery recs continue PT/OT, brace on when OOB, follow as outpatient. Urology advise continue foley and treat BPH, follow outpatient.  11/15-11/16: awaiting SNF. needs auth through the [place]  11/17: SNF auth approved, stable for discharge   Consultants:  Neurosurgery Urology   Procedures: none      ASSESSMENT & PLAN:   Principal Problem:   SIRS due to infectious process with acute organ dysfunction  (HCC) Active Problems:   Type 2 diabetes mellitus with hyperglycemia (HCC)   AKI (acute kidney injury) (HCC)   Elevated troponin   Alcohol abuse   Depression   HTN (hypertension)   Gastroesophageal reflux disease without esophagitis   Coronary artery disease   Traumatic rhabdomyolysis (HCC)   High anion gap metabolic acidosis   Sepsis (HCC)   Alcohol withdrawal syndrome without complication (HCC)   Dehydration   Acute urinary retention   Acute low back pain with sciatica   Fx lumbar vertebra-closed (HCC)   Radiculopathy   SIRS (HCC) criteria on presentation. Generalized weakness SIRS/Sepsis criteria have resolved.  No infectious source on chest xray, skin, or urinalysis.  Alert and oriented to baseline so do not suspect CNS infection.  Suspect multifactorial in nature for initial presentation of fall and weakness including likely viral infection, acidosis in relation to DM, and possibly intoxication, likely SIRS rather than Sepsis.  CK levels increased while admitted and patient was on statin medication which was discontinued due to possible statin induced myositis. electrolyte monitoring and replacement PRN discontinued IV Abx with neg cultures and no systemic signs of infection.  PT/OT discontinued statin therapy   Lower back pain- acute on chronic.  Lumbar MRI shows significant degenerative changes in lumbar spine including stenosis of canal and foramen and fracture of T12. CT thoracic spine to further evaluate the fracture shows that patient actually has extra vertebrae T12/L1 and 6 lumbar.  Analgesia PRN: heating pad, tylenol PRN, voltaren gel encourage OOB PT/OT neurosurgery following -->  continue work w/ PT/OT OOB as able w/ brace will follow  outpatient    Acute urinary retention-Foley placed. Good UOP.  Per neurosurgery, not related to the changes seen on spine images urology consulted, Dr. Lonna Cobb -->  continue tamsulosin and finasteride.  continue foley 7-10  days w/ office follow up with Dr Lonna Cobb for removal / voiding trial If he has persistent retention and no sensation of bladder fullness, will plan for urodynamic study outpatient    Type 2 diabetes mellitus with hyperglycemia (HCC)- A1c 6.8. glucose >500 in urinalysis. Positive anion gap on presentation now closed. Non-insulin dependent at baseline. Glycemic control with sliding scale insulin Maintain consistent carbohydrate diet   AKI- prerenal.  Resolved to normal baseline with hydration.   Elevated troponin  CAD- denies chest pain.  Continue Crestor  low-dose aspirin continue low-dose beta-blockers   Alcohol dependence- denies h/o withdrawals. Denies current tremors continue thiamine/folic acid continue CIWA protocol   Depression Continue Remeron and Lexapro   Traumatic rhabdomyolysis (HCC)-improving Patient is status post fall with elevated CK levels monitor CK levels IV fluids completed   Gastroesophageal reflux disease without esophagitis Continue PPI    DVT prophylaxis: SCD Pertinent IV fluids/nutrition: none Central lines / invasive devices: Foley catheter   Code Status: DNR Family Communication: none at this time   Disposition: inpatient pending SNF placement / auth  TOC needs: SNF  Barriers to discharge / significant pending items: SNF

## 2022-03-30 NOTE — Telephone Encounter (Signed)
Patient is still admitted.

## 2022-03-30 NOTE — TOC Progression Note (Signed)
Transition of Care [facility]) - Progression Note    Patient Details  Name: Hector Miller MRN: [id number] Date of Birth: 06-18-1945  Transition of [facility]) CM/SW Contact  Marlowe Sax, RN Phone Number: 03/30/2022, 10:38 AM  Clinical Narrative:    The patient will need a GEC filled out for the [facility], [facility] is reviewing still and let me know, [facility]        Expected Discharge Plan and Services                                                 Social Determinants of Health (SDOH) Interventions    Readmission Risk Interventions    05/16/2021   11:08 AM  Readmission Risk Prevention Plan  Post Dischage Appt Complete  Medication Screening Complete  Transportation Screening Complete

## 2022-03-30 NOTE — TOC Progression Note (Signed)
Transition of Care [facility]) - Progression Note    Patient Details  Name: Hector Miller MRN: [id number] Date of Birth: [birth year]  Transition of Care [facility]) CM/SW Contact  Marlowe Sax, RN Phone Number: 03/30/2022, 8:10 AM  Clinical Narrative:     Reached out to [facility] to inquire if they will be offering a bed, left a secure voice mail, awaiting a response        Expected Discharge Plan and Services                                                 Social Determinants of Health (SDOH) Interventions    Readmission Risk Interventions    05/16/2021   11:08 AM  Readmission Risk Prevention Plan  Post Dischage Appt Complete  Medication Screening Complete  Transportation Screening Complete

## 2022-03-30 NOTE — Progress Notes (Signed)
Occupational Therapy Treatment Patient Details Name: Hector Miller MRN: [id number] DOB: 1945-08-03 Today's Date: 03/30/2022   History of present illness Pt admitted to [facility] on 03/25/22 for syncopal event and mechanical fall; unable to stand from the ground and was on the floor all night. Found to be hyperglycemic, tachycardic, and tachypneic, upon EMS arrival. Dx with severe sepsis. Elevated troponin levels secondary to demand ischemia from tachycardia. Significant PMH includes: CAD s/p CABG, DM, HTN, HLD, and alcohol abuse. Imaging negative for acute abnormalities.   OT comments  Patient in bed upon arrival, agreeable to OT treatment session. Patient was able to perform log roll into side lying with min A. Transferred from side lying to sitting EOB with mod A and VC for technique. Patient transferred sit>stand with mod A +2. Improved standing balance observed. Patient was able to take 3 side steps towards [place] and transfer into the recliner with min guard/ supervision. Patient left in recliner with call bell in reach, chair alarm set, and all needs met.    Recommendations for follow up therapy are one component of a multi-disciplinary discharge planning process, led by the attending physician.  Recommendations may be updated based on patient status, additional functional criteria and insurance authorization.    Follow Up Recommendations  Skilled nursing-short term rehab (<3 hours/day)     Assistance Recommended at Discharge Frequent or constant Supervision/Assistance  Patient can return home with the following  A lot of help with walking and/or transfers;A little help with bathing/dressing/bathroom;Help with stairs or ramp for entrance;Assist for transportation;Assistance with cooking/housework   Equipment Recommendations  Other (comment) (Defer to next venue of care.)       Precautions / Restrictions Precautions Precautions: Fall Restrictions Weight Bearing Restrictions: No        Mobility Bed Mobility Overal bed mobility: Needs Assistance Bed Mobility: Rolling, Sidelying to Sit Rolling: Min assist Sidelying to sit: Mod assist       General bed mobility comments: LSO brace donned while sitting EOB.    Transfers Overall transfer level: Needs assistance Equipment used: Rolling walker (2 wheels) Transfers: Sit to/from Stand Sit to Stand: Mod assist, From elevated surface           General transfer comment: needs cues for safe hand placement. RW used. Improved standing posture with less post leaning     Balance Overall balance assessment: Needs assistance Sitting-balance support: Bilateral upper extremity supported Sitting balance-Leahy Scale: Good     Standing balance support: Bilateral upper extremity supported, Reliant on assistive device for balance, During functional activity Standing balance-Leahy Scale: Fair                             ADL either performed or assessed with clinical judgement    Extremity/Trunk Assessment Upper Extremity Assessment Upper Extremity Assessment: Generalized weakness   Lower Extremity Assessment Lower Extremity Assessment: Generalized weakness        Vision Patient Visual Report: No change from baseline            Cognition Arousal/Alertness: Awake/alert Behavior During Therapy: WFL for tasks assessed/performed Overall Cognitive Status: Within Functional Limits for tasks assessed                                                     Pertinent Vitals/  Pain       Pain Assessment Pain Assessment: Faces Faces Pain Scale: Hurts little more Pain Location: R side low back Pain Descriptors / Indicators: Aching, Sharp Pain Intervention(s): Limited activity within patient's tolerance, Monitored during session, Repositioned   Frequency  Min 2X/week        Progress Toward Goals  OT Goals(current goals can now be found in the care plan section)     Acute Rehab OT  Goals Patient Stated Goal: get better OT Goal Formulation: With patient Time For Goal Achievement: 04/10/22 Potential to Achieve Goals: Fair   AM-PAC OT "6 Clicks" Daily Activity     Outcome Measure   Help from another person eating meals?: A Little Help from another person taking care of personal grooming?: A Little Help from another person toileting, which includes using toliet, bedpan, or urinal?: A Lot Help from another person bathing (including washing, rinsing, drying)?: A Lot Help from another person to put on and taking off regular upper body clothing?: A Little Help from another person to put on and taking off regular lower body clothing?: A Lot 6 Click Score: 15    End of Session Equipment Utilized During Treatment: Rolling walker (2 wheels)  OT Visit Diagnosis: Unsteadiness on feet (R26.81);History of falling (Z91.81);Muscle weakness (generalized) (M62.81);Pain Pain - part of body:  (low back)   Activity Tolerance Patient tolerated treatment well   Patient Left in chair;with call bell/phone within reach;with chair alarm set   Nurse Communication Mobility status        Time: 7282-0601 OT Time Calculation (min): 13 min  Charges: OT General Charges $OT Visit: 1 Visit OT Treatments $Therapeutic Activity: 8-22 mins    Jaliel Deavers, OTS 03/30/2022, 3:40 PM

## 2022-03-30 NOTE — TOC Progression Note (Signed)
Transition of Care [facility]) - Progression Note    Patient Details  Name: Hector Miller MRN: [id number] Date of Birth: Oct 30, 1945  [facility]) CM/SW Contact  Marlowe Sax, RN Phone Number: 03/30/2022, 1:35 PM  Clinical Narrative:     Spoke with the patient and he accepted the bed at [facility], I explained that the [place] would have to approve, a GEC form was received and will be filled out and sent back       Expected Discharge Plan and Services                                                 Social Determinants of Health (SDOH) Interventions    Readmission Risk Interventions    05/16/2021   11:08 AM  Readmission Risk Prevention Plan  Post Dischage Appt Complete  Medication Screening Complete  Transportation Screening Complete

## 2022-03-30 NOTE — Progress Notes (Signed)
PROGRESS NOTE    Hector Miller   WUJ:[id number] DOB: 12/08/1945  DOA: 03/25/2022 Date of Service: 03/30/22 PCP: [facility]     Brief Narrative / Hospital Course:  Hector Miller is a 76 y.o. male with a PMH significant for coronary artery disease, diabetes mellitus, hypertension, dyslipidemia, alcohol dependence. [facility] presented from [facility] to the ED on 03/25/2022 with fall out of bed and unable to stand from ground. He was on the floor overnight.  In the ED, it was found that they had tachycardia to 124, tachypnea to 22, and was normotensive and stable ORA. Afebrile. Significant findings included troponin of 101, serum glucose of 325, bicarbonate level of 11 with an anion gap of 25, lactic acid 8.9, white count of 13.6. urinalysis glucose >500, hgb large but negative for RBC (likely from elevated CK in prolonged time down), not indicative of infection.  Head and neck CT were negative for acute changes. Was  initially treated for severe sepsis with unknown source with 3 L of IV fluids in the ER as well as a dose of cefepime, Flagyl and vancomycin. He also received 10 units of NovoLog. Admitted to medicine service for further workup and management of severe sepsis from unknown source  11/12- without a clear source of infection and lacking typical signs of infection, discontinued the sepsis course started on presentation. Patient's main complaint is his lower back pain and states he can hardly move or feel his legs. Negative for bowel or bladder incontinence. He is having urine retention. T12/L1? Fracture (see CT results). Neurosurgery consulted.  03/29/22 - PT/OT evaluation. SNF placement. Neurosurgery recs continue PT/OT, brace on when OOB, follow as outpatient. Urology advise continue foley and treat BPH, follow outpatient.  11/15: awaiting SNF. needs auth through the [place]   Consultants:  Neurosurgery Urology   Procedures: none      ASSESSMENT &  PLAN:   Principal Problem:   Severe sepsis (HCC) Active Problems:   Type 2 diabetes mellitus with hyperglycemia (HCC)   AKI (acute kidney injury) (HCC)   Elevated troponin   Alcohol abuse   Depression   HTN (hypertension)   Gastroesophageal reflux disease without esophagitis   Coronary artery disease   Traumatic rhabdomyolysis (HCC)   High anion gap metabolic acidosis   Sepsis (HCC)   Alcohol withdrawal syndrome without complication (HCC)   Dehydration   Acute urinary retention   Acute low back pain with sciatica   Fx lumbar vertebra-closed (HCC)   Radiculopathy   Severe sepsis (HCC) criteria on presentation. Generalized weakness- Sepsis criteria have resolved.  No known source on chest xray, skin, or urinalysis.  Alert and oriented to baseline so do not suspect CNS infection.  Suspect multifactorial in nature for initial presentation of fall and weakness including likely viral infection, acidosis in relation to DM, and possibly intoxication, likely SIRS rather than Sepsis.  CK levels increased while admitted and patient was on statin medication which was discontinued due to possible statin induced myositis. electrolyte monitoring and replacement PRN discontinued IV Abx with neg cultures and no systemic signs of infection.  PT/OT discontinued statin therapy   Lower back pain- acute on chronic.  Lumbar MRI shows significant degenerative changes in lumbar spine including stenosis of canal and foramen and fracture of T12. CT thoracic spine to further evaluate the fracture shows that patient actually has extra vertebrae T12/L1 and 6 lumbar.  - Analgesia PRN - heating pad - tylenol PRN - voltaren  gel - encourage OOB - PT/OT - neurosurgery following --> continue work w/ PT/OT, OOB as able w/ brace, will follow outpatient    Acute urinary retention-Foley placed. Good UOP.  Per neurosurgery, not related to the changes seen on spine images - urology consulted, Dr. Lonna Cobb -  continue tamsulosin and finasteride.  - continue foley 7-10 days w/ office follow up with Dr Lonna Cobb for removal / voiding trial - strict I/O -  If he has persistent retention and no sensation of bladder fullness, will plan for urodynamic study outpatient    Type 2 diabetes mellitus with hyperglycemia (HCC)- A1c 6.8. glucose >500 in urinalysis. Positive anion gap on presentation now closed. Non-insulin dependent at baseline. Glycemic control with sliding scale insulin Maintain consistent carbohydrate diet   AKI- prerenal. Resolved to normal baseline with hydration.   Elevated troponin  CAD- denies chest pain.  - Continue Crestor  - low-dose aspirin - continue low-dose beta-blockers   Alcohol dependence- denies h/o withdrawals. Denies current tremors - continue thiamine/folic acid - continue CIWA protocol    Depression - Continue Remeron and Lexapro   Traumatic rhabdomyolysis (HCC)-improving Patient is status post fall with elevated CK levels - monitor CK levels - IV fluids   Gastroesophageal reflux disease without esophagitis Continue PPI    DVT prophylaxis: SCD Pertinent IV fluids/nutrition: none Central lines / invasive devices: Foley catheter   Code Status: DNR Family Communication: none at this time   Disposition: inpatient pending SNF placement / auth  TOC needs: SNF  Barriers to discharge / significant pending items: SNF              Subjective:  Patient reports back pain is bothering him but otherwise no concerns. Eating/drinking normally, no CP/SOB,        Objective:  Vitals:   03/29/22 0800 03/29/22 1600 03/30/22 0335 03/30/22 0805  BP: (!) 119/92 105/70 126/70 116/81  Pulse: 76 79 78 85  Resp: 17 17 18 18   Temp: 99 F (37.2 C) 97.8 F (36.6 C) 98.1 F (36.7 C) (!) 97.5 F (36.4 C)  TempSrc:   Oral Oral  SpO2: 96% 95% 94% 97%  Weight:      Height:        Intake/Output Summary (Last 24 hours) at 03/30/2022 1504 Last data filed  at 03/30/2022 1431 Gross per 24 hour  Intake 600 ml  Output 2100 ml  Net -1500 ml   Filed Weights   03/25/22 1157 03/26/22 1051  Weight: 102.2 kg 95.3 kg    Examination: Constitutional:  VS as above General Appearance: alert, well-developed, well-nourished, NAD Respiratory: Normal respiratory effort No wheeze No rhonchi No rales Cardiovascular: S1/S2 normal No murmur No rub/gallop auscultated No lower extremity edema Gastrointestinal: No tenderness Musculoskeletal:  No clubbing/cyanosis of digits Symmetrical movement in all extremities Neurological: No cranial nerve deficit on limited exam Alert Psychiatric: Normal judgment/insight Normal mood and affect       Scheduled Medications:   aspirin  81 mg Oral Daily   Chlorhexidine Gluconate Cloth  6 each Topical Daily   cholecalciferol  1,000 Units Oral Daily   enoxaparin (LOVENOX) injection  40 mg Subcutaneous Q24H   finasteride  5 mg Oral Daily   folic acid  1 mg Oral Daily   gabapentin  300 mg Oral TID   insulin aspart  0-15 Units Subcutaneous TID WC   metoprolol tartrate  12.5 mg Oral BID   mirtazapine  30 mg Oral QHS   pantoprazole  40 mg  Oral Daily   polyethylene glycol  17 g Oral Daily   tamsulosin  0.8 mg Oral Daily   thiamine  100 mg Oral Daily    Continuous Infusions:   PRN Medications:  acetaminophen **OR** acetaminophen, albuterol, HYDROmorphone (DILAUDID) injection, naphazoline-glycerin, ondansetron **OR** ondansetron (ZOFRAN) IV, oxyCODONE, traZODone  Antimicrobials:  Anti-infectives (From admission, onward)    Start     Dose/Rate Route Frequency Ordered Stop   03/27/22 2200  Ampicillin-Sulbactam (UNASYN) 3 g in sodium chloride 0.9 % 100 mL IVPB  Status:  Discontinued        3 g 200 mL/hr over 30 Minutes Intravenous Every 6 hours 03/27/22 1536 03/28/22 0937   03/26/22 2200  ceFEPIme (MAXIPIME) 2 g in sodium chloride 0.9 % 100 mL IVPB  Status:  Discontinued        2 g 200 mL/hr over 30  Minutes Intravenous Every 8 hours 03/26/22 1002 03/27/22 1536   03/26/22 1200  vancomycin (VANCOCIN) IVPB 1000 mg/200 mL premix  Status:  Discontinued        1,000 mg 200 mL/hr over 60 Minutes Intravenous Every 12 hours 03/26/22 1002 03/27/22 1536   03/25/22 2230  metroNIDAZOLE (FLAGYL) IVPB 500 mg  Status:  Discontinued        500 mg 100 mL/hr over 60 Minutes Intravenous Every 12 hours 03/25/22 1132 03/27/22 1536   03/25/22 2200  ceFEPIme (MAXIPIME) 2 g in sodium chloride 0.9 % 100 mL IVPB  Status:  Discontinued        2 g 200 mL/hr over 30 Minutes Intravenous Every 12 hours 03/25/22 1432 03/26/22 1002   03/25/22 2000  vancomycin (VANCOREADY) IVPB 1500 mg/300 mL  Status:  Discontinued        1,500 mg 150 mL/hr over 120 Minutes Intravenous Every 24 hours 03/25/22 1432 03/26/22 1002   03/25/22 1145  ceFEPIme (MAXIPIME) 2 g in sodium chloride 0.9 % 100 mL IVPB  Status:  Discontinued        2 g 200 mL/hr over 30 Minutes Intravenous  Once 03/25/22 1132 03/25/22 1133   03/25/22 1145  vancomycin (VANCOCIN) IVPB 1000 mg/200 mL premix  Status:  Discontinued        1,000 mg 200 mL/hr over 60 Minutes Intravenous  Once 03/25/22 1132 03/25/22 1133   03/25/22 0945  ceFEPIme (MAXIPIME) 2 g in sodium chloride 0.9 % 100 mL IVPB        2 g 200 mL/hr over 30 Minutes Intravenous  Once 03/25/22 0939 03/25/22 1037   03/25/22 0945  metroNIDAZOLE (FLAGYL) IVPB 500 mg        500 mg 100 mL/hr over 60 Minutes Intravenous  Once 03/25/22 0939 03/25/22 1125   03/25/22 0945  vancomycin (VANCOCIN) IVPB 1000 mg/200 mL premix        1,000 mg 200 mL/hr over 60 Minutes Intravenous  Once 03/25/22 0939 03/25/22 1219       Data Reviewed: I have personally reviewed following labs and imaging studies  CBC: Recent Labs  Lab 03/25/22 1826 03/26/22 0500 03/26/22 0644 03/27/22 0507 03/28/22 0518 03/29/22 0531 03/30/22 0425  WBC 9.5   < > 8.6 6.0 6.1 5.9 6.0  NEUTROABS 8.2*  --  6.8  --   --   --   --   HGB 14.0    < > 14.3 11.7* 12.7* 12.2* 12.5*  HCT 40.9   < > 41.5 33.7* 36.7* 36.1* 36.5*  MCV 100.2*   < > 102.0* 100.3* 100.0 101.7* 102.2*  PLT 83*   < > 99* 66* 80* 92* 101*   < > = values in this interval not displayed.   Basic Metabolic Panel: Recent Labs  Lab 03/25/22 1020 03/25/22 1826 03/26/22 0500 03/26/22 0644 03/27/22 0507 03/28/22 0518 03/29/22 0531 03/30/22 0425  NA  --  136 138  --  134* 137 137 139  K  --  4.0 3.6  --  3.4* 3.4* 4.1 4.2  CL  --  103 113*  --  108 105 108 109  CO2  --  21* 16*  --  20* 24 21* 24  GLUCOSE  --  182* 87  --  140* 186* 172* 200*  BUN  --  15 9  --  CREATININE  --  1.11 0.56*  --  0.80 0.84 0.73 0.77  CALCIUM  --  8.6* 6.7*  --  7.6* 8.5* 8.1* 8.6*  MG 2.8* 2.2  --  2.3  --   --   --   --   PHOS  --   --   --  2.5  --   --   --   --    GFR: Estimated Creatinine Clearance: 92.6 mL/min (by C-G formula based on SCr of 0.77 mg/dL). Liver Function Tests: Recent Labs  Lab 03/25/22 0835 03/25/22 1826 03/26/22 0644 03/28/22 0518 03/29/22 0531  AST 85* 148* 313* 116* 77*  ALT 39 41 74* 51* 44  ALKPHOS 83 57 72 60 61  BILITOT 2.2* 1.7* 2.3* 1.2 0.7  PROT 8.3* 6.5 7.8 6.0* 5.5*  ALBUMIN 4.5 3.3* 4.2 2.8* 2.5*   No results for input(s): "LIPASE", "AMYLASE" in the last 168 hours. Recent Labs  Lab 03/26/22 0644  AMMONIA <10   Coagulation Profile: Recent Labs  Lab 03/26/22 0500  INR 1.7*   Cardiac Enzymes: Recent Labs  Lab 03/25/22 0835 03/27/22 1307 03/28/22 0514 03/29/22 0531 03/30/22 0425  CKTOTAL 782* 5,777* 3,058* 917* 414*   BNP (last 3 results) No results for input(s): "PROBNP" in the last 8760 hours. HbA1C: No results for input(s): "HGBA1C" in the last 72 hours. CBG: Recent Labs  Lab 03/29/22 1219 03/29/22 1650 03/30/22 0553 03/30/22 0845 03/30/22 1144  GLUCAP 177* 221* 174* 196* 198*   Lipid Profile: No results for input(s): "CHOL", "HDL", "LDLCALC", "TRIG", "CHOLHDL", "LDLDIRECT" in the last 72  hours. Thyroid Function Tests: No results for input(s): "TSH", "T4TOTAL", "FREET4", "T3FREE", "THYROIDAB" in the last 72 hours. Anemia Panel: No results for input(s): "VITAMINB12", "FOLATE", "FERRITIN", "TIBC", "IRON", "RETICCTPCT" in the last 72 hours. Urine analysis:    Component Value Date/Time   COLORURINE YELLOW (A) 03/25/2022 1827   APPEARANCEUR CLOUDY (A) 03/25/2022 1827   LABSPEC 1.017 03/25/2022 1827   PHURINE 5.0 03/25/2022 1827   GLUCOSEU >=500 (A) 03/25/2022 1827   HGBUR LARGE (A) 03/25/2022 1827   BILIRUBINUR NEGATIVE 03/25/2022 1827   KETONESUR 20 (A) 03/25/2022 1827   PROTEINUR 100 (A) 03/25/2022 1827   UROBILINOGEN 0.2 03/26/2013 1219   NITRITE NEGATIVE 03/25/2022 1827   LEUKOCYTESUR NEGATIVE 03/25/2022 1827   Sepsis Labs: (procalcitonin:4,lacticidven:4)  Recent Results (from the past 240 hour(s))  Blood culture (routine x 2)     Status: None   Collection Time: 03/25/22  8:34 AM   Specimen: Right Antecubital; Blood  Result Value Ref Range Status   Specimen Description RIGHT ANTECUBITAL  Final   Special Requests   Final    BOTTLES DRAWN AEROBIC AND ANAEROBIC Blood Culture results may  not be optimal due to an inadequate volume of blood received in culture bottles   Culture   Final    NO GROWTH 5 DAYS Performed at [facility], [address] [place], [place] [address]    Report Status 03/30/2022 FINAL  Final  Resp Panel by RT-PCR (Flu A&B, Covid) Anterior Nasal Swab     Status: None   Collection Time: 03/25/22  8:35 AM   Specimen: Anterior Nasal Swab  Result Value Ref Range Status   SARS Coronavirus 2 by RT PCR NEGATIVE NEGATIVE Final    Comment: (NOTE) SARS-CoV-2 target nucleic acids are NOT DETECTED.  The SARS-CoV-2 RNA is generally detectable in upper respiratory specimens during the acute phase of infection. The lowest concentration of SARS-CoV-2 viral copies this assay can detect is 138 copies/mL. A negative result does not  preclude SARS-Cov-2 infection and should not be used as the sole basis for treatment or other patient management decisions. A negative result may occur with  improper specimen collection/handling, submission of specimen other than nasopharyngeal swab, presence of viral mutation(s) within the areas targeted by this assay, and inadequate number of viral copies(<138 copies/mL). A negative result must be combined with clinical observations, patient history, and epidemiological information. The expected result is Negative.  Fact Sheet for Patients:  BloggerCourse.comhttps://www.fda.gov/media/152166/download  Fact Sheet for Healthcare Providers:  SeriousBroker.ithttps://www.fda.gov/media/152162/download  This test is no t yet approved or cleared by the [place] FDA and  has been authorized for detection and/or diagnosis of SARS-CoV-2 by FDA under an Emergency Use Authorization (EUA). This EUA will remain  in effect (meaning this test can be used) for the duration of the COVID-19 declaration under Section 564(b)(1) of the Act, 21 U.S.C.section 360bbb-3(b)(1), unless the authorization is terminated  or revoked sooner.       Influenza A by PCR NEGATIVE NEGATIVE Final   Influenza B by PCR NEGATIVE NEGATIVE Final    Comment: (NOTE) The Xpert Xpress SARS-CoV-2/FLU/RSV plus assay is intended as an aid in the diagnosis of influenza from Nasopharyngeal swab specimens and should not be used as a sole basis for treatment. Nasal washings and aspirates are unacceptable for Xpert Xpress SARS-CoV-2/FLU/RSV testing.  Fact Sheet for Patients: BloggerCourse.comhttps://www.fda.gov/media/152166/download  Fact Sheet for Healthcare Providers: SeriousBroker.ithttps://www.fda.gov/media/152162/download  This test is not yet approved or cleared by the [place] FDA and has been authorized for detection and/or diagnosis of SARS-CoV-2 by FDA under an Emergency Use Authorization (EUA). This EUA will remain in effect (meaning this test can be used) for the  duration of the COVID-19 declaration under Section 564(b)(1) of the Act, 21 U.S.C. section 360bbb-3(b)(1), unless the authorization is terminated or revoked.  Performed at [facility], [address] [place], [place] [address]   Blood culture (routine x 2)     Status: None   Collection Time: 03/25/22  8:47 AM   Specimen: Right Antecubital; Blood  Result Value Ref Range Status   Specimen Description RIGHT ANTECUBITAL  Final   Special Requests   Final    BOTTLES DRAWN AEROBIC AND ANAEROBIC Blood Culture adequate volume   Culture   Final    NO GROWTH 5 DAYS Performed at [facility], [address] [place], [place] [address]    Report Status 03/30/2022 FINAL  Final         Radiology Studies: CT HEAD WO CONTRAST (5MM)  Result Date: 03/25/2022 CLINICAL DATA:  Neuro deficit EXAM: CT HEAD WITHOUT CONTRAST TECHNIQUE: Contiguous axial images were obtained from the  base of the skull through the vertex without intravenous contrast. RADIATION DOSE REDUCTION: This exam was performed according to the departmental dose-optimization program which includes automated exposure control, adjustment of the mA and/or kV according to patient size and/or use of iterative reconstruction technique. COMPARISON:  CT brain 03/25/2022 FINDINGS: Brain: No acute territorial infarction, hemorrhage or intracranial mass. Atrophy and mild chronic small vessel ischemic changes of the white matter. Stable ventricle size. Chronic lacunar infarct versus dilated perivascular space in the right posterior sub insula. Small chronic right cerebellar infarcts. Vascular: No hyperdense vessels.  Carotid vascular calcification. Skull: Normal. Negative for fracture or focal lesion. Sinuses/Orbits: Mild mucosal thickening in the sinuses Other: None IMPRESSION: 1. No CT evidence for acute intracranial abnormality. 2. Atrophy and chronic small vessel ischemic changes of the white matter. Electronically Signed   By:  Jasmine Pang M.D.   On: 03/25/2022 18:22   CT Cervical Spine Wo Contrast  Result Date: 03/25/2022 CLINICAL DATA:  Neck trauma. EXAM: CT CERVICAL SPINE WITHOUT CONTRAST TECHNIQUE: Multidetector CT imaging of the cervical spine was performed without intravenous contrast. Multiplanar CT image reconstructions were also generated. RADIATION DOSE REDUCTION: This exam was performed according to the departmental dose-optimization program which includes automated exposure control, adjustment of the mA and/or kV according to patient size and/or use of iterative reconstruction technique. COMPARISON:  January 19, 2022 FINDINGS: Alignment: Normal. Skull base and vertebrae: No acute fracture. No primary bone lesion or focal pathologic process. Soft tissues and spinal canal: No prevertebral fluid or swelling. No visible canal hematoma. Disc levels: Mild multilevel osteoarthritic changes. Right facet arthropathy at C3-C4 with right neural foraminal stenosis. Upper chest: Negative. Other: None. IMPRESSION: 1. No acute fracture or subluxation of the cervical spine. 2. Mild multilevel osteoarthritic changes. Right facet arthropathy at C3-C4 with right neural foraminal stenosis. Electronically Signed   By: Ted Mcalpine M.D.   On: 03/25/2022 09:26   CT HEAD WO CONTRAST ( )  Result Date: 03/25/2022 CLINICAL DATA:  Head trauma. EXAM: CT HEAD WITHOUT CONTRAST TECHNIQUE: Contiguous axial images were obtained from the base of the skull through the vertex without intravenous contrast. RADIATION DOSE REDUCTION: This exam was performed according to the departmental dose-optimization program which includes automated exposure control, adjustment of the mA and/or kV according to patient size and/or use of iterative reconstruction technique. COMPARISON:  January 19, 2022 FINDINGS: Brain: No evidence of acute infarction, hemorrhage, hydrocephalus, extra-axial collection or mass lesion/mass effect. Marked brain parenchymal  volume loss and moderate deep white matter microangiopathy. Vascular: No hyperdense vessel or unexpected calcification. Skull: Normal. Negative for fracture or focal lesion. Sinuses/Orbits: No acute finding. Other: None. IMPRESSION: 1. No acute intracranial abnormality. 2. Marked brain parenchymal volume loss and moderate deep white matter microangiopathy. Electronically Signed   By: Ted Mcalpine M.D.   On: 03/25/2022 09:23   DG Chest Portable 1 View  Result Date: 03/25/2022 CLINICAL DATA:  Sepsis. EXAM: PORTABLE CHEST 1 VIEW COMPARISON:  May 13, 2021. FINDINGS: The heart size and mediastinal contours are within normal limits. Sternotomy wires are noted. Both lungs are clear. The visualized skeletal structures are unremarkable. IMPRESSION: No active disease. Electronically Signed   By: Lupita Raider M.D.   On: 03/25/2022 09:14            LOS: 5 days    Time spent: n/a    Sunnie Nielsen, DO [facility] 03/30/2022, 3:04 PM   Staff may message me via secure chat in Epic  but this may  not receive immediate response,  please page for urgent matters!  If 7PM-7AM, please contact night-coverage [email or web address]  Dictation software was used to generate the above note. Typos may occur and escape review, as with typed/written notes. Please contact Dr Lyn Hollingshead directly for clarity if needed.

## 2022-03-30 NOTE — Progress Notes (Signed)
Physical Therapy Treatment Patient Details Name: Hector Miller MRN: [id number] DOB: [birth year] Today's Date: 03/30/2022   History of Present Illness Pt admitted to [facility] on 03/25/22 for syncopal event and mechanical fall; unable to stand from the ground and was on the floor all night. Found to be hyperglycemic, tachycardic, and tachypneic, upon EMS arrival. Dx with severe sepsis. Elevated troponin levels secondary to demand ischemia from tachycardia. Significant PMH includes: CAD s/p CABG, DM, HTN, HLD, and alcohol abuse. Imaging negative for acute abnormalities.    PT Comments    Pt is making good progress towards goals with ability to ambulate this date. Premedicated prior to mobilization and LSO donned. Still requires bed to be significantly elevated prior to standing attempts. Improved balance once standing. Continues to be motivated to participate.    Recommendations for follow up therapy are one component of a multi-disciplinary discharge planning process, led by the attending physician.  Recommendations may be updated based on patient status, additional functional criteria and insurance authorization.  Follow Up Recommendations  Skilled nursing-short term rehab (<3 hours/day) Can patient physically be transported by private vehicle: Yes   Assistance Recommended at Discharge Intermittent Supervision/Assistance  Patient can return home with the following A lot of help with walking and/or transfers;Assist for transportation;Help with stairs or ramp for entrance   Equipment Recommendations   (TBD)    Recommendations for Other Services       Precautions / Restrictions Precautions Precautions: Fall Restrictions Weight Bearing Restrictions: No     Mobility  Bed Mobility Overal bed mobility: Needs Assistance Bed Mobility: Supine to Sit, Sit to Supine     Supine to sit: Min assist     General bed mobility comments: improved technique this date. ONce seated at EOB, upright  posture noted. LSO donned in seated position    Transfers Overall transfer level: Needs assistance Equipment used: Rolling walker (2 wheels) Transfers: Sit to/from Stand Sit to Stand: Mod assist, From elevated surface           General transfer comment: needs cues for safe hand placement. RW used. Improved standing posture with less post leaning    Ambulation/Gait Ambulation/Gait assistance: Min assist Gait Distance (Feet): 40 Feet Assistive device: Rolling walker (2 wheels) Gait Pattern/deviations: Step-to pattern       General Gait Details: ambulated with antalgic and cautious gait pattern and RW. Small and short step length noted.   Stairs             Wheelchair Mobility    Modified Rankin (Stroke Patients Only)       Balance Overall balance assessment: Needs assistance Sitting-balance support: Bilateral upper extremity supported Sitting balance-Leahy Scale: Good     Standing balance support: Bilateral upper extremity supported, Reliant on assistive device for balance, During functional activity Standing balance-Leahy Scale: Fair                              Cognition Arousal/Alertness: Awake/alert Behavior During Therapy: WFL for tasks assessed/performed Overall Cognitive Status: Within Functional Limits for tasks assessed                                          Exercises Other Exercises Other Exercises: supine/seated ther-ex performed on B LE including AP, SLRs, hip abd/add, and LAQ. 10 reps performed with supervision    General  Comments        Pertinent Vitals/Pain Pain Assessment Pain Assessment: 0-10 Pain Score: 8  Pain Location: R side low back Pain Descriptors / Indicators: Aching, Dull, Sharp, Restless Pain Intervention(s): Limited activity within patient's tolerance, Repositioned, Patient requesting pain meds-RN notified    Home Living                          Prior Function             PT Goals (current goals can now be found in the care plan section) Acute Rehab PT Goals Patient Stated Goal: I want to try to walk PT Goal Formulation: With patient Time For Goal Achievement: 04/10/22 Potential to Achieve Goals: Fair Progress towards PT goals: Progressing toward goals    Frequency    Min 2X/week      PT Plan Current plan remains appropriate    Co-evaluation              AM-PAC PT "6 Clicks" Mobility   Outcome Measure  Help needed turning from your back to your side while in a flat bed without using bedrails?: A Little Help needed moving from lying on your back to sitting on the side of a flat bed without using bedrails?: A Little Help needed moving to and from a bed to a chair (including a wheelchair)?: A Lot Help needed standing up from a chair using your arms (e.g., wheelchair or bedside chair)?: A Lot Help needed to walk in hospital room?: A Lot Help needed climbing 3-5 steps with a railing? : Total 6 Click Score: 13    End of Session Equipment Utilized During Treatment: Gait belt Activity Tolerance: Patient limited by pain Patient left: in chair;with chair alarm set Nurse Communication: Mobility status PT Visit Diagnosis: Unsteadiness on feet (R26.81);History of falling (Z91.81);Difficulty in walking, not elsewhere classified (R26.2);Muscle weakness (generalized) (M62.81);Pain Pain - Right/Left:  (back) Pain - part of body:  (back)     Time: 0865-7846 PT Time Calculation (min) (ACUTE ONLY): 15 min  Charges:  $Gait Training: 8-22 mins                     Elizabeth Palau, PT, DPT, GCS [phone]    Hector Miller 03/30/2022, 1:47 PM

## 2022-03-30 NOTE — Inpatient Diabetes Management (Incomplete)
Inpatient Diabetes Program Recommendations  AACE/ADA: New Consensus Statement on Inpatient Glycemic Control (2015)  Target Ranges:  Prepandial:   less than 140 mg/dL      Peak postprandial:   less than 180 mg/dL (1-2 hours)      Critically ill patients:  140 - 180 mg/dL   Lab Results  Component Value Date   GLUCAP 198 (H) 03/30/2022   HGBA1C 6.8 (H) 03/25/2022    Review of Glycemic Control  Diabetes history: *** Outpatient Diabetes medications: *** Current orders for Inpatient glycemic control: ***  Inpatient Diabetes Program Recommendations:

## 2022-03-30 NOTE — TOC Progression Note (Signed)
Transition of Care [facility]) - Progression Note    Patient Details  Name: Hector Miller MRN: [id number] Date of Birth: Dec 28, 1945  Transition [facility] Care [facility]) CM/SW Contact  Marlowe Sax, RN Phone Number: 03/30/2022, 1:53 PM  Clinical Narrative:   Completed the [facility] And emailed thru secure Email to [facility], I called Gypsy Balsam and let her know it was sent         Expected Discharge Plan and Services                                                 Social Determinants of Health (SDOH) Interventions    Readmission Risk Interventions    05/16/2021   11:08 AM  Readmission Risk Prevention Plan  Post Dischage Appt Complete  Medication Screening Complete  Transportation Screening Complete

## 2022-03-30 NOTE — TOC Progression Note (Signed)
Transition of Care [facility]) - Progression Note    Patient Details  Name: Cephas Revard MRN: [id number] Date of Birth: 1946/04/07  [facility] Care [facility]) CM/SW Contact  Marlowe Sax, RN Phone Number: 03/30/2022, 11:19 AM  Clinical Narrative:    Inetta Fermo with [facility] called and stated that they will make the bed offer for the patient, they will get Ins approval for the [facility]        Expected Discharge Plan and Services                                                 Social Determinants of Health (SDOH) Interventions    Readmission Risk Interventions    05/16/2021   11:08 AM  Readmission Risk Prevention Plan  Post Dischage Appt Complete  Medication Screening Complete  Transportation Screening Complete

## 2022-03-31 DIAGNOSIS — F32A Depression, unspecified: Secondary | ICD-10-CM | POA: Diagnosis not present

## 2022-03-31 DIAGNOSIS — E1165 Type 2 diabetes mellitus with hyperglycemia: Secondary | ICD-10-CM | POA: Diagnosis not present

## 2022-03-31 DIAGNOSIS — S32009A Unspecified fracture of unspecified lumbar vertebra, initial encounter for closed fracture: Secondary | ICD-10-CM

## 2022-03-31 DIAGNOSIS — I2583 Coronary atherosclerosis due to lipid rich plaque: Secondary | ICD-10-CM

## 2022-03-31 DIAGNOSIS — I2510 Atherosclerotic heart disease of native coronary artery without angina pectoris: Secondary | ICD-10-CM

## 2022-03-31 DIAGNOSIS — R7989 Other specified abnormal findings of blood chemistry: Secondary | ICD-10-CM

## 2022-03-31 DIAGNOSIS — R6511 Systemic inflammatory response syndrome (SIRS) of non-infectious origin with acute organ dysfunction: Secondary | ICD-10-CM

## 2022-03-31 DIAGNOSIS — M5440 Lumbago with sciatica, unspecified side: Secondary | ICD-10-CM | POA: Diagnosis not present

## 2022-03-31 DIAGNOSIS — K219 Gastro-esophageal reflux disease without esophagitis: Secondary | ICD-10-CM

## 2022-03-31 DIAGNOSIS — T796XXA Traumatic ischemia of muscle, initial encounter: Secondary | ICD-10-CM

## 2022-03-31 LAB — GLUCOSE, CAPILLARY
Glucose-Capillary: 168 mg/dL — ABNORMAL HIGH (ref 70–99)
Glucose-Capillary: 157 mg/dL — ABNORMAL HIGH (ref 70–99)
Glucose-Capillary: 191 mg/dL — ABNORMAL HIGH (ref 70–99)

## 2022-03-31 LAB — BLOOD GAS, VENOUS
Acid-base deficit: 2.6 mmol/L — ABNORMAL HIGH (ref 0.0–2.0)
Bicarbonate: 21.8 mmol/L (ref 20.0–28.0)
O2 Saturation: 21.4 %
Patient temperature: 37
pCO2, Ven: 36 mmHg — ABNORMAL LOW (ref 44–60)
pH, Ven: 7.39 (ref 7.25–7.43)

## 2022-03-31 LAB — VITAMIN B1: Vitamin B1 (Thiamine): 215.9 nmol/L — ABNORMAL HIGH (ref 66.5–200.0)

## 2022-03-31 NOTE — TOC Progression Note (Signed)
Transition of Care [facility]) - Progression Note    Patient Details  Name: Hector Miller MRN: [id number] Date of Birth: Jun 23, 1945  Transition of Care [facility]) CM/SW Contact  Marlowe Sax, RN Phone Number: 03/31/2022, 9:39 AM  Clinical Narrative:    TOC to continue to assist with DC planning, [facility]   Expected Discharge Plan: Skilled Nursing Facility Barriers to Discharge: Insurance Authorization  Expected Discharge Plan and Services Expected Discharge Plan: Skilled Nursing Facility                                               Social Determinants of Health (SDOH) Interventions    Readmission Risk Interventions    05/16/2021   11:08 AM  Readmission Risk Prevention Plan  Post Dischage Appt Complete  Medication Screening Complete  Transportation Screening Complete

## 2022-03-31 NOTE — Progress Notes (Signed)
PROGRESS NOTE    Hector Miller   ZOX:[id number] DOB: 09/17/1945  DOA: 03/25/2022 Date of Service: 03/31/22 [facility]     Brief Narrative / Hospital Course:  Hector Miller is a 76 y.o. male with a PMH significant for coronary artery disease, diabetes mellitus, hypertension, dyslipidemia, alcohol dependence. [facility] to the ED on 03/25/2022 with fall out of bed and unable to stand from ground. He was on the floor overnight.  In the ED, it was found that they had tachycardia to 124, tachypnea to 22, and was normotensive and stable ORA. Afebrile. Significant findings included troponin of 101, serum glucose of 325, bicarbonate level of 11 with an anion gap of 25, lactic acid 8.9, white count of 13.6. urinalysis glucose >500, hgb large but negative for RBC (likely from elevated CK in prolonged time down), not indicative of infection.  Head and neck CT were negative for acute changes. Was  initially treated for severe sepsis with unknown source with 3 L of IV fluids in the ER as well as a dose of cefepime, Flagyl and vancomycin. He also received 10 units of NovoLog. Admitted to medicine service for further workup and management of severe sepsis from unknown source  11/12- without a clear source of infection and lacking typical signs of infection, discontinued the sepsis course started on presentation. Patient's main complaint is his lower back pain and states he can hardly move or feel his legs. Negative for bowel or bladder incontinence. He is having urine retention. T12/L1 Fracture (see CT results). Neurosurgery consulted. Urine retention NOT d/t spinal issue.  03/29/22 - PT/OT evaluation. SNF placement. Neurosurgery recs continue PT/OT, brace on when OOB, follow as outpatient. Urology advise continue foley and treat BPH, follow outpatient.  11/15-11/16: awaiting SN[place]. [place] [facility]   Consultants:  Neurosurgery Urology    Procedures: none      ASSESSMENT & PLAN:   Principal Problem:   SIRS due to infectious process with acute organ dysfunction (HCC) Active Problems:   Type 2 diabetes mellitus with hyperglycemia (HCC)   AKI (acute kidney injury) (HCC)   Elevated troponin   Alcohol abuse   Depression   HTN (hypertension)   Gastroesophageal reflux disease without esophagitis   Coronary artery disease   Traumatic rhabdomyolysis (HCC)   High anion gap metabolic acidosis   Sepsis (HCC)   Alcohol withdrawal syndrome without complication (HCC)   Dehydration   Acute urinary retention   Acute low back pain with sciatica   Fx lumbar vertebra-closed (HCC)   Radiculopathy   SIRS (HCC) criteria on presentation. Generalized weakness SIRS/Sepsis criteria have resolved.  No infectious source on chest xray, skin, or urinalysis.  Alert and oriented to baseline so do not suspect CNS infection.  Suspect multifactorial in nature for initial presentation of fall and weakness including likely viral infection, acidosis in relation to DM, and possibly intoxication, likely SIRS rather than Sepsis.  CK levels increased while admitted and patient was on statin medication which was discontinued due to possible statin induced myositis. electrolyte monitoring and replacement PRN discontinued IV Abx with neg cultures and no systemic signs of infection.  PT/OT discontinued statin therapy   Lower back pain- acute on chronic.  Lumbar MRI shows significant degenerative changes in lumbar spine including stenosis of canal and foramen and fracture of T12. CT thoracic spine to further evaluate the fracture shows that patient actually has extra vertebrae T12/L1 and 6 lumbar.  Analgesia PRN: heating pad, tylenol PRN, voltaren gel encourage OOB PT/OT neurosurgery following -->  continue work w/ PT/OT OOB as able w/ brace will follow outpatient    Acute urinary retention-Foley placed. Good UOP.  Per neurosurgery, not  related to the changes seen on spine images urology consulted, Dr. Lonna Cobb -->  continue tamsulosin and finasteride.  continue foley 7-10 days w/ office follow up with Dr Lonna Cobb for removal / voiding trial If he has persistent retention and no sensation of bladder fullness, will plan for urodynamic study outpatient    Type 2 diabetes mellitus with hyperglycemia (HCC)- A1c 6.8. glucose >500 in urinalysis. Positive anion gap on presentation now closed. Non-insulin dependent at baseline. Glycemic control with sliding scale insulin Maintain consistent carbohydrate diet   AKI- prerenal.  Resolved to normal baseline with hydration.   Elevated troponin  CAD- denies chest pain.  Continue Crestor  low-dose aspirin continue low-dose beta-blockers   Alcohol dependence- denies h/o withdrawals. Denies current tremors continue thiamine/folic acid continue CIWA protocol   Depression Continue Remeron and Lexapro   Traumatic rhabdomyolysis (HCC)-improving Patient is status post fall with elevated CK levels monitor CK levels IV fluids completed   Gastroesophageal reflux disease without esophagitis Continue PPI    DVT prophylaxis: SCD Pertinent IV fluids/nutrition: none Central lines / invasive devices: Foley catheter   Code Status: DNR Family Communication: none at this time   Disposition: inpatient pending SNF placement / auth  TOC needs: SNF  Barriers to discharge / significant pending items: SNF              Subjective:  Patient reports no concerns, back pain is still present but stable, no cp/sob, no ha/vc, tolerating diet.        Objective:  Vitals:   03/30/22 1708 03/30/22 2110 03/31/22 0131 03/31/22 0810  BP: 108/89 120/68 119/66 122/76  Pulse: 96 86 78 91  Resp:   18   Temp: 98 F (36.7 C)  97.9 F (36.6 C) 97.6 F (36.4 C)  TempSrc:      SpO2: 91%  95% 95%  Weight:      Height:        Intake/Output Summary (Last 24 hours) at 03/31/2022  1341 Last data filed at 03/31/2022 6314 Gross per 24 hour  Intake 240 ml  Output 1800 ml  Net -1560 ml    Filed Weights   03/25/22 1157 03/26/22 1051  Weight: 102.2 kg 95.3 kg    Examination: Constitutional:  VS as above General Appearance: alert, well-developed, well-nourished, NAD Respiratory: Normal respiratory effort CTA Cardiovascular: S1/S2 normal RRR Gastrointestinal: No tenderness Neurological: No cranial nerve deficit on limited exam Alert Psychiatric: Normal judgment/insight Normal mood and affect       Scheduled Medications:   aspirin  81 mg Oral Daily   Chlorhexidine Gluconate Cloth  6 each Topical Daily   cholecalciferol  1,000 Units Oral Daily   enoxaparin (LOVENOX) injection  40 mg Subcutaneous Q24H   finasteride  5 mg Oral Daily   folic acid  1 mg Oral Daily   gabapentin  300 mg Oral TID   insulin aspart  0-15 Units Subcutaneous TID WC   methocarbamol  500 mg Oral TID   metoprolol tartrate  12.5 mg Oral BID   mirtazapine  30 mg Oral QHS   pantoprazole  40 mg Oral Daily   polyethylene glycol  17 g Oral Daily   tamsulosin  0.8 mg Oral Daily   thiamine  100 mg Oral  Daily    Continuous Infusions:   PRN Medications:  acetaminophen **OR** acetaminophen, albuterol, HYDROmorphone (DILAUDID) injection, naphazoline-glycerin, ondansetron **OR** ondansetron (ZOFRAN) IV, oxyCODONE, traZODone  Antimicrobials:  Anti-infectives (From admission, onward)    Start     Dose/Rate Route Frequency Ordered Stop   03/27/22 2200  Ampicillin-Sulbactam (UNASYN) 3 g in sodium chloride 0.9 % 100 mL IVPB  Status:  Discontinued        3 g 200 mL/hr over 30 Minutes Intravenous Every 6 hours 03/27/22 1536 03/28/22 0937   03/26/22 2200  ceFEPIme (MAXIPIME) 2 g in sodium chloride 0.9 % 100 mL IVPB  Status:  Discontinued        2 g 200 mL/hr over 30 Minutes Intravenous Every 8 hours 03/26/22 1002 03/27/22 1536   03/26/22 1200  vancomycin (VANCOCIN) IVPB 1000 mg/200  mL premix  Status:  Discontinued        1,000 mg 200 mL/hr over 60 Minutes Intravenous Every 12 hours 03/26/22 1002 03/27/22 1536   03/25/22 2230  metroNIDAZOLE (FLAGYL) IVPB 500 mg  Status:  Discontinued        500 mg 100 mL/hr over 60 Minutes Intravenous Every 12 hours 03/25/22 1132 03/27/22 1536   03/25/22 2200  ceFEPIme (MAXIPIME) 2 g in sodium chloride 0.9 % 100 mL IVPB  Status:  Discontinued        2 g 200 mL/hr over 30 Minutes Intravenous Every 12 hours 03/25/22 1432 03/26/22 1002   03/25/22 2000  vancomycin (VANCOREADY) IVPB 1500 mg/300 mL  Status:  Discontinued        1,500 mg 150 mL/hr over 120 Minutes Intravenous Every 24 hours 03/25/22 1432 03/26/22 1002   03/25/22 1145  ceFEPIme (MAXIPIME) 2 g in sodium chloride 0.9 % 100 mL IVPB  Status:  Discontinued        2 g 200 mL/hr over 30 Minutes Intravenous  Once 03/25/22 1132 03/25/22 1133   03/25/22 1145  vancomycin (VANCOCIN) IVPB 1000 mg/200 mL premix  Status:  Discontinued        1,000 mg 200 mL/hr over 60 Minutes Intravenous  Once 03/25/22 1132 03/25/22 1133   03/25/22 0945  ceFEPIme (MAXIPIME) 2 g in sodium chloride 0.9 % 100 mL IVPB        2 g 200 mL/hr over 30 Minutes Intravenous  Once 03/25/22 0939 03/25/22 1037   03/25/22 0945  metroNIDAZOLE (FLAGYL) IVPB 500 mg        500 mg 100 mL/hr over 60 Minutes Intravenous  Once 03/25/22 0939 03/25/22 1125   03/25/22 0945  vancomycin (VANCOCIN) IVPB 1000 mg/200 mL premix        1,000 mg 200 mL/hr over 60 Minutes Intravenous  Once 03/25/22 0939 03/25/22 1219       Data Reviewed: I have personally reviewed following labs and imaging studies  CBC: Recent Labs  Lab 03/25/22 1826 03/26/22 0500 03/26/22 0644 03/27/22 0507 03/28/22 0518 03/29/22 0531 03/30/22 0425  WBC 9.5   < > 8.6 6.0 6.1 5.9 6.0  NEUTROABS 8.2*  --  6.8  --   --   --   --   HGB 14.0   < > 14.3 11.7* 12.7* 12.2* 12.5*  HCT 40.9   < > 41.5 33.7* 36.7* 36.1* 36.5*  MCV 100.2*   < > 102.0* 100.3* 100.0  101.7* 102.2*  PLT 83*   < > 99* 66* 80* 92* 101*   < > = values in this interval not displayed.    Basic  Metabolic Panel: Recent Labs  Lab 03/25/22 1020 03/25/22 1826 03/26/22 0500 03/26/22 0644 03/27/22 0507 03/28/22 0518 03/29/22 0531 03/30/22 0425  NA  --  136 138  --  134* 137 137 139  K  --  4.0 3.6  --  3.4* 3.4* 4.1 4.2  CL  --  103 113*  --  108 105 108 109  CO2  --  21* 16*  --  20* 24 21* 24  GLUCOSE  --  182* 87  --  140* 186* 172* 200*  BUN  --  15 9  --  CREATININE  --  1.11 0.56*  --  0.80 0.84 0.73 0.77  CALCIUM  --  8.6* 6.7*  --  7.6* 8.5* 8.1* 8.6*  MG 2.8* 2.2  --  2.3  --   --   --   --   PHOS  --   --   --  2.5  --   --   --   --     GFR: Estimated Creatinine Clearance: 92.6 mL/min (by C-G formula based on SCr of 0.77 mg/dL). Liver Function Tests: Recent Labs  Lab 03/25/22 0835 03/25/22 1826 03/26/22 0644 03/28/22 0518 03/29/22 0531  AST 85* 148* 313* 116* 77*  ALT 39 41 74* 51* 44  ALKPHOS 83 57 72 60 61  BILITOT 2.2* 1.7* 2.3* 1.2 0.7  PROT 8.3* 6.5 7.8 6.0* 5.5*  ALBUMIN 4.5 3.3* 4.2 2.8* 2.5*    No results for input(s): "LIPASE", "AMYLASE" in the last 168 hours. Recent Labs  Lab 03/26/22 0644  AMMONIA <10    Coagulation Profile: Recent Labs  Lab 03/26/22 0500  INR 1.7*    Cardiac Enzymes: Recent Labs  Lab 03/25/22 0835 03/27/22 1307 03/28/22 0514 03/29/22 0531 03/30/22 0425  CKTOTAL 782* 5,777* 3,058* 917* 414*    BNP (last 3 results) No results for input(s): "PROBNP" in the last 8760 hours. HbA1C: No results for input(s): "HGBA1C" in the last 72 hours. CBG: Recent Labs  Lab 03/30/22 0845 03/30/22 1144 03/30/22 1610 03/31/22 0731 03/31/22 1141  GLUCAP 196* 198* 220* 168* 157*    Lipid Profile: No results for input(s): "CHOL", "HDL", "LDLCALC", "TRIG", "CHOLHDL", "LDLDIRECT" in the last 72 hours. Thyroid Function Tests: No results for input(s): "TSH", "T4TOTAL", "FREET4", "T3FREE",  "THYROIDAB" in the last 72 hours. Anemia Panel: No results for input(s): "VITAMINB12", "FOLATE", "FERRITIN", "TIBC", "IRON", "RETICCTPCT" in the last 72 hours. Urine analysis:    Component Value Date/Time   COLORURINE YELLOW (A) 03/25/2022 1827   APPEARANCEUR CLOUDY (A) 03/25/2022 1827   LABSPEC 1.017 03/25/2022 1827   PHURINE 5.0 03/25/2022 1827   GLUCOSEU >=500 (A) 03/25/2022 1827   HGBUR LARGE (A) 03/25/2022 1827   BILIRUBINUR NEGATIVE 03/25/2022 1827   KETONESUR 20 (A) 03/25/2022 1827   PROTEINUR 100 (A) 03/25/2022 1827   UROBILINOGEN 0.2 03/26/2013 1219   NITRITE NEGATIVE 03/25/2022 1827   LEUKOCYTESUR NEGATIVE 03/25/2022 1827   Sepsis Labs: (procalcitonin:4,lacticidven:4)  Recent Results (from the past 240 hour(s))  Blood culture (routine x 2)     Status: None   Collection Time: 03/25/22  8:34 AM   Specimen: Right Antecubital; Blood  Result Value Ref Range Status   Specimen Description RIGHT ANTECUBITAL  Final   Special Requests   Final    BOTTLES DRAWN AEROBIC AND ANAEROBIC Blood Culture results may not be optimal due to an inadequate volume of blood received in culture bottles   Culture  Final    NO GROWTH 5 DAYS Performed at [facility], [address] [place], [place] [address]    Report Status 03/30/2022 FINAL  Final  Resp Panel by RT-PCR (Flu A&B, Covid) Anterior Nasal Swab     Status: None   Collection Time: 03/25/22  8:35 AM   Specimen: Anterior Nasal Swab  Result Value Ref Range Status   SARS Coronavirus 2 by RT PCR NEGATIVE NEGATIVE Final    Comment: (NOTE) SARS-CoV-2 target nucleic acids are NOT DETECTED.  The SARS-CoV-2 RNA is generally detectable in upper respiratory specimens during the acute phase of infection. The lowest concentration of SARS-CoV-2 viral copies this assay can detect is 138 copies/mL. A negative result does not preclude SARS-Cov-2 infection and should not be used as the sole basis for treatment or other  patient management decisions. A negative result may occur with  improper specimen collection/handling, submission of specimen other than nasopharyngeal swab, presence of viral mutation(s) within the areas targeted by this assay, and inadequate number of viral copies(<138 copies/mL). A negative result must be combined with clinical observations, patient history, and epidemiological information. The expected result is Negative.  Fact Sheet for Patients:  BloggerCourse.com  Fact Sheet for Healthcare Providers:  SeriousBroker.it  This test is no t yet approved or cleared by the [place] FDA and  has been authorized for detection and/or diagnosis of SARS-CoV-2 by FDA under an Emergency Use Authorization (EUA). This EUA will remain  in effect (meaning this test can be used) for the duration of the COVID-19 declaration under Section 564(b)(1) of the Act, 21 U.S.C.section 360bbb-3(b)(1), unless the authorization is terminated  or revoked sooner.       Influenza A by PCR NEGATIVE NEGATIVE Final   Influenza B by PCR NEGATIVE NEGATIVE Final    Comment: (NOTE) The Xpert Xpress SARS-CoV-2/FLU/RSV plus assay is intended as an aid in the diagnosis of influenza from Nasopharyngeal swab specimens and should not be used as a sole basis for treatment. Nasal washings and aspirates are unacceptable for Xpert Xpress SARS-CoV-2/FLU/RSV testing.  Fact Sheet for Patients: BloggerCourse.com  Fact Sheet for Healthcare Providers: SeriousBroker.it  This test is not yet approved or cleared by the [place] FDA and has been authorized for detection and/or diagnosis of SARS-CoV-2 by FDA under an Emergency Use Authorization (EUA). This EUA will remain in effect (meaning this test can be used) for the duration of the COVID-19 declaration under Section 564(b)(1) of the Act, 21 U.S.C. section  360bbb-3(b)(1), unless the authorization is terminated or revoked.  Performed at [facility], [address] [place], [place] [address]   Blood culture (routine x 2)     Status: None   Collection Time: 03/25/22  8:47 AM   Specimen: Right Antecubital; Blood  Result Value Ref Range Status   Specimen Description RIGHT ANTECUBITAL  Final   Special Requests   Final    BOTTLES DRAWN AEROBIC AND ANAEROBIC Blood Culture adequate volume   Culture   Final    NO GROWTH 5 DAYS Performed at [facility], [address], [place], [place] [address]    Report Status 03/30/2022 FINAL  Final         Radiology Studies: CT HEAD WO CONTRAST ( )  Result Date: 03/25/2022 CLINICAL DATA:  Neuro deficit EXAM: CT HEAD WITHOUT CONTRAST TECHNIQUE: Contiguous axial images were obtained from the base of the skull through the vertex without intravenous contrast. RADIATION DOSE REDUCTION: This exam was performed according to  the departmental dose-optimization program which includes automated exposure control, adjustment of the mA and/or kV according to patient size and/or use of iterative reconstruction technique. COMPARISON:  CT brain 03/25/2022 FINDINGS: Brain: No acute territorial infarction, hemorrhage or intracranial mass. Atrophy and mild chronic small vessel ischemic changes of the white matter. Stable ventricle size. Chronic lacunar infarct versus dilated perivascular space in the right posterior sub insula. Small chronic right cerebellar infarcts. Vascular: No hyperdense vessels.  Carotid vascular calcification. Skull: Normal. Negative for fracture or focal lesion. Sinuses/Orbits: Mild mucosal thickening in the sinuses Other: None IMPRESSION: 1. No CT evidence for acute intracranial abnormality. 2. Atrophy and chronic small vessel ischemic changes of the white matter. Electronically Signed   By: Jasmine Pang M.D.   On: 03/25/2022 18:22   CT Cervical Spine Wo Contrast  Result Date:  03/25/2022 CLINICAL DATA:  Neck trauma. EXAM: CT CERVICAL SPINE WITHOUT CONTRAST TECHNIQUE: Multidetector CT imaging of the cervical spine was performed without intravenous contrast. Multiplanar CT image reconstructions were also generated. RADIATION DOSE REDUCTION: This exam was performed according to the departmental dose-optimization program which includes automated exposure control, adjustment of the mA and/or kV according to patient size and/or use of iterative reconstruction technique. COMPARISON:  January 19, 2022 FINDINGS: Alignment: Normal. Skull base and vertebrae: No acute fracture. No primary bone lesion or focal pathologic process. Soft tissues and spinal canal: No prevertebral fluid or swelling. No visible canal hematoma. Disc levels: Mild multilevel osteoarthritic changes. Right facet arthropathy at C3-C4 with right neural foraminal stenosis. Upper chest: Negative. Other: None. IMPRESSION: 1. No acute fracture or subluxation of the cervical spine. 2. Mild multilevel osteoarthritic changes. Right facet arthropathy at C3-C4 with right neural foraminal stenosis. Electronically Signed   By: Ted Mcalpine M.D.   On: 03/25/2022 09:26   CT HEAD WO CONTRAST ( )  Result Date: 03/25/2022 CLINICAL DATA:  Head trauma. EXAM: CT HEAD WITHOUT CONTRAST TECHNIQUE: Contiguous axial images were obtained from the base of the skull through the vertex without intravenous contrast. RADIATION DOSE REDUCTION: This exam was performed according to the departmental dose-optimization program which includes automated exposure control, adjustment of the mA and/or kV according to patient size and/or use of iterative reconstruction technique. COMPARISON:  January 19, 2022 FINDINGS: Brain: No evidence of acute infarction, hemorrhage, hydrocephalus, extra-axial collection or mass lesion/mass effect. Marked brain parenchymal volume loss and moderate deep white matter microangiopathy. Vascular: No hyperdense vessel or  unexpected calcification. Skull: Normal. Negative for fracture or focal lesion. Sinuses/Orbits: No acute finding. Other: None. IMPRESSION: 1. No acute intracranial abnormality. 2. Marked brain parenchymal volume loss and moderate deep white matter microangiopathy. Electronically Signed   By: Ted Mcalpine M.D.   On: 03/25/2022 09:23   DG Chest Portable 1 View  Result Date: 03/25/2022 CLINICAL DATA:  Sepsis. EXAM: PORTABLE CHEST 1 VIEW COMPARISON:  May 13, 2021. FINDINGS: The heart size and mediastinal contours are within normal limits. Sternotomy wires are noted. Both lungs are clear. The visualized skeletal structures are unremarkable. IMPRESSION: No active disease. Electronically Signed   By: Lupita Raider M.D.   On: 03/25/2022 09:14            LOS: 6 days    Time spent: n/a    Sunnie Nielsen, DO [facility] 03/31/2022, 1:41 PM   Staff may message me via secure chat in Epic  but this may not receive immediate response,  please page for urgent matters!  If 7PM-7AM, please contact night-coverage [email or web address]  Dictation  software was used to generate the above note. Typos may occur and escape review, as with typed/written notes. Please contact Dr Lyn Hollingshead directly for clarity if needed.

## 2022-03-31 NOTE — TOC Progression Note (Signed)
Transition of Care [facility]) - Progression Note    Patient Details  Name: Hector Miller MRN: [id number] Date of Birth: [birth year]  Transition of [facility]) CM/SW Contact  Marlowe Sax, RN Phone Number: 03/31/2022, 12:26 PM  Clinical Narrative:    Secure [facility] to [facility] Sato Anticipate that they will review today   Expected Discharge Plan: Skilled Nursing Facility Barriers to Discharge: Insurance Authorization  Expected Discharge Plan and Services Expected Discharge Plan: Skilled Nursing Facility                                               Social Determinants of Health (SDOH) Interventions    Readmission Risk Interventions    05/16/2021   11:08 AM  Readmission Risk Prevention Plan  Post Dischage Appt Complete  Medication Screening Complete  Transportation Screening Complete

## 2022-03-31 NOTE — Plan of Care (Signed)

## 2022-03-31 NOTE — Progress Notes (Signed)
Physical Therapy Treatment Patient Details Name: Hector Miller MRN: [id number] DOB: 1945-07-03 Today's Date: 03/31/2022   History of Present Illness Pt admitted to [facility] on 03/25/22 for syncopal event and mechanical fall; unable to stand from the ground and was on the floor all night. Found to be hyperglycemic, tachycardic, and tachypneic, upon EMS arrival. Dx with severe sepsis. Elevated troponin levels secondary to demand ischemia from tachycardia. Significant PMH includes: CAD s/p CABG, DM, HTN, HLD, and alcohol abuse. Imaging negative for acute abnormalities.    PT Comments    Pt premedicated prior to session. Agreeable to limited in room ambulation secondary to pain. LSO donned and cues for technique with bed mobility and transfers. Improved functional independence and ability to transfer from lower surface. Still requires mod A and RW. Will continue to progress.   Recommendations for follow up therapy are one component of a multi-disciplinary discharge planning process, led by the attending physician.  Recommendations may be updated based on patient status, additional functional criteria and insurance authorization.  Follow Up Recommendations  Skilled nursing-short term rehab (<3 hours/day) Can patient physically be transported by private vehicle: Yes   Assistance Recommended at Discharge Intermittent Supervision/Assistance  Patient can return home with the following A lot of help with walking and/or transfers;Assist for transportation;Help with stairs or ramp for entrance   Equipment Recommendations   (TBD)    Recommendations for Other Services       Precautions / Restrictions Precautions Precautions: Fall Restrictions Weight Bearing Restrictions: No     Mobility  Bed Mobility Overal bed mobility: Needs Assistance Bed Mobility: Supine to Sit     Supine to sit: Min guard     General bed mobility comments: improved technique with ability to sit at EOB with less  physical assist, however takes extended time    Transfers Overall transfer level: Needs assistance Equipment used: Rolling walker (2 wheels) Transfers: Sit to/from Stand Sit to Stand: Mod assist           General transfer comment: needed mod assist. Able to stand from slightly lower surface this date.. Pillow placed in recliner prior to transfer    Ambulation/Gait Ambulation/Gait assistance: Min assist Gait Distance (Feet): 40 Feet Assistive device: Rolling walker (2 wheels) Gait Pattern/deviations: Step-through pattern       General Gait Details: ambulated short distance in room. Has difficulty navigating obstacles. RW used   Social research officer, government Rankin (Stroke Patients Only)       Balance Overall balance assessment: Needs assistance Sitting-balance support: Bilateral upper extremity supported Sitting balance-Leahy Scale: Good     Standing balance support: Bilateral upper extremity supported, Reliant on assistive device for balance, During functional activity Standing balance-Leahy Scale: Fair                              Cognition Arousal/Alertness: Awake/alert Behavior During Therapy: WFL for tasks assessed/performed Overall Cognitive Status: Within Functional Limits for tasks assessed                                          Exercises      General Comments        Pertinent Vitals/Pain Pain Assessment Pain Assessment: 0-10 Pain Score: 9  Pain Location: R side  low back Pain Descriptors / Indicators: Aching, Sharp Pain Intervention(s): Limited activity within patient's tolerance, Premedicated before session    Home Living                          Prior Function            PT Goals (current goals can now be found in the care plan section) Acute Rehab PT Goals Patient Stated Goal: I want to try to walk PT Goal Formulation: With patient Time For Goal Achievement:  04/10/22 Potential to Achieve Goals: Fair Progress towards PT goals: Progressing toward goals    Frequency    Min 2X/week      PT Plan Current plan remains appropriate    Co-evaluation              AM-PAC PT "6 Clicks" Mobility   Outcome Measure  Help needed turning from your back to your side while in a flat bed without using bedrails?: A Little Help needed moving from lying on your back to sitting on the side of a flat bed without using bedrails?: A Little Help needed moving to and from a bed to a chair (including a wheelchair)?: A Little Help needed standing up from a chair using your arms (e.g., wheelchair or bedside chair)?: A Lot Help needed to walk in hospital room?: A Little Help needed climbing 3-5 steps with a railing? : Total 6 Click Score: 15    End of Session   Activity Tolerance: Patient limited by pain Patient left: in chair;with chair alarm set Nurse Communication: Mobility status PT Visit Diagnosis: Unsteadiness on feet (R26.81);History of falling (Z91.81);Difficulty in walking, not elsewhere classified (R26.2);Muscle weakness (generalized) (M62.81);Pain Pain - Right/Left:  (back) Pain - part of body:  (back)     Time: 4403-4742 PT Time Calculation (min) (ACUTE ONLY): 13 min  Charges:  $Gait Training: 8-22 mins                     Elizabeth Palau, PT, DPT, GCS [phone]    Hector Miller 03/31/2022, 9:39 AM

## 2022-04-01 DIAGNOSIS — R6511 Systemic inflammatory response syndrome (SIRS) of non-infectious origin with acute organ dysfunction: Secondary | ICD-10-CM | POA: Diagnosis not present

## 2022-04-01 LAB — CREATININE, SERUM
Creatinine, Ser: 0.82 mg/dL (ref 0.61–1.24)
GFR, Estimated: >60 mL/min (ref >60)

## 2022-04-01 LAB — GLUCOSE, CAPILLARY
Glucose-Capillary: 157 mg/dL — ABNORMAL HIGH (ref 70–99)
Glucose-Capillary: 149 mg/dL — ABNORMAL HIGH (ref 70–99)

## 2022-04-01 MED ORDER — TAMSULOSIN HCL 0.4 MG PO CAPS: 0.8000 mg | ORAL_CAPSULE | Freq: Every day | ORAL | 0 refills | Status: AC

## 2022-04-01 MED ORDER — METHOCARBAMOL 500 MG PO TABS: 500.0000 mg | ORAL_TABLET | Freq: Three times a day (TID) | ORAL | 0 refills | Status: DC | PRN

## 2022-04-01 MED ORDER — ASPIRIN 81 MG PO CHEW: 81.0000 mg | CHEWABLE_TABLET | Freq: Every day | ORAL | 0 refills | Status: AC

## 2022-04-01 MED ORDER — METOPROLOL TARTRATE 25 MG PO TABS: 12.5000 mg | ORAL_TABLET | Freq: Two times a day (BID) | ORAL | 0 refills | Status: DC

## 2022-04-01 MED ORDER — POLYETHYLENE GLYCOL 3350 17 G PO PACK: 17.0000 g | PACK | Freq: Every day | ORAL | 0 refills | Status: AC | PRN

## 2022-04-01 MED ORDER — ACETAMINOPHEN 325 MG PO TABS: 650.0000 mg | ORAL_TABLET | Freq: Four times a day (QID) | ORAL | Status: AC | PRN

## 2022-04-01 MED ORDER — OXYCODONE HCL 5 MG PO TABS: 5.0000 mg | ORAL_TABLET | Freq: Four times a day (QID) | ORAL | 0 refills | Status: DC | PRN

## 2022-04-01 NOTE — Plan of Care (Signed)

## 2022-04-01 NOTE — Progress Notes (Signed)
EMS here to transport pt. 

## 2022-04-01 NOTE — TOC Progression Note (Signed)
Transition of Care [facility]) - Progression Note    Patient Details  Name: Hector Miller MRN: [id number] Date of Birth: [birth year]  [facility] CM/SW Contact  Marlowe Sax, RN Phone Number: 04/01/2022, 8:39 AM  Clinical Narrative:   received email from Leanna Sato at the [facility] stating  [facility] [phone] is authorized for up to 60 days of short term rehab at [facility] and I have alerted them to this  I sent this information to Dorene Ar at [facility]  Expected Discharge Plan: Skilled Nursing Facility Barriers to Discharge: Insurance Authorization  Expected Discharge Plan and Services Expected Discharge Plan: Skilled Nursing Facility                                               Social Determinants of Health (SDOH) Interventions    Readmission Risk Interventions    05/16/2021   11:08 AM  Readmission Risk Prevention Plan  Post Dischage Appt Complete  Medication Screening Complete  Transportation Screening Complete

## 2022-04-01 NOTE — Progress Notes (Signed)
Occupational Therapy Treatment Patient Details Name: Hector Miller MRN: [id number] DOB: 06-14-1945 Today's Date: 04/01/2022   History of present illness Pt admitted to [facility] on 03/25/22 for syncopal event and mechanical fall; unable to stand from the ground and was on the floor all night. Found to be hyperglycemic, tachycardic, and tachypneic, upon EMS arrival. Dx with severe sepsis. Elevated troponin levels secondary to demand ischemia from tachycardia. Significant PMH includes: CAD s/p CABG, DM, HTN, HLD, and alcohol abuse. Imaging negative for acute abnormalities.   OT comments  Patient supine in bed upon arrival, agreeable to OT treatment. Patient was able to perform bed mobility with min guard, HOB raised. Patient was able to transfer sit<> stand x2 reps, initially patient unable to clear buttocks from bed, progressed to standing with mod A +2. Patient very unsteady initially with standing, able to self correct with min A. Patient was able to ambulate ~40 ft in room to the bathroom with min A. Transferred to/from toilet with min A. Patient left sitting EOB with nurse tech present and all needs met.    Recommendations for follow up therapy are one component of a multi-disciplinary discharge planning process, led by the attending physician.  Recommendations may be updated based on patient status, additional functional criteria and insurance authorization.    Follow Up Recommendations  Skilled nursing-short term rehab (<3 hours/day)     Assistance Recommended at Discharge Frequent or constant Supervision/Assistance  Patient can return home with the following  A lot of help with walking and/or transfers;A little help with bathing/dressing/bathroom;Help with stairs or ramp for entrance;Assist for transportation;Assistance with cooking/housework   Equipment Recommendations  Other (comment) (Defer to next venue of care)       Precautions / Restrictions Precautions Precautions:  Fall Restrictions Weight Bearing Restrictions: No       Mobility Bed Mobility Overal bed mobility: Needs Assistance Bed Mobility: Supine to Sit     Supine to sit: Min guard, HOB elevated          Transfers Overall transfer level: Needs assistance Equipment used: Rolling walker (2 wheels) Transfers: Sit to/from Stand Sit to Stand: Mod assist                 Balance Overall balance assessment: Needs assistance Sitting-balance support: Bilateral upper extremity supported Sitting balance-Leahy Scale: Good     Standing balance support: Bilateral upper extremity supported, Reliant on assistive device for balance, During functional activity Standing balance-Leahy Scale: Fair Standing balance comment: no LOB observed, patient appears unsteady upon standing, able to self correct                           ADL either performed or assessed with clinical judgement   ADL                           Toilet Transfer: Minimal assistance                  Extremity/Trunk Assessment Upper Extremity Assessment Upper Extremity Assessment: Generalized weakness   Lower Extremity Assessment Lower Extremity Assessment: Generalized weakness        Vision Patient Visual Report: No change from baseline            Cognition Arousal/Alertness: Awake/alert Behavior During Therapy: WFL for tasks assessed/performed Overall Cognitive Status: Within Functional Limits for tasks assessed  Pertinent Vitals/ Pain       Pain Assessment Pain Assessment: No/denies pain   Frequency  Min 2X/week        Progress Toward Goals  OT Goals(current goals can now be found in the care plan section)     Acute Rehab OT Goals Patient Stated Goal: get better OT Goal Formulation: With patient Time For Goal Achievement: 04/10/22 Potential to Achieve Goals: Fair   AM-PAC OT "6 Clicks"  Daily Activity     Outcome Measure   Help from another person eating meals?: A Little Help from another person taking care of personal grooming?: A Little Help from another person toileting, which includes using toliet, bedpan, or urinal?: A Lot Help from another person bathing (including washing, rinsing, drying)?: A Lot Help from another person to put on and taking off regular upper body clothing?: A Little Help from another person to put on and taking off regular lower body clothing?: A Lot 6 Click Score: 15    End of Session Equipment Utilized During Treatment: Rolling walker (2 wheels);Other (comment) (LSO brace)  OT Visit Diagnosis: Unsteadiness on feet (R26.81);History of falling (Z91.81);Muscle weakness (generalized) (M62.81);Pain   Activity Tolerance Patient tolerated treatment well   Patient Left in bed;with nursing/sitter in room   Nurse Communication Mobility status        Time: 8375-4237 OT Time Calculation (min): 17 min  Charges: OT General Charges $OT Visit: 1 Visit OT Treatments $Self Care/Home Management : 8-22 mins    [name], OTS 04/01/2022, 10:56 AM

## 2022-04-01 NOTE — Plan of Care (Signed)
  Problem: Activity: Goal: Risk for activity intolerance will decrease Outcome: Progressing   Problem: Nutrition: Goal: Adequate nutrition will be maintained Outcome: Progressing   Problem: Pain Managment: Goal: General experience of comfort will improve Outcome: Progressing   Problem: Safety: Goal: Ability to remain free from injury will improve Outcome: Progressing   

## 2022-04-01 NOTE — Discharge Summary (Signed)
Physician Discharge Summary   Patient: Hector Miller MRN: [id number]  DOB: 09/04/1945   Admit:     Date of Admission: 03/25/2022 Ad[facility]: [facility]    Discharge: Date of discharge: 04/01/22 Disposition: Skilled nursing facility Condition at discharge: good  CODE STATUS: DNR - confirmed today w/ patient prior to discharge     Discharge Physician: Sunnie Nielsen, DO [facility]     PCP: [facility]  Recommendations for Outpatient Follow-up:  Follow up with PCP [facility], [facility] in 2-4 weeks Follow w/ urology Dr Lonna Cobb in 1 week  Follow w/ neurosurgery Dr Myer Haff in 4-6 weeks Please obtain labs/tests: CBC, BMP, UA in 1-2 weeks Please follow up on the following pending results: none PCP AND OTHER OUTPATIENT PROVIDERS: SEE BELOW FOR SPECIFIC DISCHARGE INSTRUCTIONS PRINTED FOR PATIENT IN ADDITION TO GENERIC AVS PATIENT INFO    Discharge Instructions     Diet - low sodium heart healthy   Complete by: As directed    Diet Carb Modified   Complete by: As directed    Increase activity slowly   Complete by: As directed          Discharge Diagnoses: Principal Problem:   SIRS of non-infectious origin w acute organ dysfunction (HCC) Active Problems:   Type 2 diabetes mellitus with hyperglycemia (HCC)   AKI (acute kidney injury) (HCC)   Elevated troponin   Alcohol abuse   Depression   HTN (hypertension)   Gastroesophageal reflux disease without esophagitis   Coronary artery disease   Traumatic rhabdomyolysis (HCC)   High anion gap metabolic acidosis   Sepsis (HCC)   Alcohol withdrawal syndrome without complication (HCC)   Dehydration   Acute urinary retention   Acute low back pain with sciatica   Fx lumbar vertebra-closed [facility])   Radiculopathy       Hospital Course: Hector Miller is a 76 y.o. male with a PMH significant for coronary artery disease, diabetes mellitus, hypertension,  dyslipidemia, alcohol dependence. [facility] to [facility] ED on 03/25/2022 with fall out of bed and unable to stand from ground. He was on the floor overnight.  In the ED, it was found that they had tachycardia to 124, tachypnea to 22, and was normotensive and stable ORA. Afebrile. Significant findings included troponin of 101, serum glucose of 325, bicarbonate level of 11 with an anion gap of 25, lactic acid 8.9, white count of 13.6. urinalysis glucose >500, hgb large but negative for RBC (likely from elevated CK in prolonged time down), not indicative of infection.  Head and neck CT were negative for acute changes. Was  initially treated for severe sepsis with unknown source with 3 L of IV fluids in the ER as well as a dose of cefepime, Flagyl and vancomycin. He also received 10 units of NovoLog. Admitted to medicine service for further workup and management of severe sepsis from unknown source  11/12- without a clear source of infection and lacking typical signs of infection, discontinued the sepsis course started on presentation. Patient's main complaint is his lower back pain and states he can hardly move or feel his legs. Negative for bowel or bladder incontinence. He is having urine retention. T12/L1 Fracture (see CT results). Neurosurgery consulted. Urine retention NOT d/t spinal issue.  03/29/22 - PT/OT evaluation. SNF placement. Neurosurgery recs continue PT/OT, brace on when OOB, follow as outpatient. Urology advise continue foley and treat BPH, follow outpatient.  11/15-11/16: awaiting SNF. needs auth through the [place]  11/17: SNF auth approved, stable for discharge   Consultants:  Neurosurgery Urology   Procedures: none      ASSESSMENT & PLAN:   Principal Problem:   SIRS due to infectious process with acute organ dysfunction (HCC) Active Problems:   Type 2 diabetes mellitus with hyperglycemia (HCC)   AKI (acute kidney injury) (HCC)   Elevated troponin    Alcohol abuse   Depression   HTN (hypertension)   Gastroesophageal reflux disease without esophagitis   Coronary artery disease   Traumatic rhabdomyolysis (HCC)   High anion gap metabolic acidosis   Sepsis (HCC)   Alcohol withdrawal syndrome without complication (HCC)   Dehydration   Acute urinary retention   Acute low back pain with sciatica   Fx lumbar vertebra-closed (HCC)   Radiculopathy   SIRS (HCC) criteria on presentation. Generalized weakness SIRS/Sepsis criteria have resolved.  No infectious source on chest xray, skin, or urinalysis.  Alert and oriented to baseline so do not suspect CNS infection.  Suspect multifactorial in nature for initial presentation of fall and weakness including likely viral infection, acidosis in relation to DM, and possibly intoxication, likely SIRS rather than Sepsis.  CK levels increased while admitted and patient was on statin medication which was discontinued due to possible statin induced myositis. electrolyte monitoring and replacement PRN discontinued IV Abx with neg cultures and no systemic signs of infection.  PT/OT discontinued statin therapy   Lower back pain- acute on chronic.  Lumbar MRI shows significant degenerative changes in lumbar spine including stenosis of canal and foramen and fracture of T12. CT thoracic spine to further evaluate the fracture shows that patient actually has extra vertebrae T12/L1 and 6 lumbar.  Analgesia PRN: heating pad, tylenol PRN, voltaren gel encourage OOB PT/OT neurosurgery following -->  continue work w/ PT/OT OOB as able w/ brace will follow outpatient    Acute urinary retention-Foley placed. Good UOP.  Per neurosurgery, not related to the changes seen on spine images urology consulted, Dr. Lonna Cobb -->  continue tamsulosin and finasteride.  continue foley 7-10 days w/ office follow up with Dr Lonna Cobb for removal / voiding trial If he has persistent retention and no sensation of bladder  fullness, will plan for urodynamic study outpatient    Type 2 diabetes mellitus with hyperglycemia (HCC)- A1c 6.8. glucose >500 in urinalysis. Positive anion gap on presentation now closed. Non-insulin dependent at baseline. Glycemic control with sliding scale insulin Maintain consistent carbohydrate diet   AKI- prerenal.  Resolved to normal baseline with hydration.   Elevated troponin  CAD- denies chest pain.  Continue Crestor  low-dose aspirin continue low-dose beta-blockers   Alcohol dependence- denies h/o withdrawals. Denies current tremors continue thiamine/folic acid continue CIWA protocol   Depression Continue Remeron and Lexapro   Traumatic rhabdomyolysis (HCC)-improving Patient is status post fall with elevated CK levels monitor CK levels IV fluids completed   Gastroesophageal reflux disease without esophagitis Continue PPI   Confirmed DNR w/ patient          Discharge Instructions  Allergies as of 04/01/2022       Reactions   Lisinopril Cough        Medication List     STOP taking these medications    allopurinol 300 MG tablet Commonly known as: ZYLOPRIM   aspirin 325 MG tablet Replaced by: aspirin 81 MG chewable tablet   furosemide 20 MG tablet Commonly known as: LASIX   rosuvastatin 20 MG  tablet Commonly known as: CRESTOR   traZODone 100 MG tablet Commonly known as: DESYREL       TAKE these medications    acetaminophen 325 MG tablet Commonly known as: TYLENOL Take 2 tablets (650 mg total) by mouth every 6 (six) hours as needed for mild pain, fever or headache (or Fever >/= 101).   albuterol 108 (90 Base) MCG/ACT inhaler Commonly known as: VENTOLIN HFA Inhale 2 puffs into the lungs every 6 (six) hours as needed for wheezing or shortness of breath.   aspirin 81 MG chewable tablet Chew 1 tablet (81 mg total) by mouth daily. Replaces: aspirin 325 MG tablet   benzonatate 100 MG capsule Commonly known as: Tessalon  Perles Take 2 capsules (200 mg total) by mouth 3 (three) times daily as needed for cough.   cholecalciferol 25 MCG (1000 UNIT) tablet Commonly known as: VITAMIN D3 Take 1,000 Units by mouth daily.   finasteride 5 MG tablet Commonly known as: PROSCAR Take 5 mg by mouth daily.   Fish Oil 1000 MG Caps Take 1,000 mg by mouth 2 (two) times daily.   folic acid 1 MG tablet Commonly known as: FOLVITE Take 1 mg by mouth daily.   gabapentin 300 MG capsule Commonly known as: NEURONTIN Take 300 mg by mouth 3 (three) times daily.   metFORMIN 500 MG tablet Commonly known as: GLUCOPHAGE Take 1,000 mg by mouth 2 (two) times daily with a meal.   methocarbamol 500 MG tablet Commonly known as: ROBAXIN Take 1 tablet (500 mg total) by mouth every 8 (eight) hours as needed for muscle spasms.   metoprolol tartrate 25 MG tablet Commonly known as: LOPRESSOR Take 0.5 tablets (12.5 mg total) by mouth 2 (two) times daily.   mirtazapine 30 MG tablet Commonly known as: REMERON Take 30 mg by mouth at bedtime.   oxyCODONE 5 MG immediate release tablet Commonly known as: Oxy IR/ROXICODONE Take 1-2 tablets (5-10 mg total) by mouth every 6 (six) hours as needed for moderate pain or severe pain.   pantoprazole 40 MG tablet Commonly known as: PROTONIX Take 40 mg by mouth daily.   polyethylene glycol 17 g packet Commonly known as: MIRALAX / GLYCOLAX Take 17 g by mouth daily as needed for mild constipation. What changed:  when to take this reasons to take this   tamsulosin 0.4 MG Caps capsule Commonly known as: FLOMAX Take 2 capsules (0.8 mg total) by mouth daily.   thiamine 100 MG tablet Commonly known as: Vitamin B-1 Take 100 mg by mouth daily.         Follow-up Information     Stoioff, Verna Czech, MD. Schedule an appointment as soon as possible for a visit.   Specialty: Urology Contact information: [address] [place] [address] [phone]                  Allergies  Allergen Reactions   Lisinopril Cough     Subjective: pt feeling okay today, persistent back pain about the same    Discharge Exam: BP 104/77 (BP Location: Right Arm)   Pulse 84   Temp 98.2 F (36.8 C)   Resp 16   Ht 6' (1.829 m)   Wt 95.3 kg   SpO2 93%   BMI 28.48 kg/m  General: Pt is alert, awake, not in acute distress Cardiovascular: RRR, S1/S2 +, no rubs, no gallops Respiratory: CTA bilaterally, no wheezing, no rhonchi Abdominal: Soft, NT, ND, bowel sounds + Extremities: no  edema, no cyanosis     The results of significant diagnostics from this hospitalization (including imaging, microbiology, ancillary and laboratory) are listed below for reference.     Microbiology: Recent Results (from the past 240 hour(s))  Blood culture (routine x 2)     Status: None   Collection Time: 03/25/22  8:34 AM   Specimen: Right Antecubital; Blood  Result Value Ref Range Status   Specimen Description RIGHT ANTECUBITAL  Final   Special Requests   Final    BOTTLES DRAWN AEROBIC AND ANAEROBIC Blood Culture results may not be optimal due to an inadequate volume of blood received in culture bottles   Culture   Final    NO GROWTH 5 DAYS Performed at [facility], [address] [place], [place] [address]    Report Status 03/30/2022 FINAL  Final  Resp Panel by RT-PCR (Flu A&B, Covid) Anterior Nasal Swab     Status: None   Collection Time: 03/25/22  8:35 AM   Specimen: Anterior Nasal Swab  Result Value Ref Range Status   SARS Coronavirus 2 by RT PCR NEGATIVE NEGATIVE Final    Comment: (NOTE) SARS-CoV-2 target nucleic acids are NOT DETECTED.  The SARS-CoV-2 RNA is generally detectable in upper respiratory specimens during the acute phase of infection. The lowest concentration of SARS-CoV-2 viral copies this assay can detect is 138 copies/mL. A negative result does not preclude SARS-Cov-2 infection and should not be used as the sole basis for treatment  or other patient management decisions. A negative result may occur with  improper specimen collection/handling, submission of specimen other than nasopharyngeal swab, presence of viral mutation(s) within the areas targeted by this assay, and inadequate number of viral copies(<138 copies/mL). A negative result must be combined with clinical observations, patient history, and epidemiological information. The expected result is Negative.  Fact Sheet for Patients:  BloggerCourse.comhttps://www.fda.gov/media/152166/download  Fact Sheet for Healthcare Providers:  SeriousBroker.ithttps://www.fda.gov/media/152162/download  This test is no t yet approved or cleared by the [place] FDA and  has been authorized for detection and/or diagnosis of SARS-CoV-2 by FDA under an Emergency Use Authorization (EUA). This EUA will remain  in effect (meaning this test can be used) for the duration of the COVID-19 declaration under Section 564(b)(1) of the Act, 21 U.S.C.section 360bbb-3(b)(1), unless the authorization is terminated  or revoked sooner.       Influenza A by PCR NEGATIVE NEGATIVE Final   Influenza B by PCR NEGATIVE NEGATIVE Final    Comment: (NOTE) The Xpert Xpress SARS-CoV-2/FLU/RSV plus assay is intended as an aid in the diagnosis of influenza from Nasopharyngeal swab specimens and should not be used as a sole basis for treatment. Nasal washings and aspirates are unacceptable for Xpert Xpress SARS-CoV-2/FLU/RSV testing.  Fact Sheet for Patients: BloggerCourse.comhttps://www.fda.gov/media/152166/download  Fact Sheet for Healthcare Providers: SeriousBroker.ithttps://www.fda.gov/media/152162/download  This test is not yet approved or cleared by the [place] FDA and has been authorized for detection and/or diagnosis of SARS-CoV-2 by FDA under an Emergency Use Authorization (EUA). This EUA will remain in effect (meaning this test can be used) for the duration of the COVID-19 declaration under Section 564(b)(1) of the Act, 21 U.S.C. section  360bbb-3(b)(1), unless the authorization is terminated or revoked.  Performed at [facility], [address] [place], [place] [address]   Blood culture (routine x 2)     Status: None   Collection Time: 03/25/22  8:47 AM   Specimen: Right Antecubital; Blood  Result Value Ref Range Status  Specimen Description RIGHT ANTECUBITAL  Final   Special Requests   Final    BOTTLES DRAWN AEROBIC AND ANAEROBIC Blood Culture adequate volume   Culture   Final    NO GROWTH 5 DAYS Performed at [facility], [address] [place], [place] [address]    Report Status 03/30/2022 FINAL  Final     Labs: BNP (last 3 results) No results for input(s): "BNP" in the last 8760 hours. Basic Metabolic Panel: Recent Labs  Lab 03/25/22 1826 03/26/22 0500 03/26/22 0644 03/27/22 0507 03/28/22 0518 03/29/22 0531 03/30/22 0425 04/01/22 0611  NA 136 138  --  134* 137 137 139  --   K 4.0 3.6  --  3.4* 3.4* 4.1 4.2  --   CL 103 113*  --  108 105 108 109  --   CO2 21* 16*  --  20* 24 21* 24  --   GLUCOSE 182* 87  --  140* 186* 172* 200*  --   BUN 15 9  --  10 10 10 12   --   CREATININE 1.11 0.56*  --  0.80 0.84 0.73 0.77 0.82  CALCIUM 8.6* 6.7*  --  7.6* 8.5* 8.1* 8.6*  --   MG 2.2  --  2.3  --   --   --   --   --   PHOS  --   --  2.5  --   --   --   --   --    Liver Function Tests: Recent Labs  Lab 03/25/22 1826 03/26/22 0644 03/28/22 0518 03/29/22 0531  AST 148* 313* 116* 77*  ALT 41 74* 51* 44  ALKPHOS 57 72 60 61  BILITOT 1.7* 2.3* 1.2 0.7  PROT 6.5 7.8 6.0* 5.5*  ALBUMIN 3.3* 4.2 2.8* 2.5*   No results for input(s): "LIPASE", "AMYLASE" in the last 168 hours. Recent Labs  Lab 03/26/22 0644  AMMONIA <10   CBC: Recent Labs  Lab 03/25/22 1826 03/26/22 0500 03/26/22 0644 03/27/22 0507 03/28/22 0518 03/29/22 0531 03/30/22 0425  WBC 9.5   < > 8.6 6.0 6.1 5.9 6.0  NEUTROABS 8.2*  --  6.8  --   --   --   --   HGB 14.0   < > 14.3 11.7* 12.7* 12.2* 12.5*  HCT  40.9   < > 41.5 33.7* 36.7* 36.1* 36.5*  MCV 100.2*   < > 102.0* 100.3* 100.0 101.7* 102.2*  PLT 83*   < > 99* 66* 80* 92* 101*   < > = values in this interval not displayed.   Cardiac Enzymes: Recent Labs  Lab 03/27/22 1307 03/28/22 0514 03/29/22 0531 03/30/22 0425  CKTOTAL 5,777* 3,058* 917* 414*   BNP: Invalid input(s): "POCBNP" CBG: Recent Labs  Lab 03/30/22 1610 03/31/22 0731 03/31/22 1141 03/31/22 1654 04/01/22 0752  GLUCAP 220* 168* 157* 191* 157*   D-Dimer No results for input(s): "DDIMER" in the last 72 hours. Hgb A1c No results for input(s): "HGBA1C" in the last 72 hours. Lipid Profile No results for input(s): "CHOL", "HDL", "LDLCALC", "TRIG", "CHOLHDL", "LDLDIRECT" in the last 72 hours. Thyroid function studies No results for input(s): "TSH", "T4TOTAL", "T3FREE", "THYROIDAB" in the last 72 hours.  Invalid input(s): "FREET3" Anemia work up No results for input(s): "VITAMINB12", "FOLATE", "FERRITIN", "TIBC", "IRON", "RETICCTPCT" in the last 72 hours. Urinalysis    Component Value Date/Time   COLORURINE YELLOW (A) 03/25/2022 1827   APPEARANCEUR CLOUDY (A) 03/25/2022 1827   LABSPEC 1.017 03/25/2022  1827   PHURINE 5.0 03/25/2022 1827   GLUCOSEU >=500 (A) 03/25/2022 1827   HGBUR LARGE (A) 03/25/2022 1827   BILIRUBINUR NEGATIVE 03/25/2022 1827   KETONESUR 20 (A) 03/25/2022 1827   PROTEINUR 100 (A) 03/25/2022 1827   UROBILINOGEN 0.2 03/26/2013 1219   NITRITE NEGATIVE 03/25/2022 1827   LEUKOCYTESUR NEGATIVE 03/25/2022 1827   Sepsis Labs Recent Labs  Lab 03/27/22 0507 03/28/22 0518 03/29/22 0531 03/30/22 0425  WBC 6.0 6.1 5.9 6.0   Microbiology Recent Results (from the past 240 hour(s))  Blood culture (routine x 2)     Status: None   Collection Time: 03/25/22  8:34 AM   Specimen: Right Antecubital; Blood  Result Value Ref Range Status   Specimen Description RIGHT ANTECUBITAL  Final   Special Requests   Final    BOTTLES DRAWN AEROBIC AND  ANAEROBIC Blood Culture results may not be optimal due to an inadequate volume of blood received in culture bottles   Culture   Final    NO GROWTH 5 DAYS Performed at [facility], [address], [place], [place] [address]    Report Status 03/30/2022 FINAL  Final  Resp Panel by RT-PCR (Flu A&B, Covid) Anterior Nasal Swab     Status: None   Collection Time: 03/25/22  8:35 AM   Specimen: Anterior Nasal Swab  Result Value Ref Range Status   SARS Coronavirus 2 by RT PCR NEGATIVE NEGATIVE Final    Comment: (NOTE) SARS-CoV-2 target nucleic acids are NOT DETECTED.  The SARS-CoV-2 RNA is generally detectable in upper respiratory specimens during the acute phase of infection. The lowest concentration of SARS-CoV-2 viral copies this assay can detect is 138 copies/mL. A negative result does not preclude SARS-Cov-2 infection and should not be used as the sole basis for treatment or other patient management decisions. A negative result may occur with  improper specimen collection/handling, submission of specimen other than nasopharyngeal swab, presence of viral mutation(s) within the areas targeted by this assay, and inadequate number of viral copies(<138 copies/mL). A negative result must be combined with clinical observations, patient history, and epidemiological information. The expected result is Negative.  Fact Sheet for Patients:  BloggerCourse.com  Fact Sheet for Healthcare Providers:  SeriousBroker.it  This test is no t yet approved or cleared by the [place] FDA and  has been authorized for detection and/or diagnosis of SARS-CoV-2 by FDA under an Emergency Use Authorization (EUA). This EUA will remain  in effect (meaning this test can be used) for the duration of the COVID-19 declaration under Section 564(b)(1) of the Act, 21 U.S.C.section 360bbb-3(b)(1), unless the authorization is terminated  or revoked sooner.        Influenza A by PCR NEGATIVE NEGATIVE Final   Influenza B by PCR NEGATIVE NEGATIVE Final    Comment: (NOTE) The Xpert Xpress SARS-CoV-2/FLU/RSV plus assay is intended as an aid in the diagnosis of influenza from Nasopharyngeal swab specimens and should not be used as a sole basis for treatment. Nasal washings and aspirates are unacceptable for Xpert Xpress SARS-CoV-2/FLU/RSV testing.  Fact Sheet for Patients: BloggerCourse.com  Fact Sheet for Healthcare Providers: SeriousBroker.it  This test is not yet approved or cleared by the [place] FDA and has been authorized for detection and/or diagnosis of SARS-CoV-2 by FDA under an Emergency Use Authorization (EUA). This EUA will remain in effect (meaning this test can be used) for the duration of the COVID-19 declaration under Section 564(b)(1) of the Act, 21 U.S.C. section 360bbb-3(b)(1), unless  the authorization is terminated or revoked.  Performed at [facility], [address] [place], [place] [address]   Blood culture (routine x 2)     Status: None   Collection Time: 03/25/22  8:47 AM   Specimen: Right Antecubital; Blood  Result Value Ref Range Status   Specimen Description RIGHT ANTECUBITAL  Final   Special Requests   Final    BOTTLES DRAWN AEROBIC AND ANAEROBIC Blood Culture adequate volume   Culture   Final    NO GROWTH 5 DAYS Performed at [facility], [address] [place], [place] [address]    Report Status 03/30/2022 FINAL  Final   Imaging CT HEAD WO CONTRAST ( )  Result Date: 03/25/2022 CLINICAL DATA:  Neuro deficit EXAM: CT HEAD WITHOUT CONTRAST TECHNIQUE: Contiguous axial images were obtained from the base of the skull through the vertex without intravenous contrast. RADIATION DOSE REDUCTION: This exam was performed according to the departmental dose-optimization program which includes automated exposure control, adjustment of  the mA and/or kV according to patient size and/or use of iterative reconstruction technique. COMPARISON:  CT brain 03/25/2022 FINDINGS: Brain: No acute territorial infarction, hemorrhage or intracranial mass. Atrophy and mild chronic small vessel ischemic changes of the white matter. Stable ventricle size. Chronic lacunar infarct versus dilated perivascular space in the right posterior sub insula. Small chronic right cerebellar infarcts. Vascular: No hyperdense vessels.  Carotid vascular calcification. Skull: Normal. Negative for fracture or focal lesion. Sinuses/Orbits: Mild mucosal thickening in the sinuses Other: None IMPRESSION: 1. No CT evidence for acute intracranial abnormality. 2. Atrophy and chronic small vessel ischemic changes of the white matter. Electronically Signed   By: Jasmine Pang M.D.   On: 03/25/2022 18:22   CT Cervical Spine Wo Contrast  Result Date: 03/25/2022 CLINICAL DATA:  Neck trauma. EXAM: CT CERVICAL SPINE WITHOUT CONTRAST TECHNIQUE: Multidetector CT imaging of the cervical spine was performed without intravenous contrast. Multiplanar CT image reconstructions were also generated. RADIATION DOSE REDUCTION: This exam was performed according to the departmental dose-optimization program which includes automated exposure control, adjustment of the mA and/or kV according to patient size and/or use of iterative reconstruction technique. COMPARISON:  January 19, 2022 FINDINGS: Alignment: Normal. Skull base and vertebrae: No acute fracture. No primary bone lesion or focal pathologic process. Soft tissues and spinal canal: No prevertebral fluid or swelling. No visible canal hematoma. Disc levels: Mild multilevel osteoarthritic changes. Right facet arthropathy at C3-C4 with right neural foraminal stenosis. Upper chest: Negative. Other: None. IMPRESSION: 1. No acute fracture or subluxation of the cervical spine. 2. Mild multilevel osteoarthritic changes. Right facet arthropathy at C3-C4 with  right neural foraminal stenosis. Electronically Signed   By: Ted Mcalpine M.D.   On: 03/25/2022 09:26   CT HEAD WO CONTRAST ( )  Result Date: 03/25/2022 CLINICAL DATA:  Head trauma. EXAM: CT HEAD WITHOUT CONTRAST TECHNIQUE: Contiguous axial images were obtained from the base of the skull through the vertex without intravenous contrast. RADIATION DOSE REDUCTION: This exam was performed according to the departmental dose-optimization program which includes automated exposure control, adjustment of the mA and/or kV according to patient size and/or use of iterative reconstruction technique. COMPARISON:  January 19, 2022 FINDINGS: Brain: No evidence of acute infarction, hemorrhage, hydrocephalus, extra-axial collection or mass lesion/mass effect. Marked brain parenchymal volume loss and moderate deep white matter microangiopathy. Vascular: No hyperdense vessel or unexpected calcification. Skull: Normal. Negative for fracture or focal lesion. Sinuses/Orbits: No acute finding. Other: None. IMPRESSION: 1. No acute  intracranial abnormality. 2. Marked brain parenchymal volume loss and moderate deep white matter microangiopathy. Electronically Signed   By: Ted Mcalpine M.D.   On: 03/25/2022 09:23   DG Chest Portable 1 View  Result Date: 03/25/2022 CLINICAL DATA:  Sepsis. EXAM: PORTABLE CHEST 1 VIEW COMPARISON:  May 13, 2021. FINDINGS: The heart size and mediastinal contours are within normal limits. Sternotomy wires are noted. Both lungs are clear. The visualized skeletal structures are unremarkable. IMPRESSION: No active disease. Electronically Signed   By: Lupita Raider M.D.   On: 03/25/2022 09:14      Time coordinating discharge: over 30 minutes  SIGNED:  Sunnie Nielsen DO [facility]

## 2022-04-01 NOTE — Progress Notes (Signed)
Report called to [facility] LPN @ [facility], pt waiting on EMS for transportation

## 2022-04-01 NOTE — TOC Progression Note (Signed)
Transition of Care [facility]) - Progression Note    Patient Details  Name: Hector Miller MRN: [id number] Date of Birth: 1945/08/28  Transition of Care [facility]) CM/SW Contact  Marlowe Sax, RN Phone Number: 04/01/2022, 12:06 PM  Clinical Narrative:    The patient will go to room 322 P at [facility] to transport  Called the patient's Danise Mina with the patient's permission to notify of the DC EMS called to arrange for Transport   Expected Discharge Plan: Skilled Nursing Facility Barriers to Discharge: Insurance Authorization  Expected Discharge Plan and Services Expected Discharge Plan: Skilled Nursing Facility         Expected Discharge Date: 04/01/22                                     Social Determinants of Health (SDOH) Interventions    Readmission Risk Interventions    05/16/2021   11:08 AM  Readmission Risk Prevention Plan  Post Dischage Appt Complete  Medication Screening Complete  Transportation Screening Complete

## 2022-04-01 NOTE — Progress Notes (Signed)
BP 104/77, MD notified per MD hold morning does Metoprolol.

## 2022-04-04 NOTE — Telephone Encounter (Signed)
[facility] called and scheduled his appt for 5-6 weeks out. They confirmed 12/12 at 2pm with [facility] 1 hour prior.

## 2022-04-11 ENCOUNTER — Encounter: Payer: Self-pay | Admitting: Emergency Medicine

## 2022-04-11 ENCOUNTER — Emergency Department
Admission: EM | Admit: 2022-04-11 | Discharge: 2022-04-11 | Disposition: A | Discharge status: Home / Self Care | Payer: No Typology Code available for payment source | Attending: Emergency Medicine | Admitting: Emergency Medicine

## 2022-04-11 ENCOUNTER — Emergency Department: Payer: No Typology Code available for payment source

## 2022-04-11 DIAGNOSIS — E119 Type 2 diabetes mellitus without complications: Secondary | ICD-10-CM | POA: Diagnosis not present

## 2022-04-11 DIAGNOSIS — W050XXA Fall from non-moving wheelchair, initial encounter: Secondary | ICD-10-CM | POA: Insufficient documentation

## 2022-04-11 DIAGNOSIS — Z7982 Long term (current) use of aspirin: Secondary | ICD-10-CM | POA: Diagnosis not present

## 2022-04-11 DIAGNOSIS — I10 Essential (primary) hypertension: Secondary | ICD-10-CM | POA: Insufficient documentation

## 2022-04-11 DIAGNOSIS — I2510 Atherosclerotic heart disease of native coronary artery without angina pectoris: Secondary | ICD-10-CM | POA: Insufficient documentation

## 2022-04-11 DIAGNOSIS — M533 Sacrococcygeal disorders, not elsewhere classified: Secondary | ICD-10-CM | POA: Diagnosis not present

## 2022-04-11 DIAGNOSIS — W19XXXA Unspecified fall, initial encounter: Secondary | ICD-10-CM

## 2022-04-11 DIAGNOSIS — Z79899 Other long term (current) drug therapy: Secondary | ICD-10-CM | POA: Diagnosis not present

## 2022-04-11 DIAGNOSIS — Z7984 Long term (current) use of oral hypoglycemic drugs: Secondary | ICD-10-CM | POA: Insufficient documentation

## 2022-04-11 MED ORDER — ACETAMINOPHEN 500 MG PO TABS
1000.0000 mg | ORAL_TABLET | Freq: Once | ORAL | Status: AC
  Administered 2022-04-11: 1000 mg via ORAL
  Filled 2022-04-11: qty 2

## 2022-04-11 NOTE — ED Triage Notes (Signed)
Pt presents via EMS from [facility] with complaints of an unwitnessed fall, he slid out of his wheelchair. Pt is A&Ox3, endorses gluteal pain from how he landed. Denies LOC, not on thinners, did not hit head, CP or SOB.

## 2022-04-11 NOTE — Discharge Instructions (Signed)
X-rays of your pelvis and tailbone showed no fracture.  He may take Tylenol as needed for pain.

## 2022-04-11 NOTE — ED Notes (Signed)
Pt Dc back to [facility] via [facility]. Bedside report given to EMS crew and care endorsed at this time. AVS and DNR paperwork given to crew at bedside. Pt out of dept via stretcher

## 2022-04-11 NOTE — ED Provider Notes (Signed)
[facility] Provider Note    Event Date/Time   First MD Initiated Contact with Patient 04/11/22 0421     (approximate)   History   Fall   HPI  Hector Miller is a 76 y.o. male with history of hypertension, diabetes, hyperlipidemia who presents from his nursing home after he slipped and fell out of his wheelchair.  He is on aspirin but no other antiplatelet and no anticoagulant.  States he did not hit his head but did hit his tailbone and is complaining of tailbone pain.  States he normally is in a wheelchair or uses a rollator to walk.  Has history of frequent falls.  He denies headache, neck or back pain, chest or abdominal pain, fevers, cough, shortness of breath, vomiting or diarrhea, urinary symptoms, numbness or weakness.   History provided by patient.    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 Authorizing Provider  acetaminophen (TYLENOL) 325 MG tablet Take 2 tablets (650 mg total) by mouth every 6 (six) hours as needed for mild pain, fever or headache (or Fever >/= 101). 04/01/22   Sunnie Nielsen, DO  albuterol (VENTOLIN HFA) 108 (90 Base) MCG/ACT inhaler Inhale 2 puffs into the lungs every 6 (six) hours as needed for wheezing or shortness of breath. 05/19/21   Darlin Drop, DO  aspirin 81 MG chewable tablet Chew 1 tablet (81 mg total) by mouth daily. 04/01/22   Sunnie Nielsen, DO  benzonatate (TESSALON PERLES) 100 MG capsule Take 2 capsules (200 mg  total) by mouth 3 (three) times daily as needed for cough. 05/19/21 05/19/22  Darlin Drop, DO  cholecalciferol (VITAMIN D) 25 MCG (1000 UNIT) tablet Take 1,000 Units by mouth daily.    [provider]  finasteride (PROSCAR) 5 MG tablet Take 5 mg by mouth daily.    [provider]  folic acid (FOLVITE) 1 MG tablet Take 1 mg by mouth daily.    [provider]  gabapentin (NEURONTIN) 300 MG capsule Take 300 mg by mouth 3 (three) times daily.    [provider]  metFORMIN (GLUCOPHAGE) 500 MG tablet Take 1,000 mg by mouth 2 (two) times daily with a meal.    [provider]  methocarbamol (ROBAXIN) 500 MG tablet Take 1 tablet (500 mg total) by mouth every 8 (eight) hours as needed for muscle spasms. 04/01/22   Sunnie Nielsen, DO  metoprolol tartrate (LOPRESSOR) 25 MG tablet Take 0.5 tablets (12.5 mg total) by mouth 2 (two) times daily. 04/01/22   Sunnie Nielsen, DO  mirtazapine (REMERON) 30 MG tablet Take 30 mg by mouth at bedtime.    [provider]  Omega-3 Fatty Acids (FISH OIL) 1000 MG CAPS Take 1,000 mg by mouth 2 (two) times daily. Patient not taking: Reported on 03/25/2022    [provider]  oxyCODONE (OXY IR/ROXICODONE) 5 MG  immediate release tablet Take 1-2 tablets (5-10 mg total) by mouth every 6 (six) hours as needed for moderate pain or severe pain. 04/01/22   Sunnie Nielsen, DO  pantoprazole (PROTONIX) 40 MG tablet Take 40 mg by mouth daily.    [provider]  polyethylene glycol (MIRALAX / GLYCOLAX) 17 g packet Take 17 g by mouth daily as needed for mild constipation. 04/01/22   Sunnie Nielsen, DO  tamsulosin (FLOMAX) 0.4 MG CAPS capsule Take 2 capsules (0.8 mg total) by mouth daily. 04/01/22   Sunnie Nielsen, DO  thiamine (VITAMIN B-1) 100 MG tablet Take 100 mg by mouth daily.    [provider]    Physical Exam   Triage Vital Signs: ED Triage Vitals  Enc Vitals Group     BP 04/11/22  0135 113/66     Pulse Rate 04/11/22 0135 81     Resp 04/11/22 0135 16     Temp 04/11/22 0135 98.4 F (36.9 C)     Temp Source 04/11/22 0135 Oral     SpO2 04/11/22 0135 92 %     Weight 04/11/22 0134 212 lb 11.9 oz (96.5 kg)     Height 04/11/22 0134 6' (1.829 m)     Head Circumference --      Peak Flow --      Pain Score 04/11/22 0136 3     Pain Loc --      Pain Edu? --      Excl. in GC? --     Most recent vital signs: Vitals:   04/11/22 0135  BP: 113/66  Pulse: 81  Resp: 16  Temp: 98.4 F (36.9 C)  SpO2: 92%     CONSTITUTIONAL: Alert and oriented x 4 and responds appropriately to questions. Well-appearing; well-nourished; GCS 15 HEAD: Normocephalic; atraumatic EYES: Conjunctivae clear, PERRL, EOMI ENT: normal nose; no rhinorrhea; moist mucous membranes; pharynx without lesions noted; no dental injury; no septal hematoma, no epistaxis; no facial deformity or bony tenderness NECK: Supple, no midline spinal tenderness, step-off or deformity; trachea midline CARD: RRR; S1 and S2 appreciated; no murmurs, no clicks, no rubs, no gallops RESP: Normal chest excursion without splinting or tachypnea; breath sounds clear and equal bilaterally; no wheezes, no rhonchi, no rales; no hypoxia or respiratory distress CHEST:  chest wall stable, no crepitus or ecchymosis or deformity, nontender to palpation; no flail chest ABD/GI: Normal bowel sounds; non-distended; soft, non-tender, no rebound, no guarding; no ecchymosis or other lesions noted PELVIS:  stable, nontender to palpation tender over the sacrum/coccyx without deformity, bruising, no leg length discrepancy BACK:  The back appears normal; no midline spinal tenderness, step-off or deformity EXT: Normal ROM in all joints; non-tender to palpation; no edema; normal capillary refill; no cyanosis, no bony tenderness or bony deformity of patient's extremities, no joint effusion, compartments are soft, extremities are warm and well-perfused, no  ecchymosis SKIN: Normal color for age and race; warm NEURO: No facial asymmetry, normal speech, moving all extremities equally, reports normal sensation diffusely  ED Results / Procedures / Treatments   LABS: (all labs ordered are listed, but only abnormal results are displayed) Labs Reviewed - No data to display   EKG:   RADIOLOGY: My personal review and interpretation of imaging: X-ray showed no acute abnormality.  I have personally reviewed all radiology reports. DG Pelvis 1-2 Views  Result Date: 04/11/2022 CLINICAL DATA:  Fall from wheelchair with buttock pain, initial encounter EXAM: PELVIS - 1-VIEW COMPARISON:  None Available. FINDINGS: Pelvic  ring is intact. No acute fracture or dislocation is noted. No soft tissue changes are seen. IMPRESSION: No acute abnormality noted. Electronically Signed   By: Alcide Clever M.D.   On: 04/11/2022 02:28   DG Sacrum/Coccyx  Result Date: 04/11/2022 CLINICAL DATA:  Recent fall with buttock pain, initial encounter EXAM: SACRUM AND COCCYX - 2+ VIEW COMPARISON:  None Available. FINDINGS: Sacral ala are within normal limits. Pelvic ring is intact. No acute fracture is noted. Multilevel degenerative changes in the lumbar spine are noted. IMPRESSION: No acute abnormality noted. Electronically Signed   By: Alcide Clever M.D.   On: 04/11/2022 02:28     PROCEDURES:  Critical Care performed: No    Procedures    IMPRESSION / MDM / ASSESSMENT AND PLAN / ED COURSE  I reviewed the triage vital signs and the nursing notes.  Patient here with complaints of tailbone pain after he slid out of his wheelchair.     DIFFERENTIAL DIAGNOSIS (includes but not limited to):   Contusion, sacral coccyx fracture, less likely pelvic or hip fracture  Patient's presentation is most consistent with acute complicated illness / injury requiring diagnostic workup.  PLAN: X-rays of the pelvis and sacrum obtained from triage and reviewed and interpreted by myself  and the radiologist and show no acute abnormality.  He adamantly denies that he hit his head and is not on any blood thinners.  He is oriented and neurologically intact.  He has no other complaints.  Will give Tylenol and discharged back to his nursing home.   MEDICATIONS GIVEN IN ED: Medications  acetaminophen (TYLENOL) tablet 1,000 mg (1,000 mg Oral Given 04/11/22 0444)     ED COURSE:  At this time, I do not feel there is any life-threatening condition present. I reviewed all nursing notes, vitals, pertinent previous records.  All lab and urine results, EKGs, imaging ordered have been independently reviewed and interpreted by myself.  I reviewed all available radiology reports from any imaging ordered this visit.  Based on my assessment, I feel the patient is safe to be discharged home without further emergent workup and can continue workup as an outpatient as needed. Discussed all findings, treatment plan as well as usual and customary return precautions.  They verbalize understanding and are comfortable with this plan.  Outpatient follow-up has been provided as needed.  All questions have been answered.    CONSULTS:  none   OUTSIDE RECORDS REVIEWED: [facility].       FINAL CLINICAL IMPRESSION(S) / ED DIAGNOSES   Final diagnoses:  Fall, initial encounter     Rx / DC Orders   ED Discharge Orders     None        Note:  This document was prepared using Dragon voice recognition software and may include unintentional dictation errors.   Dquan Cortopassi, Layla Maw, DO 04/11/22 [phone]

## 2022-04-11 NOTE — ED Triage Notes (Signed)
Pt to ED by EMS for a mechanical fall. Pt denies LOC. Pt denies N/V/D. Pt denies hitting his head. Pt states his butt hurts. No other complaints at this time.

## 2022-04-14 ENCOUNTER — Ambulatory Visit: Payer: Self-pay | Admitting: Urology

## 2022-04-14 ENCOUNTER — Ambulatory Visit (INDEPENDENT_AMBULATORY_CARE_PROVIDER_SITE_OTHER): Payer: Medicare PPO | Admitting: Urology

## 2022-04-14 ENCOUNTER — Encounter: Payer: Self-pay | Admitting: Urology

## 2022-04-14 VITALS — BP 117/78 | HR 92 | Ht 72.0 in | Wt 200.0 lb

## 2022-04-14 DIAGNOSIS — R339 Retention of urine, unspecified: Secondary | ICD-10-CM | POA: Diagnosis not present

## 2022-04-14 LAB — BLADDER SCAN AMB NON-IMAGING: Scan Result: 1

## 2022-04-14 NOTE — Progress Notes (Signed)
04/14/2022 8:24 AM   Hector Miller 1946/01/27 [id number]  Referring provider: [facility], [facility] [address] [place],  [place] [address]  No chief complaint on file.   HPI: Hector Miller is a 76 y.o. male who presents for follow-up of urinary retention.  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 If you have any questions, ask your nurse or doctor.          acetaminophen 325 MG tablet Commonly known as: TYLENOL Take 2 tablets (650 mg total) by mouth every 6 (six) hours as needed for mild pain, fever or headache (or Fever >/= 101).   albuterol 108 (90 Base) MCG/ACT inhaler Commonly known as: VENTOLIN HFA Inhale 2 puffs into the lungs every 6 (six) hours as needed for wheezing or shortness of breath.   aspirin 81 MG chewable tablet Chew 1 tablet (81 mg total) by mouth daily.   benzonatate 100 MG capsule Commonly known as: Tessalon Perles Take 2 capsules (200 mg total) by mouth 3 (three) times daily as needed for cough.   cholecalciferol 25 MCG (1000 UNIT) tablet Commonly known as: VITAMIN D3 Take 1,000 Units by mouth daily.   finasteride 5 MG tablet Commonly known as: PROSCAR Take 5 mg by mouth daily.   Fish Oil 1000 MG Caps Take 1,000 mg by mouth 2 (two) times daily.   folic acid 1 MG  tablet Commonly known as: FOLVITE Take 1 mg by mouth daily.   gabapentin 300 MG capsule Commonly known as: NEURONTIN Take 300 mg by mouth 3 (three) times daily.   metFORMIN 500 MG tablet Commonly known as: GLUCOPHAGE Take 1,000 mg by mouth 2 (two) times daily with a meal.   methocarbamol 500 MG tablet Commonly known as: ROBAXIN Take 1 tablet (500 mg total) by mouth every 8 (eight) hours as needed for muscle spasms.   metoprolol tartrate 25 MG tablet Commonly known as: LOPRESSOR Take 0.5 tablets (12.5 mg total) by mouth 2 (two) times daily.   mirtazapine 30 MG tablet Commonly known as: REMERON Take 30 mg by mouth at bedtime.   oxyCODONE 5 MG immediate release tablet Commonly known as: Oxy IR/ROXICODONE Take 1-2 tablets (5-10 mg total) by mouth every 6 (six) hours as needed for moderate pain or severe pain.   pantoprazole 40 MG tablet Commonly known as: PROTONIX Take 40 mg by mouth daily.   polyethylene glycol 17 g packet Commonly known as: MIRALAX / GLYCOLAX Take 17 g by mouth daily as needed for mild constipation.   tamsulosin 0.4 MG Caps capsule Commonly known as: FLOMAX Take 2 capsules (0.8 mg total) by  mouth daily.   thiamine 100 MG tablet Commonly known as: Vitamin B-1 Take 100 mg by mouth daily.        Allergies:  Allergies  Allergen Reactions   Lisinopril Cough    Family History: Family History  Problem Relation Age of Onset   Cancer Mother        Breast   Parkinson's disease Mother    Heart failure Father    Cancer Father        Stomach   Stroke Father    Hypertension Father     Social History:  reports that he quit smoking about 52 years ago. His smoking use included cigarettes, cigars, and pipe. He has quit using smokeless tobacco. He reports current alcohol use. He reports that he does not use drugs.   Physical Exam: BP 117/78   Pulse 92   Ht 6' (1.829 m)   Wt 200 lb (90.7 kg)   BMI 27.12 kg/m   Constitutional:  Alert, No acute  distress. HEENT: [place] AT Respiratory: Normal respiratory effort, no increased work of breathing. GU: Cath urine clear Psychiatric: Normal mood and affect.   Assessment & Plan:    1.  Urinary retention Most likely secondary to BPH Cath removed for voiding trial Follow-up visit scheduled this afternoon for a bladder scan  Riki Altes, MD  [facility] [address], [address] [place], [place] [address] [phone]

## 2022-04-14 NOTE — Addendum Note (Signed)
Addended by: Levada Schilling on: 04/14/2022 01:42 PM   Modules accepted: Orders

## 2022-04-14 NOTE — Progress Notes (Signed)
Catheter Removal  Patient is present today for a catheter removal.  79ml of water was drained from the balloon. A 16FR foley cath was removed from the bladder, no complications were noted. Patient tolerated well.  Performed by: Ples Specter CMA  Follow up/ Additional notes: this afternoon

## 2022-04-25 ENCOUNTER — Other Ambulatory Visit: Payer: Self-pay

## 2022-04-25 DIAGNOSIS — S32010A Wedge compression fracture of first lumbar vertebra, initial encounter for closed fracture: Secondary | ICD-10-CM

## 2022-04-25 DIAGNOSIS — S22080S Wedge compression fracture of T11-T12 vertebra, sequela: Secondary | ICD-10-CM

## 2022-04-25 NOTE — Progress Notes (Unsigned)
Referring Physician:  Center, [facility] [address] [place],  [place] [address]  Primary Physician:  Center, [facility]  History of Present Illness: 04/26/2022 Hector Miller has a history of DM, HTN, CAD, alcohol abuse, and hyperlipidemia. History of CABG and valve replacement surgery.   Was seen admitted to [facility] on 03/25/22 for severe sepsis after he came to ED after he fell out of bed at [facility]. He was seen by both Dr. Katrinka Blazing and Dr. Myer Haff while in the hospital for L1 compression fracture. He was to wear brace when out of bed.   He was seen back in ED on 04/11/22 after another fall.   He is here for follow up.   He continues with constant LBP with no leg pain. He is not wearing his brace- he left it at home. He states that he left the SNF and is now at home. He has no numbness, tingling, weakness in his legs. He has history of chronic LBP since the [employer], but it has been worse since his fall in November.   He was given oxycodone and ultram when he left the facility. He is not sure what medications he is taking and does not know if he needs refills.   Review of Systems:  A 10 point review of systems is negative, except for the pertinent positives and negatives detailed in the HPI.  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   albuterol (VENTOLIN HFA) 108 (90 Base) MCG/ACT inhaler Inhale 2 puffs into the lungs every 6 (six) hours as needed for wheezing or shortness of breath.   aspirin 81 MG chewable tablet Chew 1 tablet (81 mg total) by mouth daily.   benzonatate (TESSALON PERLES) 100 MG capsule Take 2 capsules (200 mg total) by mouth 3 (three) times daily as needed for cough.   cholecalciferol (VITAMIN D) 25 MCG (1000 UNIT) tablet Take 1,000 Units by mouth daily.   finasteride (PROSCAR) 5 MG tablet Take 5 mg by mouth daily.   folic acid (FOLVITE) 1 MG tablet Take 1 mg by mouth daily.   gabapentin (NEURONTIN) 300 MG capsule Take 300 mg by mouth 3 (three) times daily.   metFORMIN (GLUCOPHAGE) 500 MG tablet Take 1,000 mg by mouth 2 (two) times daily with a meal.   methocarbamol (ROBAXIN) 500 MG tablet Take 1 tablet (500 mg total) by mouth every 8 (eight) hours as needed for muscle spasms.   metoprolol tartrate (LOPRESSOR) 25 MG tablet Take 0.5 tablets (12.5 mg total) by mouth 2 (two) times daily.  mirtazapine (REMERON) 30 MG tablet Take 30 mg by mouth at bedtime.   Omega-3 Fatty Acids (FISH OIL) 1000 MG CAPS Take 1,000 mg by mouth 2 (two) times daily.   oxyCODONE (OXY IR/ROXICODONE) 5 MG immediate release tablet Take 1-2 tablets (5-10 mg total) by mouth every 6 (six) hours as needed for moderate pain or severe pain.   pantoprazole (PROTONIX) 40 MG tablet Take 40 mg by mouth daily.   polyethylene glycol (MIRALAX / GLYCOLAX) 17 g packet Take 17 g by mouth daily as needed for mild constipation.   tamsulosin (FLOMAX) 0.4 MG CAPS capsule Take 2 capsules (0.8 mg total) by mouth daily.   thiamine (VITAMIN B-1) 100 MG tablet Take 100 mg by mouth daily.   No facility-administered encounter medications on file as of 04/26/2022.    Social  History: Social History   Tobacco Use   Smoking status: Former    Types: Cigarettes, Cigars, Pipe    Quit date: 03/26/1970    Years since quitting: 52.1   Smokeless tobacco: Former  Scientific laboratory technician Use: Never used  Substance Use Topics   Alcohol use: Yes    Alcohol/week: 0.0 standard drinks of alcohol    Comment: daily-5-6 shots of liquor   Drug use: No    Family Medical History: Family History  Problem Relation Age of Onset   Cancer Mother        Breast   Parkinson's disease Mother    Heart failure Father    Cancer Father        Stomach   Stroke Father    Hypertension Father     Physical Examination: Vitals:   04/26/22 1409  BP: 116/70    General: Patient is well developed, well nourished, calm, collected, and in no apparent distress. Attention to examination is appropriate.  Respiratory: Patient is breathing without any difficulty.   NEUROLOGICAL:     Awake, alert, oriented to person, place, and time.  Speech is clear and fluent.   Cranial Nerves: Pupils equal round and reactive to light.  Facial tone is symmetric.    ROM of TL spine not tested.   He has diffuse mid lumbar tenderness.   No abnormal lesions on exposed skin.   Strength: Side Biceps Triceps Deltoid Interossei Grip Wrist Ext. Wrist Flex.  R 5 5 5 5 5 5 5   L 5 5 5 5 5 5 5    Side Iliopsoas Quads Hamstring PF DF EHL  R 5 5 5 5 5 5   L 5 5 5 5 5 5    Reflexes are 2+ and symmetric at the biceps, triceps, brachioradialis, patella and achilles.   Hoffman's is absent.  Clonus is not present.   Bilateral upper and lower extremity sensation is intact to light touch.     He ambulates with a walker.    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 Stable compression fracture L1 in comparison to previous imaging (lumbar MRI dated 03/27/22 and thoracic CT dated 03/27/22).   Above xray report not available for my  review.   Assessment and Plan: Hector Miller is a pleasant 76 y.o. male with constant LBP since fall on 03/25/22. No leg pain. History of chronic LBP x years which as been worse since fall.   He states he does not know what medications he is taking or what medications he has at home.   Xrays from today show transitional  anatomy with stable L1 compression fracture.   Treatment options discussed with patient and following plan made:   - He will start wearing his TLSO brace (he has not been wearing). Can remove to sleep.  - No bending, twisting, or lifting.  - Discussed fracture will take 3 months to heal. We did discuss possible kyphoplasty if pain is not improving. He wants to see how he does in next month. Since fracture is stable on xrays from today, I agree with this.  - Looks like he was given ultram and oxycodone on 04/08/22- assuming this is when he left facility.  - We discussed refill of ultram for pain. He wants to get all medications from the [place]. He will contact his PCP at the [place].  - We discussed at length that he needs to taking his regular medications. He states home health was coming out today and he will discuss with them. I also advised him to call his PCP to be sure he has the correct medications.  - Follow up in 4 weeks with repeat lumbar xrays (AP/lat).   I spent a total of 30 minutes in face-to-face and non-face-to-face activities related to this patient's care toda including review of outside records, review of imaging, review of symptoms, physical exam, discussion of differential diagnosis, discussion of treatment options, and documentation.    Geronimo Boot PA-C Dept. of Neurosurgery

## 2022-04-25 NOTE — Addendum Note (Signed)
Addended by: Ernie Hew on: 04/25/2022 03:11 PM   Modules accepted: Orders

## 2022-04-26 ENCOUNTER — Ambulatory Visit (INDEPENDENT_AMBULATORY_CARE_PROVIDER_SITE_OTHER): Payer: No Typology Code available for payment source | Admitting: Orthopedic Surgery

## 2022-04-26 ENCOUNTER — Encounter: Payer: Self-pay | Admitting: Orthopedic Surgery

## 2022-04-26 ENCOUNTER — Ambulatory Visit
Admission: RE | Admit: 2022-04-26 | Discharge: 2022-04-26 | Disposition: A | Discharge status: Home / Self Care | Payer: No Typology Code available for payment source | Source: Ambulatory Visit | Attending: Orthopedic Surgery | Admitting: Orthopedic Surgery

## 2022-04-26 ENCOUNTER — Ambulatory Visit
Admission: RE | Admit: 2022-04-26 | Discharge: 2022-04-26 | Disposition: A | Discharge status: Home / Self Care | Payer: No Typology Code available for payment source | Attending: Orthopedic Surgery | Admitting: Orthopedic Surgery

## 2022-04-26 VITALS — BP 116/70 | Ht 72.0 in | Wt 200.0 lb

## 2022-04-26 DIAGNOSIS — S32010A Wedge compression fracture of first lumbar vertebra, initial encounter for closed fracture: Secondary | ICD-10-CM | POA: Diagnosis not present

## 2022-04-26 DIAGNOSIS — W19XXXA Unspecified fall, initial encounter: Secondary | ICD-10-CM | POA: Diagnosis not present

## 2022-04-26 NOTE — Patient Instructions (Signed)
It was so nice to see you today, I am sorry that you are hurting so much.   You have a compression fracture (broken bone) in your back and this is what is likely causing your increased pain.   Start wearing your brace. You can remove to sleep. Avoid bending, twisting, and lifting.   Okay to get your pain medications from your PCP at the [place]. Call me if any issues. Make sure that you get back on all of your regular medications and call your PCP if you have any concerns about this.   I will see you back in 4 weeks. You will need to come 1 hour prior to this visit to get xrays across the street at [facility].   Please do not hesitate to call if you have any questions or concerns. You can also message me in MyChart.   Drake Leach PA-C [phone]

## 2022-05-11 ENCOUNTER — Other Ambulatory Visit: Payer: Self-pay

## 2022-05-11 ENCOUNTER — Emergency Department: Payer: No Typology Code available for payment source

## 2022-05-11 ENCOUNTER — Emergency Department
Admission: EM | Admit: 2022-05-11 | Discharge: 2022-05-11 | Disposition: A | Discharge status: Home / Self Care | Payer: No Typology Code available for payment source | Attending: Emergency Medicine | Admitting: Emergency Medicine

## 2022-05-11 DIAGNOSIS — R296 Repeated falls: Secondary | ICD-10-CM | POA: Diagnosis not present

## 2022-05-11 DIAGNOSIS — I10 Essential (primary) hypertension: Secondary | ICD-10-CM | POA: Diagnosis not present

## 2022-05-11 DIAGNOSIS — E1165 Type 2 diabetes mellitus with hyperglycemia: Secondary | ICD-10-CM | POA: Diagnosis not present

## 2022-05-11 DIAGNOSIS — R42 Dizziness and giddiness: Secondary | ICD-10-CM | POA: Insufficient documentation

## 2022-05-11 DIAGNOSIS — Y92009 Unspecified place in unspecified non-institutional (private) residence as the place of occurrence of the external cause: Secondary | ICD-10-CM

## 2022-05-11 LAB — COMPREHENSIVE METABOLIC PANEL
ALT: 31 U/L (ref 0–44)
AST: 63 U/L — ABNORMAL HIGH (ref 15–41)
Albumin: 3.8 g/dL (ref 3.5–5.0)
Alkaline Phosphatase: 110 U/L (ref 38–126)
Anion gap: 15 (ref 5–15)
BUN: 8 mg/dL (ref 8–23)
CO2: 18 mmol/L — ABNORMAL LOW (ref 22–32)
Calcium: 9.6 mg/dL (ref 8.9–10.3)
Chloride: 101 mmol/L (ref 98–111)
Creatinine, Ser: 0.95 mg/dL (ref 0.61–1.24)
GFR, Estimated: >60 mL/min (ref >60)
Glucose, Bld: 262 mg/dL — ABNORMAL HIGH (ref 70–99)
Potassium: 3.5 mmol/L (ref 3.5–5.1)
Sodium: 134 mmol/L — ABNORMAL LOW (ref 135–145)
Total Bilirubin: 1.7 mg/dL — ABNORMAL HIGH (ref 0.3–1.2)
Total Protein: 7.3 g/dL (ref 6.5–8.1)

## 2022-05-11 LAB — CBC WITH DIFFERENTIAL/PLATELET
Abs Immature Granulocytes: 0.06 10*3/uL (ref 0.00–0.07)
Basophils Absolute: 0 10*3/uL (ref 0.0–0.1)
Basophils Relative: 0 %
Eosinophils Absolute: 0.1 10*3/uL (ref 0.0–0.5)
Eosinophils Relative: 1 %
HCT: 43.5 % (ref 39.0–52.0)
Hemoglobin: 15 g/dL (ref 13.0–17.0)
Immature Granulocytes: 1 %
Lymphocytes Relative: 8 %
Lymphs Abs: 0.9 10*3/uL (ref 0.7–4.0)
MCH: 33.3 pg (ref 26.0–34.0)
MCHC: 34.5 g/dL (ref 30.0–36.0)
MCV: 96.5 fL (ref 80.0–100.0)
Monocytes Absolute: 1.1 10*3/uL — ABNORMAL HIGH (ref 0.1–1.0)
Monocytes Relative: 9 %
Neutro Abs: 9.3 10*3/uL — ABNORMAL HIGH (ref 1.7–7.7)
Neutrophils Relative %: 81 %
Platelets: 128 10*3/uL — ABNORMAL LOW (ref 150–400)
RBC: 4.51 MIL/uL (ref 4.22–5.81)
RDW: 12.8 % (ref 11.5–15.5)
WBC: 11.5 10*3/uL — ABNORMAL HIGH (ref 4.0–10.5)
nRBC: 0 % (ref 0.0–0.2)

## 2022-05-11 LAB — TROPONIN I (HIGH SENSITIVITY)
Troponin I (High Sensitivity): 17 ng/L (ref <18)
Troponin I (High Sensitivity): 17 ng/L (ref <18)

## 2022-05-11 LAB — ETHANOL: Alcohol, Ethyl (B): <10 mg/dL (ref <10)

## 2022-05-11 LAB — LIPASE, BLOOD: Lipase: 28 U/L (ref 11–51)

## 2022-05-11 MED ORDER — ONDANSETRON 4 MG PO TBDP
4.0000 mg | ORAL_TABLET | Freq: Once | ORAL | Status: AC | PRN
  Administered 2022-05-11: 4 mg via ORAL
  Filled 2022-05-11: qty 1

## 2022-05-11 NOTE — ED Notes (Signed)
Assisted patient to BR.  Needs one assist.  Urine sent.

## 2022-05-11 NOTE — ED Notes (Signed)
Patient transported to CT 

## 2022-05-11 NOTE — ED Triage Notes (Signed)
Pt reports multiple falls over the last few days while at home. Reports ambulates with walker at baseline. States falls have been mixture between mechanical and d/t dizziness. Reports lives alone at home. Pt denies h/a or neck pain. Pt is alert and oriented following commands.   Pt also reports abd pain with n/v x 2 days.   Pt breathing unlabored speaking in full sentences. Emesis noted in triage.

## 2022-05-11 NOTE — Discharge Instructions (Addendum)
As we discussed please limit your alcohol intake as this could be contributing to your falls.  Please follow-up with your doctor at the [place] as soon as you are able.  Return to the emergency department for any other significant fall, or any symptom personally concerning to yourself.

## 2022-05-11 NOTE — ED Notes (Signed)
Pt called friend and they will pick up pt. Pt in wheelchair, taken to lobby. First nurse aware.

## 2022-05-11 NOTE — ED Provider Triage Note (Signed)
Emergency Medicine Provider Triage Evaluation Note  Hector Miller , a 76 y.o. male  was evaluated in triage.  Pt complains of falls due to dizziness.  Abdominal pain.  Review of Systems  Positive: Fall, dizziness, abdominal pain Negative: Chest pain, shortness of breath  Physical Exam  BP (!) 130/92 (BP Location: Left Arm)   Pulse (!) 123   Temp 98.4 F (36.9 C) (Oral)   Resp 20   Ht 6' (1.829 m)   Wt 90.7 kg   SpO2 94%   BMI 27.12 kg/m  Gen:   Awake, no distress   Resp:  Normal effort  MSK:   Moves extremities without difficulty  Other:  Irregularly irregular rhythm  Medical Decision Making  Medically screening exam initiated at 1:54 AM.  Appropriate orders placed.  Hector Miller was informed that the remainder of the evaluation will be completed by another provider, this initial triage assessment does not replace that evaluation, and the importance of remaining in the ED until their evaluation is complete.  76 year old male presenting with recurrent falls secondary to dizziness, also abdominal pain.  Will obtain lab work, CT head, chest x-ray while patient awaits treatment room.   Hector Hong, MD 05/11/22 [phone]

## 2022-05-11 NOTE — ED Provider Notes (Signed)
[facility] Provider Note    Event Date/Time   First MD Initiated Contact with Patient 05/11/22 1033     (approximate)  History   Chief Complaint: Abdominal Pain and Fall  HPI  Hector Miller is a 76 y.o. male with a past medical history of arthritis, diabetes, hypertension, hyperlipidemia, presents to the emergency department for falls.  Cording to the patient over the past several days he has had 2 falls.  Patient states he has been falling for the past couple months.  Patient states intermittent dizziness sensations to which she describes more as lightheaded or feeling off balance.  Denies any focal weakness or numbness denies any confusion or difficulty speaking or thinking.  Patient does not believe he hit his head when he fell.  Currently patient states he feels well he has no complaints.  Physical Exam   Triage Vital Signs: ED Triage Vitals [05/11/22 0144]  Enc Vitals Group     BP (!) 130/92     Pulse Rate (!) 123     Resp 20     Temp 98.4 F (36.9 C)     Temp Source Oral     SpO2 94 %     Weight 200 lb (90.7 kg)     Height 6' (1.829 m)     Head Circumference      Peak Flow      Pain Score 5     Pain Loc      Pain Edu?      Excl. in GC?     Most recent vital signs: Vitals:   05/11/22 0631 05/11/22 0820  BP: (!) 121/92 120/84  Pulse: (!) 110 (!) 104  Resp: 20 16  Temp: 98 F (36.7 C)   SpO2: 92% 94%    General: Awake, no distress.  CV:  Good peripheral perfusion.  Regular rate and rhythm around 100 bpm. Resp:  Normal effort.  Equal breath sounds bilaterally.  Abd:  No distention.  Soft, nontender.  No rebound or guarding. Other:  Equal grip strength bilaterally.  No pronator drift.  Cranial nerves intact.  5/5 motor in all extremities.    ED Results / Procedures / Treatments   EKG  EKG viewed and interpreted by myself shows sinus tachycardia 114 bpm with a narrow QRS, normal axis, normal intervals, nonspecific ST  changes  RADIOLOGY  I have reviewed and interpreted the CT head images.  No obvious bleed seen on my evaluation. Radiology is read the CT scan is negative for acute abnormality. Chest x-ray is negative.   MEDICATIONS ORDERED IN ED: Medications  ondansetron (ZOFRAN-ODT) disintegrating tablet 4 mg (4 mg Oral Given 05/11/22 0204)     IMPRESSION / MDM / ASSESSMENT AND PLAN / ED COURSE  I reviewed the triage vital signs and the nursing notes.  Patient's presentation is most consistent with acute presentation with potential threat to life or bodily function.  Patient presents emergency department for multiple falls at home.  Patient lives alone.  Patient's workup in the emergency department overall reassuring.  Patient is awake alert oriented he denies any complaints currently.  CT scan of the head is normal, chest x-ray is reassuring, CBC shows no concerning findings, chemistry shows mild hyperglycemia otherwise no concerning findings.  Lipase is normal, troponin negative x 2.  Patient does admit to daily liquor use, states he will drink 1/5 of liquor over 3 days or so.  States he often starts drinking in the morning.  Mild tachycardia likely mild withdrawals today.  I discussed the patient's workup, he wishes to go home which I believe is reasonable given his reassuring workup.  Discussed with the patient follow-up with his doctor at the [place] also to reduce alcohol intake.  Patient agreeable to plan of care.  Discussed return precautions.  FINAL CLINICAL IMPRESSION(S) / ED DIAGNOSES   Falls Dizziness  Rx / DC Orders   [facility]  Note:  This document was prepared using Dragon voice recognition software and may include unintentional dictation errors.   Minna Antis, MD 05/11/22 1044

## 2022-05-23 NOTE — Progress Notes (Deleted)
Referring Physician:  Center, [facility] [address] [place],  [place] [address]  Primary Physician:  Center, [place]  History of Present Illness: 05/23/2022 Mr. Ender Seccombe has a history of DM, HTN, CAD, alcohol abuse, and hyperlipidemia. History of CABG and valve replacement surgery.   Last seen by me on 04/26/22 for recheck of L1 compression fracture that was worse since fall on 03/25/22. Xrays at last visit showed stable fracture.   He was to keep wearing his TLSO brace. No bending, twisting, or lifting.   He is here for rechreck and repeat xrays. He was to contact [place] regarding ultram refills. He was to follow up with PCP/HH regarding his regular medications as well.          Was seen admitted to [facility] on 03/25/22 for severe sepsis after he came to ED after he fell out of bed at [facility]. He was seen by both Dr. Tamala Julian and Dr. Izora Ribas while in the hospital for L1 compression fracture. He was to wear brace when out of bed.   He was seen back in ED on 04/11/22 after another fall.   He is here for follow up.   He continues with constant LBP with no leg pain. He is not wearing his brace- he left it at home. He states that he left the SNF and is now at home. He has no numbness, tingling, weakness in his legs. He has history of chronic LBP since the [employer], but it has been worse since his fall in November.   He was given oxycodone and ultram when he left the facility. He is not sure what medications he is taking and does not know if he needs refills.   Review of Systems:  A 10 point review of systems is negative, except for the pertinent positives and negatives detailed in the HPI.  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   albuterol (VENTOLIN HFA) 108 (90 Base) MCG/ACT inhaler Inhale 2 puffs into the lungs every 6 (six) hours as needed for wheezing or shortness of breath.   aspirin 81 MG chewable tablet Chew 1 tablet (81 mg total) by mouth daily.   cholecalciferol (VITAMIN D) 25 MCG (1000 UNIT) tablet Take 1,000 Units by mouth daily.   finasteride (PROSCAR) 5 MG tablet Take 5 mg by mouth daily.   folic acid (FOLVITE) 1 MG tablet Take 1 mg by mouth daily.   gabapentin (NEURONTIN) 300 MG capsule Take 300 mg by mouth 3 (three) times daily.   metFORMIN (GLUCOPHAGE) 500 MG tablet  Take 1,000 mg by mouth 2 (two) times daily with a meal.   methocarbamol (ROBAXIN) 500 MG tablet Take 1 tablet (500 mg total) by mouth every 8 (eight) hours as needed for muscle spasms.   metoprolol tartrate (LOPRESSOR) 25 MG tablet Take 0.5 tablets (12.5 mg total) by mouth 2 (two) times daily.   mirtazapine (REMERON) 30 MG tablet Take 30 mg by mouth at bedtime.   Omega-3 Fatty Acids (FISH OIL) 1000 MG CAPS Take 1,000 mg by mouth 2 (two) times daily.   oxyCODONE (OXY IR/ROXICODONE) 5 MG immediate release tablet Take 1-2 tablets (5-10 mg total) by mouth every 6 (six) hours as needed for moderate pain or severe pain.   pantoprazole (PROTONIX) 40 MG tablet Take 40 mg by mouth daily.   polyethylene glycol (MIRALAX / GLYCOLAX) 17 g packet Take 17 g by mouth daily as needed  for mild constipation.   tamsulosin (FLOMAX) 0.4 MG CAPS capsule Take 2 capsules (0.8 mg total) by mouth daily.   thiamine (VITAMIN B-1) 100 MG tablet Take 100 mg by mouth daily.   No facility-administered encounter medications on file as of 05/26/2022.    Social History: Social History   Tobacco Use   Smoking status: Former    Types: Cigarettes, Cigars, Pipe    Quit date: 03/26/1970    Years since quitting: 52.1   Smokeless tobacco: Former  Scientific laboratory technician Use: Never used  Substance Use Topics   Alcohol use: Yes    Alcohol/week: 0.0 standard drinks of alcohol    Comment: daily-5-6 shots of liquor   Drug use: No    Family Medical History: Family History  Problem Relation Age of Onset   Cancer Mother        Breast   Parkinson's disease Mother    Heart failure Father    Cancer Father        Stomach   Stroke Father    Hypertension Father     Physical Examination: There were no vitals filed for this visit.    Awake, alert, oriented to person, place, and time.  Speech is clear and fluent.   Cranial Nerves: Pupils equal round and reactive to light.  Facial tone is symmetric.    ROM of TL spine not tested.   He has diffuse mid lumbar tenderness.   No abnormal lesions on exposed skin.   Strength: Side Biceps Triceps Deltoid Interossei Grip Wrist Ext. Wrist Flex.  R 5 5 5 5 5 5 5  $ L 5 5 5 5 5 5 5   $ Side Iliopsoas Quads Hamstring PF DF EHL  R 5 5 5 5 5 5  $ L 5 5 5 5 5 5   $ Reflexes are 2+ and symmetric at the biceps, triceps, brachioradialis, patella and achilles.   Hoffman's is absent.  Clonus is not present.   Bilateral upper and lower extremity sensation is intact to light touch.     He ambulates with a walker.    Medical Decision Making  Imaging: Lumbar xrays dated ***:  ***Note made of transitional anatomy (has 6 lumbar vertebrea). Stable compression fracture L1 in comparison to previous imaging (lumbar MRI dated 03/27/22 and thoracic CT dated  03/27/22).   Above xray report not available for my review.   Assessment and Plan: Mr. Boltz is a pleasant 77 y.o. male with constant LBP since fall on 03/25/22. No leg pain. History of chronic LBP x years which as been worse since fall.   He  states he does not know what medications he is taking or what medications he has at home.   Xrays from today show transitional anatomy with stable L1 compression fracture.   Treatment options discussed with patient and following plan made:   - He will start wearing his TLSO brace (he has not been wearing). Can remove to sleep.  - No bending, twisting, or lifting.  - Discussed fracture will take 3 months to heal. We did discuss possible kyphoplasty if pain is not improving. He wants to see how he does in next month. Since fracture is stable on xrays from today, I agree with this.  - Looks like he was given ultram and oxycodone on 04/08/22- assuming this is when he left facility.  - We discussed refill of ultram for pain. He wants to get all medications from the [place]. He will contact his PCP at the [place].  - We discussed at length that he needs to taking his regular medications. He states home health was coming out today and he will discuss with them. I also advised him to call his PCP to be sure he has the correct medications.  - Follow up in 4 weeks with repeat lumbar xrays (AP/lat).   I spent a total of 30 minutes in face-to-face and non-face-to-face activities related to this patient's care toda including review of outside records, review of imaging, review of symptoms, physical exam, discussion of differential diagnosis, discussion of treatment options, and documentation.    Geronimo Boot PA-C Dept. of Neurosurgery

## 2022-05-25 IMAGING — DX DG CHEST 1V PORT
1 series · 1 of 1 positions shown · non-contrast
Comparison: 03/26/2013

CLINICAL DATA: Dizziness for 2 years. Chest pain for 2 days. Nausea
and vomiting.

EXAM:
PORTABLE CHEST 1 VIEW

[chest ap]
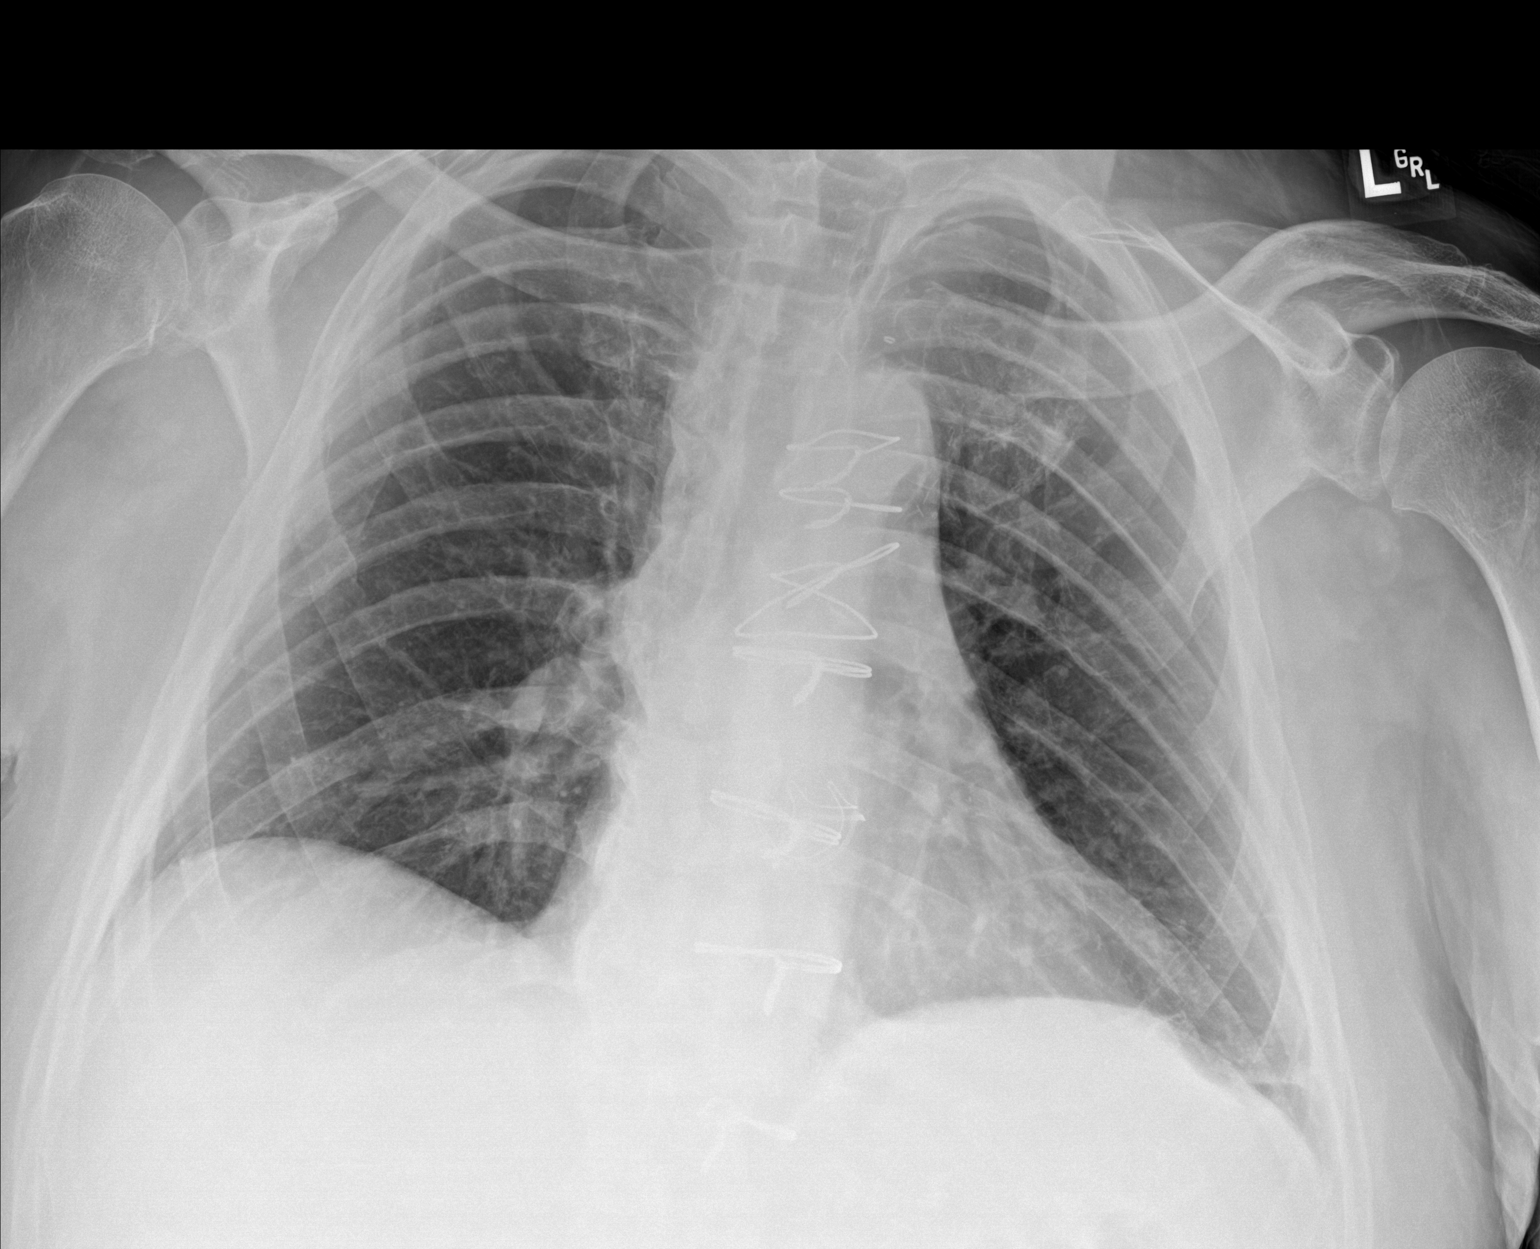

[1 of 1 positions shown; findings below may reference images not displayed]

FINDINGS: Postoperative changes in the mediastinum. Cardiac valve prosthesis.
Heart size and pulmonary vascularity are normal. Lungs are clear. No
pleural effusions. No pneumothorax. Linear atelectasis in the left
base. Mediastinal contours appear intact. Calcification of the
aorta.
IMPRESSION: No active disease.

## 2022-05-26 ENCOUNTER — Ambulatory Visit: Payer: Medicare PPO | Admitting: Orthopedic Surgery

## 2022-06-16 ENCOUNTER — Encounter: Payer: Self-pay | Admitting: *Deleted

## 2022-06-16 ENCOUNTER — Inpatient Hospital Stay
Admission: EM | Admit: 2022-06-16 | Discharge: 2022-06-24 | DRG: 085 | Disposition: A | Discharge status: Skilled Nursing Facility | Payer: No Typology Code available for payment source | Attending: Osteopathic Medicine | Admitting: Osteopathic Medicine

## 2022-06-16 ENCOUNTER — Emergency Department: Payer: No Typology Code available for payment source

## 2022-06-16 ENCOUNTER — Other Ambulatory Visit: Payer: Self-pay

## 2022-06-16 DIAGNOSIS — Z8249 Family history of ischemic heart disease and other diseases of the circulatory system: Secondary | ICD-10-CM

## 2022-06-16 DIAGNOSIS — R296 Repeated falls: Secondary | ICD-10-CM | POA: Diagnosis present

## 2022-06-16 DIAGNOSIS — Z800 Family history of malignant neoplasm of digestive organs: Secondary | ICD-10-CM

## 2022-06-16 DIAGNOSIS — Z952 Presence of prosthetic heart valve: Secondary | ICD-10-CM

## 2022-06-16 DIAGNOSIS — Z823 Family history of stroke: Secondary | ICD-10-CM

## 2022-06-16 DIAGNOSIS — Z66 Do not resuscitate: Secondary | ICD-10-CM | POA: Diagnosis present

## 2022-06-16 DIAGNOSIS — S065X0A Traumatic subdural hemorrhage without loss of consciousness, initial encounter: Secondary | ICD-10-CM | POA: Diagnosis present

## 2022-06-16 DIAGNOSIS — W1830XA Fall on same level, unspecified, initial encounter: Secondary | ICD-10-CM | POA: Diagnosis present

## 2022-06-16 DIAGNOSIS — E1110 Type 2 diabetes mellitus with ketoacidosis without coma: Secondary | ICD-10-CM | POA: Diagnosis present

## 2022-06-16 DIAGNOSIS — F1090 Alcohol use, unspecified, uncomplicated: Secondary | ICD-10-CM | POA: Insufficient documentation

## 2022-06-16 DIAGNOSIS — S066X0A Traumatic subarachnoid hemorrhage without loss of consciousness, initial encounter: Principal | ICD-10-CM | POA: Diagnosis present

## 2022-06-16 DIAGNOSIS — F1010 Alcohol abuse, uncomplicated: Secondary | ICD-10-CM | POA: Diagnosis present

## 2022-06-16 DIAGNOSIS — E8729 Other acidosis: Secondary | ICD-10-CM | POA: Diagnosis present

## 2022-06-16 DIAGNOSIS — I2510 Atherosclerotic heart disease of native coronary artery without angina pectoris: Secondary | ICD-10-CM | POA: Diagnosis present

## 2022-06-16 DIAGNOSIS — E785 Hyperlipidemia, unspecified: Secondary | ICD-10-CM | POA: Diagnosis present

## 2022-06-16 DIAGNOSIS — W19XXXA Unspecified fall, initial encounter: Secondary | ICD-10-CM

## 2022-06-16 DIAGNOSIS — I609 Nontraumatic subarachnoid hemorrhage, unspecified: Secondary | ICD-10-CM | POA: Diagnosis not present

## 2022-06-16 DIAGNOSIS — I10 Essential (primary) hypertension: Secondary | ICD-10-CM | POA: Diagnosis present

## 2022-06-16 DIAGNOSIS — S065XAA Traumatic subdural hemorrhage with loss of consciousness status unknown, initial encounter: Secondary | ICD-10-CM

## 2022-06-16 DIAGNOSIS — E119 Type 2 diabetes mellitus without complications: Secondary | ICD-10-CM | POA: Diagnosis present

## 2022-06-16 DIAGNOSIS — Z1152 Encounter for screening for COVID-19: Secondary | ICD-10-CM

## 2022-06-16 DIAGNOSIS — Z87891 Personal history of nicotine dependence: Secondary | ICD-10-CM

## 2022-06-16 DIAGNOSIS — Y92009 Unspecified place in unspecified non-institutional (private) residence as the place of occurrence of the external cause: Secondary | ICD-10-CM

## 2022-06-16 DIAGNOSIS — Z751 Person awaiting admission to adequate facility elsewhere: Secondary | ICD-10-CM

## 2022-06-16 DIAGNOSIS — Z820 Family history of epilepsy and other diseases of the nervous system: Secondary | ICD-10-CM

## 2022-06-16 DIAGNOSIS — G894 Chronic pain syndrome: Secondary | ICD-10-CM | POA: Diagnosis present

## 2022-06-16 LAB — CBC
HCT: 43.8 % (ref 39.0–52.0)
Hemoglobin: 14.6 g/dL (ref 13.0–17.0)
MCH: 33.1 pg (ref 26.0–34.0)
MCHC: 33.3 g/dL (ref 30.0–36.0)
MCV: 99.3 fL (ref 80.0–100.0)
Platelets: 121 10*3/uL — ABNORMAL LOW (ref 150–400)
RBC: 4.41 MIL/uL (ref 4.22–5.81)
RDW: 14.9 % (ref 11.5–15.5)
WBC: 7.9 10*3/uL (ref 4.0–10.5)
nRBC: 0 % (ref 0.0–0.2)

## 2022-06-16 LAB — RESP PANEL BY RT-PCR (RSV, FLU A&B, COVID)  RVPGX2
Influenza A by PCR: NEGATIVE
Influenza B by PCR: NEGATIVE
Resp Syncytial Virus by PCR: NEGATIVE
SARS Coronavirus 2 by RT PCR: NEGATIVE

## 2022-06-16 LAB — BLOOD GAS, VENOUS
Acid-Base Excess: 3.5 mmol/L — ABNORMAL HIGH (ref 0.0–2.0)
Bicarbonate: 27.8 mmol/L (ref 20.0–28.0)
O2 Saturation: 49.1 %
Patient temperature: 37
pCO2, Ven: 40 mmHg — ABNORMAL LOW (ref 44–60)
pH, Ven: 7.45 — ABNORMAL HIGH (ref 7.25–7.43)
pO2, Ven: 33 mmHg (ref 32–45)

## 2022-06-16 LAB — BASIC METABOLIC PANEL
Anion gap: 19 — ABNORMAL HIGH (ref 5–15)
BUN: 7 mg/dL — ABNORMAL LOW (ref 8–23)
CO2: 15 mmol/L — ABNORMAL LOW (ref 22–32)
Calcium: 9.2 mg/dL (ref 8.9–10.3)
Chloride: 99 mmol/L (ref 98–111)
Creatinine, Ser: 0.87 mg/dL (ref 0.61–1.24)
GFR, Estimated: >60 mL/min (ref >60)
Glucose, Bld: 178 mg/dL — ABNORMAL HIGH (ref 70–99)
Potassium: 3.7 mmol/L (ref 3.5–5.1)
Sodium: 133 mmol/L — ABNORMAL LOW (ref 135–145)

## 2022-06-16 LAB — CBG MONITORING, ED: Glucose-Capillary: 131 mg/dL — ABNORMAL HIGH (ref 70–99)

## 2022-06-16 LAB — BETA-HYDROXYBUTYRIC ACID: Beta-Hydroxybutyric Acid: 0.96 mmol/L — ABNORMAL HIGH (ref 0.05–0.27)

## 2022-06-16 LAB — TROPONIN I (HIGH SENSITIVITY)
Troponin I (High Sensitivity): 18 ng/L — ABNORMAL HIGH (ref <18)
Troponin I (High Sensitivity): 16 ng/L (ref <18)

## 2022-06-16 LAB — LACTIC ACID, PLASMA
Lactic Acid, Venous: 2.3 mmol/L (ref 0.5–1.9)
Lactic Acid, Venous: 1.6 mmol/L (ref 0.5–1.9)

## 2022-06-16 MED ORDER — INSULIN ASPART 100 UNIT/ML IJ SOLN
6.0000 [IU] | Freq: Once | INTRAMUSCULAR | Status: AC
  Administered 2022-06-16: 6 [IU] via SUBCUTANEOUS
  Filled 2022-06-16: qty 1

## 2022-06-16 MED ORDER — INSULIN ASPART 100 UNIT/ML IJ SOLN: 8.0000 [IU] | Freq: Once | INTRAMUSCULAR | Status: DC

## 2022-06-16 MED ORDER — SODIUM CHLORIDE 0.9 % IV BOLUS
500.0000 mL | Freq: Once | INTRAVENOUS | Status: AC
  Administered 2022-06-16: 500 mL via INTRAVENOUS

## 2022-06-16 NOTE — ED Notes (Signed)
Dr. Quentin Cornwall notified of pts trop of 18

## 2022-06-16 NOTE — ED Triage Notes (Addendum)
Pt to Ed via [facility] from home. Pt has had frequent falls x6 months. Pt reports he gets dizzy before the falls. No LOC. Pt reports he is tired of falling. Pt has been seen before for same with no findings. No deformity or injuries   EMS VS:  HR 91 97% RA 144/82 CBG 280

## 2022-06-16 NOTE — ED Notes (Signed)
Called to [place] /rep:reginald/decision has not been made yet , will cb with info.Marland KitchenMarland KitchenMarland Kitchen

## 2022-06-16 NOTE — ED Triage Notes (Signed)
Pt reports recent falls.  Pt fell twice today  pt states he feels weak and dizzy.  Pt states his right buttock hurts from the fall.  Pt alert  speech clear.

## 2022-06-16 NOTE — ED Notes (Signed)
called to [facility] per MD Quentin Cornwall /faxed medical information & facesheet/ YSA:YTKZSW stated a doc will call to ED to verify pt acceptance or denial..Marland Kitchen

## 2022-06-16 NOTE — ED Notes (Signed)
pt accepted to [facility] per MD Robinson/Dr.Kumar.Marland KitchenMarland Kitchen

## 2022-06-16 NOTE — ED Provider Notes (Signed)
[facility] Provider Note    Event Date/Time   First MD Initiated Contact with Patient 06/16/22 1944     (approximate)   History   Fall   HPI  Hector Miller is a 77 y.o. male with a history of diabetes, CAD hypertension presents to the ER for evaluation of frequent falls.  Lives in apartment.  States has been having frequent falls increasing worsening over the past many months.  States that he did fall twice today thinks that he did hit his head.  Denies any neck pain.  Denies any chest pain or shortness of breath.  States that happens when he stands up quickly.  No recent medication changes.  No fevers.          Physical Exam   Triage Vital Signs: ED Triage Vitals [06/16/22 1847]  Enc Vitals Group     BP (!) 153/86     Pulse Rate 97     Resp 20     Temp 98.3 F (36.8 C)     Temp Source Oral     SpO2 95 %     Weight 200 lb (90.7 kg)     Height 6' (1.829 m)     Head Circumference      Peak Flow      Pain Score 9     Pain Loc      Pain Edu?      Excl. in [place]?     Most recent vital signs: Vitals:   06/16/22 1847 06/16/22 2105  BP: (!) 153/86 134/83  Pulse: 97 88  Resp: 20 17  Temp: 98.3 F (36.8 C) 97.7 F (36.5 C)  SpO2: 95% 98%     Constitutional: Alert  Eyes: Conjunctivae are normal.  Head: Atraumatic. Nose: No congestion/rhinnorhea. Mouth/Throat: Mucous membranes are moist.   Neck: Painless ROM.  Cardiovascular:   Good peripheral circulation. Respiratory: Normal respiratory effort.  No retractions.  Gastrointestinal: Soft and nontender.  Musculoskeletal:  no deformity Neurologic:  MAE spontaneously. No gross focal neurologic deficits are appreciated.  Skin:  Skin is warm, dry and intact. No rash noted. Psychiatric: Mood and affect are normal. Speech and behavior are normal.    ED Results / Procedures / Treatments   Labs (all labs ordered are listed, but only abnormal results are displayed) Labs Reviewed  BASIC  METABOLIC PANEL - Abnormal; Notable for the following components:      Result Value   Sodium 133 (*)    CO2 15 (*)    Glucose, Bld 178 (*)    BUN 7 (*)    Anion gap 19 (*)    All other components within normal limits  CBC - Abnormal; Notable for the following components:   Platelets 121 (*)    All other components within normal limits  BLOOD GAS, VENOUS - Abnormal; Notable for the following components:   pH, Ven 7.45 (*)    pCO2, Ven 40 (*)    Acid-Base Excess 3.5 (*)    All other components within normal limits  BETA-HYDROXYBUTYRIC ACID - Abnormal; Notable for the following components:   Beta-Hydroxybutyric Acid 0.96 (*)    All other components within normal limits  LACTIC ACID, PLASMA - Abnormal; Notable for the following components:   Lactic Acid, Venous 2.3 (*)    All other components within normal limits  CBG MONITORING, ED - Abnormal; Notable for the following components:   Glucose-Capillary 131 (*)    All other components  within normal limits  TROPONIN I (HIGH SENSITIVITY) - Abnormal; Notable for the following components:   Troponin I (High Sensitivity) 18 (*)    All other components within normal limits  RESP PANEL BY RT-PCR (RSV, FLU A&B, COVID)  [id number]  LACTIC ACID, PLASMA  URINALYSIS, ROUTINE W REFLEX MICROSCOPIC  TROPONIN I (HIGH SENSITIVITY)     EKG  ED ECG REPORT I, Merlyn Lot, the attending physician, personally viewed and interpreted this ECG.   Date: 06/16/2022  EKG Time: 18:52  Rate: 95  Rhythm: sinus  Axis: normal  Intervals: normal  ST&T Change: no stemi, no depressions    RADIOLOGY Please see ED Course for my review and interpretation.  I personally reviewed all radiographic images ordered to evaluate for the above acute complaints and reviewed radiology reports and findings.  These findings were personally discussed with the patient.  Please see medical record for radiology report.    PROCEDURES:  Critical Care performed:    .Critical Care  Performed by: Merlyn Lot, MD Authorized by: Merlyn Lot, MD   Critical care provider statement:    Critical care time (minutes):  35   Critical care was necessary to treat or prevent imminent or life-threatening deterioration of the following conditions:  Trauma   Critical care was time spent personally by me on the following activities:  Ordering and performing treatments and interventions, ordering and review of laboratory studies, ordering and review of radiographic studies, pulse oximetry, re-evaluation of patient's condition, review of old charts, obtaining history from patient or surrogate, examination of patient, evaluation of patient's response to treatment, discussions with primary provider, discussions with consultants and development of treatment plan with patient or surrogate    MEDICATIONS ORDERED IN ED: Medications  sodium chloride 0.9 % bolus 500 mL (500 mLs Intravenous New Bag/Given 06/16/22 2158)  insulin aspart (novoLOG) injection 6 Units (6 Units Subcutaneous Given 06/16/22 2313)  sodium chloride 0.9 % bolus 500 mL (500 mLs Intravenous New Bag/Given 06/16/22 2318)     IMPRESSION / MDM / [place] / ED COURSE  I reviewed the triage vital signs and the nursing notes.                              Differential diagnosis includes, but is not limited to, dehydration, electrolyte abnormality, SDH, IPH, fracture, orthostasis, anemia, DKA  Patient presenting to the ER for evaluation of symptoms as described above.  Based on symptoms, risk factors and considered above differential, this presenting complaint could reflect a potentially life-threatening illness therefore the patient will be placed on continuous pulse oximetry and telemetry for monitoring.  Laboratory evaluation will be sent to evaluate for the above complaints.  Will order imaging for the above differential.    Clinical Course as of 06/16/22 2343  Thu Jun 16, 2022  2053 CT  imaging shows evidence of acute subdural and subarachnoid without shift.  Patient is not on any anticoagulation or blood thinners.  He is neuro intact.  Discussed case in consultation with neurosurgery does recommend 6-hour repeat CT imaging to evaluate for stability but otherwise very low risk.  Given his frequent falls and his he lives at home alone now with subdural think he is likely going to require hospitalization.  He has requested transfer to the [place]. [PR]  2204 Lactate is mildly elevated to have ordered IV fluids.  Troponin not consistent with acute ischemia.  Still waiting beta-hydroxybutyrate as well as  lactate.  Will add on urinalysis as well as COVID. [PR]  2230 Patient's VBG without acidemia..  She he is not on any medications on review of the [place] medical record that would precipitate euglycemic diabetic ketoacidosis.  May simply have some starvation ketosis.  Still awaiting urinalysis. [PR]  2259 Discussed case with the [facility].  They are evaluating bed capacity for possible acceptance.  He is on a S GLT 2 medication he is borderline for euglycemic DKA will give IV fluids which she has received as well as insulin.  He is not acidotic.  Hospitalist agrees with this plan.  No indication for drip at this time. [PR]  2330 Discussed case with Dr. Dwyane Dee at the [facility].  They will accept the patient pending repeat CT head is stable. [PR]    Clinical Course User Index [PR] Merlyn Lot, MD    FINAL CLINICAL IMPRESSION(S) / ED DIAGNOSES   Final diagnoses:  Fall, initial encounter  SDH (subdural hematoma) ([place])  SAH (subarachnoid hemorrhage) ([place])     Rx / DC Orders   ED Discharge Orders     None        Note:  This document was prepared using Dragon voice recognition software and may include unintentional dictation errors.    Merlyn Lot, MD 06/16/22 [phone]

## 2022-06-17 ENCOUNTER — Emergency Department: Payer: No Typology Code available for payment source

## 2022-06-17 DIAGNOSIS — Z8249 Family history of ischemic heart disease and other diseases of the circulatory system: Secondary | ICD-10-CM | POA: Diagnosis not present

## 2022-06-17 DIAGNOSIS — F1090 Alcohol use, unspecified, uncomplicated: Secondary | ICD-10-CM | POA: Insufficient documentation

## 2022-06-17 DIAGNOSIS — W1830XA Fall on same level, unspecified, initial encounter: Secondary | ICD-10-CM | POA: Diagnosis present

## 2022-06-17 DIAGNOSIS — W19XXXA Unspecified fall, initial encounter: Secondary | ICD-10-CM

## 2022-06-17 DIAGNOSIS — I10 Essential (primary) hypertension: Secondary | ICD-10-CM | POA: Diagnosis present

## 2022-06-17 DIAGNOSIS — S066X0A Traumatic subarachnoid hemorrhage without loss of consciousness, initial encounter: Secondary | ICD-10-CM | POA: Diagnosis present

## 2022-06-17 DIAGNOSIS — E785 Hyperlipidemia, unspecified: Secondary | ICD-10-CM | POA: Diagnosis present

## 2022-06-17 DIAGNOSIS — S065X0A Traumatic subdural hemorrhage without loss of consciousness, initial encounter: Secondary | ICD-10-CM | POA: Diagnosis present

## 2022-06-17 DIAGNOSIS — Z87891 Personal history of nicotine dependence: Secondary | ICD-10-CM | POA: Diagnosis not present

## 2022-06-17 DIAGNOSIS — E119 Type 2 diabetes mellitus without complications: Secondary | ICD-10-CM | POA: Diagnosis present

## 2022-06-17 DIAGNOSIS — E1110 Type 2 diabetes mellitus with ketoacidosis without coma: Secondary | ICD-10-CM | POA: Diagnosis present

## 2022-06-17 DIAGNOSIS — S065XAA Traumatic subdural hemorrhage with loss of consciousness status unknown, initial encounter: Secondary | ICD-10-CM

## 2022-06-17 DIAGNOSIS — Z751 Person awaiting admission to adequate facility elsewhere: Secondary | ICD-10-CM | POA: Diagnosis not present

## 2022-06-17 DIAGNOSIS — I609 Nontraumatic subarachnoid hemorrhage, unspecified: Secondary | ICD-10-CM

## 2022-06-17 DIAGNOSIS — Z1152 Encounter for screening for COVID-19: Secondary | ICD-10-CM | POA: Diagnosis not present

## 2022-06-17 DIAGNOSIS — G894 Chronic pain syndrome: Secondary | ICD-10-CM | POA: Diagnosis present

## 2022-06-17 DIAGNOSIS — Z952 Presence of prosthetic heart valve: Secondary | ICD-10-CM | POA: Diagnosis not present

## 2022-06-17 DIAGNOSIS — Z66 Do not resuscitate: Secondary | ICD-10-CM | POA: Diagnosis present

## 2022-06-17 DIAGNOSIS — Z823 Family history of stroke: Secondary | ICD-10-CM | POA: Diagnosis not present

## 2022-06-17 DIAGNOSIS — R296 Repeated falls: Secondary | ICD-10-CM | POA: Diagnosis present

## 2022-06-17 DIAGNOSIS — I2510 Atherosclerotic heart disease of native coronary artery without angina pectoris: Secondary | ICD-10-CM | POA: Diagnosis present

## 2022-06-17 DIAGNOSIS — Z820 Family history of epilepsy and other diseases of the nervous system: Secondary | ICD-10-CM | POA: Diagnosis not present

## 2022-06-17 DIAGNOSIS — Y92009 Unspecified place in unspecified non-institutional (private) residence as the place of occurrence of the external cause: Secondary | ICD-10-CM | POA: Diagnosis not present

## 2022-06-17 DIAGNOSIS — E8729 Other acidosis: Secondary | ICD-10-CM | POA: Diagnosis not present

## 2022-06-17 DIAGNOSIS — F1010 Alcohol abuse, uncomplicated: Secondary | ICD-10-CM | POA: Diagnosis present

## 2022-06-17 DIAGNOSIS — Z800 Family history of malignant neoplasm of digestive organs: Secondary | ICD-10-CM | POA: Diagnosis not present

## 2022-06-17 LAB — URINALYSIS, ROUTINE W REFLEX MICROSCOPIC
Bacteria, UA: NONE SEEN
Bilirubin Urine: NEGATIVE
Glucose, UA: >=500 mg/dL — AB
Hgb urine dipstick: NEGATIVE
Ketones, ur: 5 mg/dL — AB
Leukocytes,Ua: NEGATIVE
Nitrite: NEGATIVE
Protein, ur: NEGATIVE mg/dL
Specific Gravity, Urine: 1.023 (ref 1.005–1.030)
Squamous Epithelial / HPF: NONE SEEN /HPF (ref 0–5)
pH: 6 (ref 5.0–8.0)

## 2022-06-17 LAB — BASIC METABOLIC PANEL
Anion gap: 12 (ref 5–15)
BUN: 6 mg/dL — ABNORMAL LOW (ref 8–23)
CO2: 23 mmol/L (ref 22–32)
Calcium: 8.5 mg/dL — ABNORMAL LOW (ref 8.9–10.3)
Chloride: 101 mmol/L (ref 98–111)
Creatinine, Ser: 0.72 mg/dL (ref 0.61–1.24)
GFR, Estimated: >60 mL/min (ref >60)
Glucose, Bld: 110 mg/dL — ABNORMAL HIGH (ref 70–99)
Potassium: 3.6 mmol/L (ref 3.5–5.1)
Sodium: 136 mmol/L (ref 135–145)

## 2022-06-17 LAB — CK: Total CK: 137 U/L (ref 49–397)

## 2022-06-17 LAB — CBG MONITORING, ED
Glucose-Capillary: 96 mg/dL (ref 70–99)
Glucose-Capillary: 108 mg/dL — ABNORMAL HIGH (ref 70–99)
Glucose-Capillary: 145 mg/dL — ABNORMAL HIGH (ref 70–99)

## 2022-06-17 LAB — GLUCOSE, CAPILLARY
Glucose-Capillary: 142 mg/dL — ABNORMAL HIGH (ref 70–99)
Glucose-Capillary: 188 mg/dL — ABNORMAL HIGH (ref 70–99)

## 2022-06-17 MED ORDER — FOLIC ACID 1 MG PO TABS: 1.0000 mg | ORAL_TABLET | Freq: Every day | ORAL | Status: DC

## 2022-06-17 MED ORDER — ACETAMINOPHEN 650 MG RE SUPP: 650.0000 mg | Freq: Four times a day (QID) | RECTAL | Status: DC | PRN

## 2022-06-17 MED ORDER — INSULIN ASPART 100 UNIT/ML IJ SOLN
0.0000 [IU] | Freq: Three times a day (TID) | INTRAMUSCULAR | Status: DC
  Administered 2022-06-17 (×2): 2 [IU] via SUBCUTANEOUS
  Administered 2022-06-18: 5 [IU] via SUBCUTANEOUS
  Administered 2022-06-18: 2 [IU] via SUBCUTANEOUS
  Administered 2022-06-18 – 2022-06-19 (×2): 3 [IU] via SUBCUTANEOUS
  Administered 2022-06-19 (×2): 5 [IU] via SUBCUTANEOUS
  Administered 2022-06-20: 3 [IU] via SUBCUTANEOUS
  Administered 2022-06-20: 5 [IU] via SUBCUTANEOUS
  Administered 2022-06-20: 2 [IU] via SUBCUTANEOUS
  Administered 2022-06-21: 3 [IU] via SUBCUTANEOUS
  Administered 2022-06-21: 5 [IU] via SUBCUTANEOUS
  Administered 2022-06-21 – 2022-06-22 (×3): 3 [IU] via SUBCUTANEOUS
  Administered 2022-06-22 – 2022-06-23 (×2): 2 [IU] via SUBCUTANEOUS
  Administered 2022-06-23: 5 [IU] via SUBCUTANEOUS
  Administered 2022-06-23 – 2022-06-24 (×2): 2 [IU] via SUBCUTANEOUS
  Filled 2022-06-17 (×20): qty 1

## 2022-06-17 MED ORDER — PANTOPRAZOLE SODIUM 40 MG PO TBEC
40.0000 mg | DELAYED_RELEASE_TABLET | Freq: Every day | ORAL | Status: DC
  Administered 2022-06-17 – 2022-06-24 (×8): 40 mg via ORAL
  Filled 2022-06-17 (×8): qty 1

## 2022-06-17 MED ORDER — MIRTAZAPINE 15 MG PO TABS
30.0000 mg | ORAL_TABLET | Freq: Every day | ORAL | Status: DC
  Administered 2022-06-17 – 2022-06-23 (×7): 30 mg via ORAL
  Filled 2022-06-17 (×7): qty 2

## 2022-06-17 MED ORDER — METOPROLOL TARTRATE 25 MG PO TABS
12.5000 mg | ORAL_TABLET | Freq: Two times a day (BID) | ORAL | Status: DC
  Administered 2022-06-17 – 2022-06-24 (×15): 12.5 mg via ORAL
  Filled 2022-06-17 (×15): qty 1

## 2022-06-17 MED ORDER — THIAMINE HCL 100 MG/ML IJ SOLN: 100.0000 mg | Freq: Every day | INTRAMUSCULAR | Status: DC

## 2022-06-17 MED ORDER — ACETAMINOPHEN 325 MG PO TABS
650.0000 mg | ORAL_TABLET | Freq: Four times a day (QID) | ORAL | Status: DC | PRN
  Administered 2022-06-18: 650 mg via ORAL
  Filled 2022-06-17: qty 2

## 2022-06-17 MED ORDER — SODIUM CHLORIDE 0.9 % IV SOLN: INTRAVENOUS | Status: DC

## 2022-06-17 MED ORDER — FOLIC ACID 1 MG PO TABS
1.0000 mg | ORAL_TABLET | Freq: Every day | ORAL | Status: DC
  Administered 2022-06-17 – 2022-06-24 (×8): 1 mg via ORAL
  Filled 2022-06-17 (×8): qty 1

## 2022-06-17 MED ORDER — ASPIRIN 81 MG PO CHEW
81.0000 mg | CHEWABLE_TABLET | Freq: Every day | ORAL | Status: DC
  Administered 2022-06-17 – 2022-06-24 (×8): 81 mg via ORAL
  Filled 2022-06-17 (×8): qty 1

## 2022-06-17 MED ORDER — VITAMIN D 25 MCG (1000 UNIT) PO TABS
1000.0000 [IU] | ORAL_TABLET | Freq: Every day | ORAL | Status: DC
  Administered 2022-06-17 – 2022-06-24 (×8): 1000 [IU] via ORAL
  Filled 2022-06-17 (×8): qty 1

## 2022-06-17 MED ORDER — OXYCODONE HCL 5 MG PO TABS
5.0000 mg | ORAL_TABLET | Freq: Four times a day (QID) | ORAL | Status: DC | PRN
  Administered 2022-06-18 – 2022-06-19 (×3): 5 mg via ORAL
  Filled 2022-06-17 (×3): qty 1
  Filled 2022-06-17: qty 2

## 2022-06-17 MED ORDER — LORAZEPAM 1 MG PO TABS: 1.0000 mg | ORAL_TABLET | ORAL | Status: AC | PRN

## 2022-06-17 MED ORDER — LORAZEPAM 2 MG/ML IJ SOLN: 1.0000 mg | INTRAMUSCULAR | Status: AC | PRN

## 2022-06-17 MED ORDER — ONDANSETRON HCL 4 MG/2ML IJ SOLN: 4.0000 mg | Freq: Four times a day (QID) | INTRAMUSCULAR | Status: DC | PRN

## 2022-06-17 MED ORDER — ONDANSETRON HCL 4 MG PO TABS: 4.0000 mg | ORAL_TABLET | Freq: Four times a day (QID) | ORAL | Status: DC | PRN

## 2022-06-17 MED ORDER — FINASTERIDE 5 MG PO TABS
5.0000 mg | ORAL_TABLET | Freq: Every day | ORAL | Status: DC
  Administered 2022-06-17 – 2022-06-24 (×8): 5 mg via ORAL
  Filled 2022-06-17 (×8): qty 1

## 2022-06-17 MED ORDER — INSULIN ASPART 100 UNIT/ML IJ SOLN: 0.0000 [IU] | Freq: Every day | INTRAMUSCULAR | Status: DC

## 2022-06-17 MED ORDER — POLYETHYLENE GLYCOL 3350 17 G PO PACK: 17.0000 g | PACK | Freq: Every day | ORAL | Status: DC | PRN

## 2022-06-17 MED ORDER — GABAPENTIN 300 MG PO CAPS
300.0000 mg | ORAL_CAPSULE | Freq: Three times a day (TID) | ORAL | Status: DC
  Administered 2022-06-17 – 2022-06-23 (×18): 300 mg via ORAL
  Filled 2022-06-17 (×18): qty 1

## 2022-06-17 MED ORDER — THIAMINE MONONITRATE 100 MG PO TABS
100.0000 mg | ORAL_TABLET | Freq: Every day | ORAL | Status: DC
  Administered 2022-06-17 – 2022-06-24 (×8): 100 mg via ORAL
  Filled 2022-06-17 (×8): qty 1

## 2022-06-17 MED ORDER — OMEGA-3-ACID ETHYL ESTERS 1 G PO CAPS
1.0000 g | ORAL_CAPSULE | Freq: Every day | ORAL | Status: DC
  Administered 2022-06-17 – 2022-06-24 (×8): 1 g via ORAL
  Filled 2022-06-17 (×8): qty 1

## 2022-06-17 MED ORDER — ADULT MULTIVITAMIN W/MINERALS CH
1.0000 | ORAL_TABLET | Freq: Every day | ORAL | Status: DC
  Administered 2022-06-17 – 2022-06-24 (×8): 1 via ORAL
  Filled 2022-06-17 (×8): qty 1

## 2022-06-17 MED ORDER — TAMSULOSIN HCL 0.4 MG PO CAPS
0.8000 mg | ORAL_CAPSULE | Freq: Every day | ORAL | Status: DC
  Administered 2022-06-17 – 2022-06-24 (×8): 0.8 mg via ORAL
  Filled 2022-06-17 (×8): qty 2

## 2022-06-17 MED ORDER — THIAMINE MONONITRATE 100 MG PO TABS
100.0000 mg | ORAL_TABLET | Freq: Every day | ORAL | Status: DC
  Administered 2022-06-17: 100 mg via ORAL
  Filled 2022-06-17: qty 1

## 2022-06-17 MED ORDER — METHOCARBAMOL 500 MG PO TABS: 500.0000 mg | ORAL_TABLET | Freq: Three times a day (TID) | ORAL | Status: DC | PRN

## 2022-06-17 NOTE — ED Notes (Signed)
Report given to floor RN. Pt being transported at this time.

## 2022-06-17 NOTE — Assessment & Plan Note (Signed)
Continue aspirin and metoprolol 

## 2022-06-17 NOTE — ED Provider Notes (Signed)
Patient received in signout from Dr. Quentin Cornwall pending repeat CT head to evaluate stability of small subdural after recurrent falls.  Plan of care was to transfer to the [place] as long as his repeat study was stable.  This repeat CT is reviewed by me and does not demonstrate any significant progression of ICH.  Looks stable.  We called back to the [place], but transfer was subsequently denied despite Dr. Ruffin Frederick previous conversations as they are now currently at capacity and on diversion.  I therefore consulted hospitalist here and accepted the patient for admission.  I update the patient of this and he is in agreement.  .Critical Care  Performed by: Vladimir Crofts, MD Authorized by: Vladimir Crofts, MD   Critical care provider statement:    Critical care time (minutes):  30   Critical care time was exclusive of:  Separately billable procedures and treating other patients   Critical care was necessary to treat or prevent imminent or life-threatening deterioration of the following conditions:  CNS failure or compromise   Critical care was time spent personally by me on the following activities:  Development of treatment plan with patient or surrogate, discussions with consultants, evaluation of patient's response to treatment, examination of patient, ordering and review of laboratory studies, ordering and review of radiographic studies, ordering and performing treatments and interventions, pulse oximetry, re-evaluation of patient's condition and review of old charts     Vladimir Crofts, MD 06/17/22 [phone]

## 2022-06-17 NOTE — ED Notes (Signed)
Pt eating lunch right now

## 2022-06-17 NOTE — Assessment & Plan Note (Signed)
Continue home metoprolol ?

## 2022-06-17 NOTE — H&P (Signed)
History and Physical    Patient: Hector Miller YHC:[id number] DOB: [birth year] DOA: 06/16/2022 DOS: the patient was seen and examined on 06/17/2022 PCP: [facility] [place]  Patient coming from: Home  Chief Complaint:  Chief Complaint  Patient presents with   Fall    HPI: Hector Miller is a 77 y.o. male with medical history significant for coronary artery disease, diabetes mellitus, hypertension, dyslipidemia, alcohol use disorder, depression, chronic low back pain, who presents to the ED following a fall at home where he lives alone.  Workup in the ED was significant for an acute subdural and subarachnoid hemorrhage without shift and he was also noted to have a high anion gap metabolic acidosis of uncertain etiology.  The ED provider spoke with neurosurgeon, Dr. Cari Caraway who recommended a 6-hour repeat CT.  Due to concerns for safe discharge hospitalization was requested at the [facility] and they agreed to accept patient once follow-up CT in 6 hours was stable.  Repeat CT in 6 hours showed stable findings however by this time there was no bed availability at the [facility] and hospital admission was subsequently requested.  During his time in the ED, patient was hydrated for his metabolic acidosis which was believed either related to euglycemic diabetic ketoacidosis or starvation ketosis.  BMP not yet rechecked.  Hospitalist consulted for admission.   Review of Systems: As mentioned in the history of present illness. All other systems reviewed and are negative.  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 His smoking use included cigarettes, cigars, and pipe. He has quit using smokeless tobacco. He reports current alcohol use. He reports that he does not use drugs.  Allergies  Allergen Reactions   Lisinopril Cough    Family History  Problem Relation Age of Onset   Cancer Mother        Breast   Parkinson's disease Mother    Heart failure Father    Cancer Father        Stomach   Stroke Father    Hypertension Father     Prior to Admission medications   Medication Sig Start Date End Date Taking? Authorizing Provider  acetaminophen (TYLENOL) 325 MG tablet Take 2 tablets (650 mg total) by mouth every 6 (six) hours as needed for mild pain, fever or headache (or Fever >/= 101). 04/01/22   Emeterio Reeve, DO  albuterol (VENTOLIN HFA) 108 (90 Base) MCG/ACT inhaler Inhale 2 puffs into the lungs every 6 (six) hours as needed for wheezing or shortness of breath. 05/19/21   Kayleen Memos, DO  aspirin 81 MG chewable tablet Chew 1 tablet (81 mg total) by mouth daily. 04/01/22   Emeterio Reeve, DO  cholecalciferol (VITAMIN D) 25 MCG (1000 UNIT) tablet Take 1,000 Units by mouth daily.    [provider]  finasteride (PROSCAR) 5 MG tablet Take 5 mg by mouth daily.    [provider]  folic acid (FOLVITE) 1 MG tablet Take 1 mg by mouth daily.    [provider]  gabapentin (NEURONTIN) 300 MG capsule Take 300 mg by mouth 3 (three) times daily.    [provider]  metFORMIN (GLUCOPHAGE) 500 MG tablet Take 1,000 mg by mouth 2 (two) times daily with a meal.    [provider]  methocarbamol (ROBAXIN) 500 MG tablet Take 1 tablet (500 mg total) by mouth every 8 (eight) hours as needed for muscle spasms. 04/01/22   Emeterio Reeve, DO  metoprolol tartrate (LOPRESSOR) 25 MG tablet Take 0.5 tablets (12.5 mg total) by mouth 2 (two) times daily. 04/01/22   Emeterio Reeve, DO  mirtazapine  (REMERON) 30 MG tablet Take 30 mg by mouth at bedtime.    [provider]  Omega-3 Fatty Acids (FISH OIL) 1000 MG CAPS Take 1,000 mg by mouth 2 (two) times daily.    [provider]  oxyCODONE (OXY IR/ROXICODONE) 5 MG immediate release tablet Take 1-2 tablets (5-10 mg total) by mouth every 6 (six) hours as needed for moderate pain or severe pain. 04/01/22   Emeterio Reeve, DO  pantoprazole (PROTONIX) 40 MG tablet Take 40 mg by mouth daily.    [provider]  polyethylene glycol (MIRALAX / GLYCOLAX) 17 g packet Take 17 g by mouth daily as needed for mild constipation. 04/01/22   Emeterio Reeve, DO  tamsulosin (FLOMAX) 0.4 MG CAPS capsule Take 2 capsules (0.8 mg total) by mouth daily. 04/01/22   Emeterio Reeve, DO  thiamine (VITAMIN B-1) 100 MG tablet Take 100 mg by mouth daily.    [provider]    Physical Exam: Vitals:   06/16/22 2330 06/17/22 0000 06/17/22 0200 06/17/22 0300  BP: (!) 143/80 131/77 (!) 143/74 (!) 142/77  Pulse: 88 90 82 77  Resp: 20 17 18  (!) 23  Temp:      TempSrc:      SpO2: 98% 100% 96% 97%  Weight:      Height:       Physical Exam Vitals and nursing note reviewed.  Constitutional:      General: He is not in acute distress. HENT:     Head: Normocephalic and atraumatic.  Cardiovascular:     Rate and Rhythm: Normal rate and regular rhythm.     Heart sounds: Normal heart sounds.  Pulmonary:     Effort: Pulmonary effort is normal.     Breath sounds: Normal breath sounds.  Abdominal:     Palpations: Abdomen is soft.     Tenderness: There is no abdominal tenderness.  Neurological:     Mental Status: Mental status is at baseline.     Labs on Admission: I have personally reviewed following labs and imaging studies  CBC: Recent Labs  Lab 06/16/22 1850  WBC 7.9  HGB 14.6  HCT 43.8  MCV 99.3  PLT 350*   Basic Metabolic Panel: Recent Labs  Lab 06/16/22 1850  NA 133*  K 3.7  CL 99  CO2 15*  GLUCOSE  178*  BUN 7*  CREATININE 0.87  CALCIUM 9.2   GFR: Estimated Creatinine Clearance: 78 mL/min (by C-G formula based on SCr of 0.87 mg/dL). Liver Function Tests: No results for input(s): "AST", "ALT", "ALKPHOS", "BILITOT", "PROT", "ALBUMIN" in the last 168 hours. No results for input(s): "LIPASE", "AMYLASE" in the last 168 hours. No results for input(s): "AMMONIA" in the last 168 hours. Coagulation Profile: No results for input(s): "INR", "PROTIME" in the  last 168 hours. Cardiac Enzymes: No results for input(s): "CKTOTAL", "CKMB", "CKMBINDEX", "TROPONINI" in the last 168 hours. BNP (last 3 results) No results for input(s): "PROBNP" in the last 8760 hours. HbA1C: No results for input(s): "HGBA1C" in the last 72 hours. CBG: Recent Labs  Lab 06/16/22 2316 06/17/22 0334  GLUCAP 131* 96   Lipid Profile: No results for input(s): "CHOL", "HDL", "LDLCALC", "TRIG", "CHOLHDL", "LDLDIRECT" in the last 72 hours. Thyroid Function Tests: No results for input(s): "TSH", "T4TOTAL", "FREET4", "T3FREE", "THYROIDAB" in the last 72 hours. Anemia Panel: No results for input(s): "VITAMINB12", "FOLATE", "FERRITIN", "TIBC", "IRON", "RETICCTPCT" in the last 72 hours. Urine analysis:    Component Value Date/Time   COLORURINE YELLOW (A) 06/17/2022 0141   APPEARANCEUR CLEAR (A) 06/17/2022 0141   LABSPEC 1.023 06/17/2022 0141   PHURINE 6.0 06/17/2022 0141   GLUCOSEU >=500 (A) 06/17/2022 0141   HGBUR NEGATIVE 06/17/2022 0141   BILIRUBINUR NEGATIVE 06/17/2022 0141   KETONESUR 5 (A) 06/17/2022 0141   PROTEINUR NEGATIVE 06/17/2022 0141   UROBILINOGEN 0.2 03/26/2013 1219   NITRITE NEGATIVE 06/17/2022 0141   LEUKOCYTESUR NEGATIVE 06/17/2022 0141    Radiological Exams on Admission: CT HEAD WO CONTRAST (5MM)  Result Date: 06/17/2022 CLINICAL DATA:  Intracranial hemorrhage follow up EXAM: CT HEAD WITHOUT CONTRAST TECHNIQUE: Contiguous axial images were obtained from the base of the skull through the vertex  without intravenous contrast. RADIATION DOSE REDUCTION: This exam was performed according to the departmental dose-optimization program which includes automated exposure control, adjustment of the mA and/or kV according to patient size and/or use of iterative reconstruction technique. COMPARISON:  06/16/2022 FINDINGS: Brain: Unchanged pattern of para falcine subdural and subarachnoid blood. No midline shift or other mass effect. There is periventricular hypoattenuation compatible with chronic microvascular disease. Vascular: No abnormal hyperdensity of the major intracranial arteries or dural venous sinuses. No intracranial atherosclerosis. Skull: The visualized skull base, calvarium and extracranial soft tissues are normal. Sinuses/Orbits: No fluid levels or advanced mucosal thickening of the visualized paranasal sinuses. No mastoid or middle ear effusion. The orbits are normal. IMPRESSION: Unchanged pattern of parafalcine subdural and subarachnoid blood. No midline shift or other mass effect. Electronically Signed   By: Ulyses Jarred M.D.   On: 06/17/2022 03:19   CT HEAD WO CONTRAST (5MM)  Result Date: 06/16/2022 CLINICAL DATA:  Multiple falls, dizziness EXAM: CT HEAD WITHOUT CONTRAST TECHNIQUE: Contiguous axial images were obtained from the base of the skull through the vertex without intravenous contrast. RADIATION DOSE REDUCTION: This exam was performed according to the departmental dose-optimization program which includes automated exposure control, adjustment of the mA and/or kV according to patient size and/or use of iterative reconstruction technique. COMPARISON:  05/11/2022 FINDINGS: Brain: There is a falcine subdural hematoma measuring up to 4 mm at the vertex. There is associated subarachnoid hemorrhage within the sulci along the bilateral frontal and parietal convexities. No evidence of acute infarct. The lateral ventricles and remaining midline structures are unremarkable. No mass effect. Vascular:  Stable atherosclerosis.  No hyperdense vessel. Skull: Normal. Negative for fracture or focal lesion. Sinuses/Orbits: No acute finding. Other: None. IMPRESSION: 1. 4 mm falcine subdural hematoma, with associated subarachnoid hemorrhage within the parafalcine sulci along the bilateral frontal and parietal convexities. 2. No mass effect or midline shift. 3. No acute infarct. Critical Value/emergent results were called by telephone at the time of interpretation on 06/16/2022 at 8:44 pm to provider Merlyn Lot , who verbally acknowledged these results. Electronically Signed   By: Randa Ngo  M.D.   On: 06/16/2022 20:45   CT Cervical Spine Wo Contrast  Result Date: 06/16/2022 CLINICAL DATA:  Multiple falls, dizziness EXAM: CT CERVICAL SPINE WITHOUT CONTRAST TECHNIQUE: Multidetector CT imaging of the cervical spine was performed without intravenous contrast. Multiplanar CT image reconstructions were also generated. RADIATION DOSE REDUCTION: This exam was performed according to the departmental dose-optimization program which includes automated exposure control, adjustment of the mA and/or kV according to patient size and/or use of iterative reconstruction technique. COMPARISON:  03/25/2022 FINDINGS: Alignment: Alignment is grossly anatomic. Skull base and vertebrae: No acute fracture. No primary bone lesion or focal pathologic process. Soft tissues and spinal canal: No prevertebral fluid or swelling. No visible canal hematoma. Disc levels: Stable mild spondylosis and right predominant facet hypertrophy at C3-4, with right greater than left neural foraminal encroachment. Remaining disc spaces are relatively well preserved. Upper chest: Airway is patent.  Lung apices are clear. Other: Reconstructed images demonstrate no additional findings. IMPRESSION: 1. No acute cervical spine fracture. 2. 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 Electronically Signed   By: Sharlet Salina M.D.   On: 06/16/2022 20:42   DG  Chest 2 View  Result Date: 06/16/2022 CLINICAL DATA:  Weakness EXAM: CHEST - 2 VIEW COMPARISON:  Chest x-ray 05/11/2022 FINDINGS: Sternotomy wires and heart valve are again seen. The heart size and mediastinal contours are within normal limits. Both lungs are clear. The visualized skeletal structures are unremarkable. IMPRESSION: No active cardiopulmonary disease. Electronically Signed   By: Darliss Cheney M.D.   On: 06/16/2022 19:38     Data Reviewed: Relevant notes from primary care and specialist visits, past discharge summaries as available in EHR, including Care Everywhere. Prior diagnostic testing as pertinent to current admission diagnoses Updated medications and problem lists for reconciliation ED course, including vitals, labs, imaging, treatment and response to treatment Triage notes, nursing and pharmacy notes and ED provider's notes Notable results as noted in HPI   Assessment and Plan: Subdural hematoma (HCC) Fall at home CT scan showed 4 mm falcine subdural hematoma, with associated subarachnoid hemorrhage without shift, unchanged after 6 hours Fall precautions PT eval  High anion gap metabolic acidosis Hydrated in the ED.  Etiology uncertain Continue IV hydration  Will get repeat BMP as well as CK to evaluate for rhabdo  Alcohol use disorder CIWA withdrawal protocol  Diabetes mellitus without complication (HCC) Sliding scale insulin coverage  Coronary artery disease Continue aspirin and metoprolol  Chronic pain syndrome Continue gabapentin, methocarbamol and oxycodone with fall precautions  HTN (hypertension) Continue home metoprolol        DVT prophylaxis: SCD  Consults: none  Advance Care Planning:   Code Status: Prior   Family Communication: none  Disposition Plan: Back to previous home environment  Severity of Illness: The appropriate patient status for this patient is OBSERVATION. Observation status is judged to be reasonable and necessary  in order to provide the required intensity of service to ensure the patient's safety. The patient's presenting symptoms, physical exam findings, and initial radiographic and laboratory data in the context of their medical condition is felt to place them at decreased risk for further clinical deterioration. Furthermore, it is anticipated that the patient will be medically stable for discharge from the hospital within 2 midnights of admission.   Author: Andris Baumann, MD 06/17/2022 4:32 AM  For on call review [email or web address].

## 2022-06-17 NOTE — Assessment & Plan Note (Signed)
Hydrated in the ED.  Etiology uncertain Continue IV hydration  Will get repeat BMP as well as CK to evaluate for rhabdo

## 2022-06-17 NOTE — Assessment & Plan Note (Signed)
Sliding scale insulin coverage 

## 2022-06-17 NOTE — Evaluation (Signed)
Physical Therapy Evaluation Patient Details Name: Hector Miller MRN: [id number] DOB: 05-30-1945 Today's Date: 06/17/2022  History of Present Illness  Hector Miller is a 77 y.o. male with medical history significant for coronary artery disease, diabetes mellitus, hypertension, dyslipidemia, alcohol use disorder, depression, chronic low back pain, who presents to the ED following a fall at home where he lives alone.  Workup in the ED was significant for an acute subdural and subarachnoid hemorrhage without shift and he was also noted to have a high anion gap metabolic acidosis of uncertain etiology.   Clinical Impression  Patient received on stretcher. He is quite forthcoming with information and reports he falls a lot due to drinking daily. Patient lives alone, has friends that check in on him. Patient required mod A with bed mobility and mod A to stand from stretcher with cues for technique. He is unable to walk at this time due to dizziness and only tolerated standing briefly. Patient will continue to benefit from skilled PT to improve safety and independence. Patient is unsafe to return home alone. If he cannot set up 24/7 caregiver he will likely need SNF.          Recommendations for follow up therapy are one component of a multi-disciplinary discharge planning process, led by the attending physician.  Recommendations may be updated based on patient status, additional functional criteria and insurance authorization.  Follow Up Recommendations Skilled nursing-short term rehab (<3 hours/day) Can patient physically be transported by private vehicle: No    Assistance Recommended at Discharge Frequent or constant Supervision/Assistance  Patient can return home with the following  A lot of help with walking and/or transfers;A lot of help with bathing/dressing/bathroom;Help with stairs or ramp for entrance;Assist for transportation;Assistance with cooking/housework    Equipment  Recommendations None recommended by PT  Recommendations for Other Services  OT consult    Functional Status Assessment Patient has had a recent decline in their functional status and demonstrates the ability to make significant improvements in function in a reasonable and predictable amount of time.     Precautions / Restrictions Precautions Precautions: Fall Restrictions Weight Bearing Restrictions: No      Mobility  Bed Mobility Overal bed mobility: Needs Assistance Bed Mobility: Supine to Sit, Sit to Supine     Supine to sit: Mod assist Sit to supine: Mod assist   General bed mobility comments: due to back pain    Transfers Overall transfer level: Needs assistance Equipment used: Rolling walker (2 wheels) Transfers: Sit to/from Stand Sit to Stand: Mod assist           General transfer comment: cues for safe technique with sit to stand. Pushing up from bed, "nose over toes". Only able to stand briefly due to reported dizziness and requested to sit back down.    Ambulation/Gait               General Gait Details: unable due to reported dizziness with standing  Stairs            Wheelchair Mobility    Modified Rankin (Stroke Patients Only)       Balance Overall balance assessment: Needs assistance, History of Falls Sitting-balance support: Feet supported Sitting balance-Leahy Scale: Good     Standing balance support: Bilateral upper extremity supported, During functional activity, Reliant on assistive device for balance Standing balance-Leahy Scale: Poor Standing balance comment: dizzy, frequent falls  Pertinent Vitals/Pain Pain Assessment Pain Assessment: Faces Faces Pain Scale: Hurts little more Pain Location: back-chronic Pain Descriptors / Indicators: Discomfort, Sore, Grimacing Pain Intervention(s): Monitored during session, Repositioned    Home Living Family/patient expects to be discharged  to:: Skilled nursing facility Living Arrangements: Alone                 Additional Comments: States he lives in apartment alone, multiple falls    Prior Function Prior Level of Function : Independent/Modified Independent;Driving;History of Falls (last six months)             Mobility Comments: Mod I with rollator for household ambulation and SPC for limited community ambulation. ADLs Comments: Mod I with ADL's, simple meals, driving, IADL's, and medication management. [facility] provide 1 meal a day but pt prefers to make his own simple meals/eat out.     Hand Dominance   Dominant Hand: Right    Extremity/Trunk Assessment   Upper Extremity Assessment Upper Extremity Assessment: Overall WFL for tasks assessed    Lower Extremity Assessment Lower Extremity Assessment: Generalized weakness    Cervical / Trunk Assessment Cervical / Trunk Assessment:  (back pain)  Communication   Communication: No difficulties  Cognition Arousal/Alertness: Awake/alert Behavior During Therapy: WFL for tasks assessed/performed Overall Cognitive Status: Within Functional Limits for tasks assessed                                          General Comments      Exercises     Assessment/Plan    PT Assessment Patient needs continued PT services  PT Problem List Decreased strength;Decreased activity tolerance;Decreased balance;Decreased mobility;Decreased safety awareness       PT Treatment Interventions Gait training;Therapeutic exercise;Balance training;Functional mobility training;Therapeutic activities;Patient/family education    PT Goals (Current goals can be found in the Care Plan section)  Acute Rehab PT Goals Patient Stated Goal: wants to go home with caregiver if can get set up by [place]. PT Goal Formulation: With patient Time For Goal Achievement: 07/01/22 Potential to Achieve Goals: Fair    Frequency Min 2X/week     Co-evaluation                AM-PAC PT "6 Clicks" Mobility  Outcome Measure Help needed turning from your back to your side while in a flat bed without using bedrails?: A Lot Help needed moving from lying on your back to sitting on the side of a flat bed without using bedrails?: A Lot Help needed moving to and from a bed to a chair (including a wheelchair)?: A Lot Help needed standing up from a chair using your arms (e.g., wheelchair or bedside chair)?: A Lot Help needed to walk in hospital room?: A Lot Help needed climbing 3-5 steps with a railing? : Total 6 Click Score: 11    End of Session Equipment Utilized During Treatment: Gait belt Activity Tolerance: Other (comment) (dizziness) Patient left: in bed Nurse Communication: Mobility status PT Visit Diagnosis: Other abnormalities of gait and mobility (R26.89);Unsteadiness on feet (R26.81);Repeated falls (R29.6);Muscle weakness (generalized) (M62.81);Difficulty in walking, not elsewhere classified (R26.2);Dizziness and giddiness (R42)    Time: 6503-5465 PT Time Calculation (min) (ACUTE ONLY): 21 min   Charges:   PT Evaluation $PT Eval Moderate Complexity: 1 Mod PT Treatments $Therapeutic Activity: 8-22 mins        Eugen Jeansonne, PT, GCS 06/17/22,1:00 PM

## 2022-06-17 NOTE — IPAL (Signed)
  Interdisciplinary Goals of Care Family Meeting   Date carried out: 06/17/2022  Location of the meeting: Bedside  Member's involved: Physician  Durable Power of Attorney or acting medical decision maker: patient    Discussion: We discussed goals of care for [employer] .   I have reviewed medical records including EPIC notes, labs and imaging, assessed the patient and then met with patient to discuss major active diagnoses, plan of care, natural trajectory, prognosis, GOC, EOL wishes, disposition and options including Full code/DNI/DNR and the concept of comfort care if DNR is elected. Questions and concerns were addressed. They are  in agreement to continue current plan of care . Election to continue prior DNR status.   Code status:   Code Status: DNR   Disposition: Continue current acute care  Time spent for the meeting: Clay, MD  06/17/2022, 5:27 AM

## 2022-06-17 NOTE — ED Notes (Signed)
[facility] & stated they will not be able to accepted pt at this time due to them going on diversion @ 1130 .Marland Kitchen

## 2022-06-17 NOTE — Assessment & Plan Note (Signed)
CIWA withdrawal protocol

## 2022-06-17 NOTE — Assessment & Plan Note (Signed)
Fall at home CT scan showed 4 mm falcine subdural hematoma, with associated subarachnoid hemorrhage without shift, unchanged after 6 hours Fall precautions PT eval

## 2022-06-17 NOTE — Progress Notes (Signed)
[place] at [place] NAME: Hector Miller    MR#:  [id number]  DATE OF BIRTH:  Jun 15, 1945  SUBJECTIVE:   Patient seen earlier. No family at bedside. Lives at home alone. Has been having frequent falls with dizziness. Each TV dinner meals. Drinks alcohol hard liquor on a daily basis. No family at bedside. Patient awake alert oriented times three. Does not use walker or cane at home.   VITALS:  Blood pressure 124/73, pulse (!) 101, temperature 98.7 F (37.1 C), resp. rate 20, height 6' (1.829 m), weight 90.7 kg, SpO2 100 %.  PHYSICAL EXAMINATION:   GENERAL:  77 y.o.-year-old patient with no acute distress.  LUNGS: Normal breath sounds bilaterally, no wheezing CARDIOVASCULAR: S1, S2 normal. No murmur   ABDOMEN: Soft, nontender, nondistended. Bowel sounds present.  EXTREMITIES: No  edema b/l.    NEUROLOGIC: nonfocal  patient is alert and awake SKIN: No obvious rash, lesion, or ulcer.   LABORATORY PANEL:  CBC Recent Labs  Lab 06/16/22 1850  WBC 7.9  HGB 14.6  HCT 43.8  PLT 121*    Chemistries  Recent Labs  Lab 06/17/22 0608  NA 136  K 3.6  CL 101  CO2 23  GLUCOSE 110*  BUN 6*  CREATININE 0.72  CALCIUM 8.5*   Cardiac Enzymes No results for input(s): "TROPONINI" in the last 168 hours. RADIOLOGY:  CT HEAD WO CONTRAST (5MM)  Result Date: 06/17/2022 CLINICAL DATA:  Intracranial hemorrhage follow up EXAM: CT HEAD WITHOUT CONTRAST TECHNIQUE: Contiguous axial images were obtained from the base of the skull through the vertex without intravenous contrast. RADIATION DOSE REDUCTION: This exam was performed according to the departmental dose-optimization program which includes automated exposure control, adjustment of the mA and/or kV according to patient size and/or use of iterative reconstruction technique. COMPARISON:  06/16/2022 FINDINGS: Brain: Unchanged pattern of para falcine subdural and subarachnoid blood. No midline shift or  other mass effect. There is periventricular hypoattenuation compatible with chronic microvascular disease. Vascular: No abnormal hyperdensity of the major intracranial arteries or dural venous sinuses. No intracranial atherosclerosis. Skull: The visualized skull base, calvarium and extracranial soft tissues are normal. Sinuses/Orbits: No fluid levels or advanced mucosal thickening of the visualized paranasal sinuses. No mastoid or middle ear effusion. The orbits are normal. IMPRESSION: Unchanged pattern of parafalcine subdural and subarachnoid blood. No midline shift or other mass effect. Electronically Signed   By: Ulyses Jarred M.D.   On: 06/17/2022 03:19   CT HEAD WO CONTRAST (5MM)  Result Date: 06/16/2022 CLINICAL DATA:  Multiple falls, dizziness EXAM: CT HEAD WITHOUT CONTRAST TECHNIQUE: Contiguous axial images were obtained from the base of the skull through the vertex without intravenous contrast. RADIATION DOSE REDUCTION: This exam was performed according to the departmental dose-optimization program which includes automated exposure control, adjustment of the mA and/or kV according to patient size and/or use of iterative reconstruction technique. COMPARISON:  05/11/2022 FINDINGS: Brain: There is a falcine subdural hematoma measuring up to 4 mm at the vertex. There is associated subarachnoid hemorrhage within the sulci along the bilateral frontal and parietal convexities. No evidence of acute infarct. The lateral ventricles and remaining midline structures are unremarkable. No mass effect. Vascular: Stable atherosclerosis.  No hyperdense vessel. Skull: Normal. Negative for fracture or focal lesion. Sinuses/Orbits: No acute finding. Other: None. IMPRESSION: 1. 4 mm falcine subdural hematoma, with associated subarachnoid hemorrhage within the parafalcine sulci along the bilateral frontal and parietal convexities. 2. No mass effect  or midline shift. 3. No acute infarct. Critical Value/emergent results were  called by telephone at the time of interpretation on 06/16/2022 at 8:44 pm to provider Merlyn Lot , who verbally acknowledged these results. Electronically Signed   By: Randa Ngo M.D.   On: 06/16/2022 20:45   CT Cervical Spine Wo Contrast  Result Date: 06/16/2022 CLINICAL DATA:  Multiple falls, dizziness EXAM: CT CERVICAL SPINE WITHOUT CONTRAST TECHNIQUE: Multidetector CT imaging of the cervical spine was performed without intravenous contrast. Multiplanar CT image reconstructions were also generated. RADIATION DOSE REDUCTION: This exam was performed according to the departmental dose-optimization program which includes automated exposure control, adjustment of the mA and/or kV according to patient size and/or use of iterative reconstruction technique. COMPARISON:  03/25/2022 FINDINGS: Alignment: Alignment is grossly anatomic. Skull base and vertebrae: No acute fracture. No primary bone lesion or focal pathologic process. Soft tissues and spinal canal: No prevertebral fluid or swelling. No visible canal hematoma. Disc levels: Stable mild spondylosis and right predominant facet hypertrophy at C3-4, with right greater than left neural foraminal encroachment. Remaining disc spaces are relatively well preserved. Upper chest: Airway is patent.  Lung apices are clear. Other: Reconstructed images demonstrate no additional findings. IMPRESSION: 1. No acute cervical spine fracture. 2. 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 Electronically Signed   By: Randa Ngo M.D.   On: 06/16/2022 20:42   DG Chest 2 View  Result Date: 06/16/2022 CLINICAL DATA:  Weakness EXAM: CHEST - 2 VIEW COMPARISON:  Chest x-ray 05/11/2022 FINDINGS: Sternotomy wires and heart valve are again seen. The heart size and mediastinal contours are within normal limits. Both lungs are clear. The visualized skeletal structures are unremarkable. IMPRESSION: No active cardiopulmonary disease. Electronically Signed   By: Ronney Asters  M.D.   On: 06/16/2022 19:38    Assessment and Plan  Hector Miller is a 77 y.o. male with medical history significant for coronary artery disease, diabetes mellitus, hypertension, dyslipidemia, alcohol use disorder, depression, chronic low back pain, who presents to the ED following a fall at home where he lives alone.  Workup in the ED was significant for an acute subdural and subarachnoid hemorrhage without shift and he was also noted to have a high anion gap metabolic acidosis of uncertain etiology.     Subdural hematoma (HCC) Fall at home CT scan showed 4 mm falcine subdural hematoma, with associated subarachnoid hemorrhage without shift, unchanged after 6 hours Fall precautions PT eval--STR ED provider spoke with neurosurgeon, Dr. Cari Caraway who recommended a 6-hour repeat CT which is stable. Pt a and ox 3   High anion gap metabolic acidosis Hydrated in the ED.  Etiology uncertain resolved   Alcohol use disorder CIWA withdrawal protocol No s/o WD   Diabetes mellitus without complication (HCC) Sliding scale insulin coverage   Coronary artery disease Continue aspirin and metoprolol   Chronic pain syndrome Continue gabapentin   HTN (hypertension) Continue home metoprolol  Procedures:none Family communication :none Consults :Neurosurgery CODE STATUS: DNR  DVT Prophylaxis :SCD Level of care: Med-Surg Status is: Inpatient Remains inpatient appropriate because: fall TOC for d/c planning    TOTAL TIME TAKING CARE OF THIS PATIENT: 35 minutes.  >50% time spent on counselling and coordination of care  Note: This dictation was prepared with Dragon dictation along with smaller phrase technology. Any transcriptional errors that result from this process are unintentional.  Fritzi Mandes M.D    [facility]   CC: Primary care physician; [facility], [facility]

## 2022-06-17 NOTE — Assessment & Plan Note (Signed)
Continue gabapentin, methocarbamol and oxycodone with fall precautions

## 2022-06-17 NOTE — ED Notes (Signed)
Called to [facility] /rep:Ayoung stated pt is still denied att /repeat CT results needs to be faxed over before a decision could be made..Per MD [facility] doc stated pt was accepted & could come after repeat CT.Marland Kitchen

## 2022-06-18 DIAGNOSIS — E8729 Other acidosis: Secondary | ICD-10-CM

## 2022-06-18 DIAGNOSIS — F1090 Alcohol use, unspecified, uncomplicated: Secondary | ICD-10-CM | POA: Diagnosis not present

## 2022-06-18 DIAGNOSIS — G894 Chronic pain syndrome: Secondary | ICD-10-CM | POA: Diagnosis not present

## 2022-06-18 DIAGNOSIS — S065XAA Traumatic subdural hemorrhage with loss of consciousness status unknown, initial encounter: Secondary | ICD-10-CM | POA: Diagnosis not present

## 2022-06-18 LAB — GLUCOSE, CAPILLARY
Glucose-Capillary: 235 mg/dL — ABNORMAL HIGH (ref 70–99)
Glucose-Capillary: 188 mg/dL — ABNORMAL HIGH (ref 70–99)
Glucose-Capillary: 129 mg/dL — ABNORMAL HIGH (ref 70–99)
Glucose-Capillary: 195 mg/dL — ABNORMAL HIGH (ref 70–99)

## 2022-06-18 LAB — HEMOGLOBIN A1C
Hgb A1c MFr Bld: 6.7 % — ABNORMAL HIGH (ref 4.8–5.6)
Mean Plasma Glucose: 145.59 mg/dL

## 2022-06-18 MED ORDER — METFORMIN HCL 500 MG PO TABS
1000.0000 mg | ORAL_TABLET | Freq: Two times a day (BID) | ORAL | Status: DC
  Administered 2022-06-18 – 2022-06-24 (×12): 1000 mg via ORAL
  Filled 2022-06-18 (×12): qty 2

## 2022-06-18 NOTE — Progress Notes (Signed)
[place] at [place] NAME: Hector Miller    MR#:  [id number]  DATE OF BIRTH:  1945-08-20  SUBJECTIVE:   Patient seen earlier. No family at bedside. Lives at home alone. Has been having frequent falls with dizziness. Each TV dinner meals. Drinks alcohol hard liquor on a daily basis. No family at bedside. Patient awake alert oriented times three. Does not use walker or cane at home.   VITALS:  Blood pressure 103/65, pulse 82, temperature 97.8 F (36.6 C), resp. rate 18, height 6' (1.829 m), weight 90.7 kg, SpO2 96 %.  PHYSICAL EXAMINATION:   GENERAL:  77 y.o.-year-old patient with no acute distress.  LUNGS: Normal breath sounds bilaterally, no wheezing CARDIOVASCULAR: S1, S2 normal. No murmur   ABDOMEN: Soft, nontender, nondistended. Bowel sounds present.  EXTREMITIES: No  edema b/l.    NEUROLOGIC: nonfocal  patient is alert and awake SKIN: No obvious rash, lesion, or ulcer.   LABORATORY PANEL:  CBC Recent Labs  Lab 06/16/22 1850  WBC 7.9  HGB 14.6  HCT 43.8  PLT 121*     Chemistries  Recent Labs  Lab 06/17/22 0608  NA 136  K 3.6  CL 101  CO2 23  GLUCOSE 110*  BUN 6*  CREATININE 0.72  CALCIUM 8.5*    Cardiac Enzymes No results for input(s): "TROPONINI" in the last 168 hours. RADIOLOGY:  CT HEAD WO CONTRAST (5MM)  Result Date: 06/17/2022 CLINICAL DATA:  Intracranial hemorrhage follow up EXAM: CT HEAD WITHOUT CONTRAST TECHNIQUE: Contiguous axial images were obtained from the base of the skull through the vertex without intravenous contrast. RADIATION DOSE REDUCTION: This exam was performed according to the departmental dose-optimization program which includes automated exposure control, adjustment of the mA and/or kV according to patient size and/or use of iterative reconstruction technique. COMPARISON:  06/16/2022 FINDINGS: Brain: Unchanged pattern of para falcine subdural and subarachnoid blood. No midline shift or  other mass effect. There is periventricular hypoattenuation compatible with chronic microvascular disease. Vascular: No abnormal hyperdensity of the major intracranial arteries or dural venous sinuses. No intracranial atherosclerosis. Skull: The visualized skull base, calvarium and extracranial soft tissues are normal. Sinuses/Orbits: No fluid levels or advanced mucosal thickening of the visualized paranasal sinuses. No mastoid or middle ear effusion. The orbits are normal. IMPRESSION: Unchanged pattern of parafalcine subdural and subarachnoid blood. No midline shift or other mass effect. Electronically Signed   By: Ulyses Jarred M.D.   On: 06/17/2022 03:19   CT HEAD WO CONTRAST (5MM)  Result Date: 06/16/2022 CLINICAL DATA:  Multiple falls, dizziness EXAM: CT HEAD WITHOUT CONTRAST TECHNIQUE: Contiguous axial images were obtained from the base of the skull through the vertex without intravenous contrast. RADIATION DOSE REDUCTION: This exam was performed according to the departmental dose-optimization program which includes automated exposure control, adjustment of the mA and/or kV according to patient size and/or use of iterative reconstruction technique. COMPARISON:  05/11/2022 FINDINGS: Brain: There is a falcine subdural hematoma measuring up to 4 mm at the vertex. There is associated subarachnoid hemorrhage within the sulci along the bilateral frontal and parietal convexities. No evidence of acute infarct. The lateral ventricles and remaining midline structures are unremarkable. No mass effect. Vascular: Stable atherosclerosis.  No hyperdense vessel. Skull: Normal. Negative for fracture or focal lesion. Sinuses/Orbits: No acute finding. Other: None. IMPRESSION: 1. 4 mm falcine subdural hematoma, with associated subarachnoid hemorrhage within the parafalcine sulci along the bilateral frontal and parietal convexities. 2. No mass  effect or midline shift. 3. No acute infarct. Critical Value/emergent results were  called by telephone at the time of interpretation on 06/16/2022 at 8:44 pm to provider Merlyn Lot , who verbally acknowledged these results. Electronically Signed   By: Randa Ngo M.D.   On: 06/16/2022 20:45   CT Cervical Spine Wo Contrast  Result Date: 06/16/2022 CLINICAL DATA:  Multiple falls, dizziness EXAM: CT CERVICAL SPINE WITHOUT CONTRAST TECHNIQUE: Multidetector CT imaging of the cervical spine was performed without intravenous contrast. Multiplanar CT image reconstructions were also generated. RADIATION DOSE REDUCTION: This exam was performed according to the departmental dose-optimization program which includes automated exposure control, adjustment of the mA and/or kV according to patient size and/or use of iterative reconstruction technique. COMPARISON:  03/25/2022 FINDINGS: Alignment: Alignment is grossly anatomic. Skull base and vertebrae: No acute fracture. No primary bone lesion or focal pathologic process. Soft tissues and spinal canal: No prevertebral fluid or swelling. No visible canal hematoma. Disc levels: Stable mild spondylosis and right predominant facet hypertrophy at C3-4, with right greater than left neural foraminal encroachment. Remaining disc spaces are relatively well preserved. Upper chest: Airway is patent.  Lung apices are clear. Other: Reconstructed images demonstrate no additional findings. IMPRESSION: 1. No acute cervical spine fracture. 2. 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 Electronically Signed   By: Randa Ngo M.D.   On: 06/16/2022 20:42   DG Chest 2 View  Result Date: 06/16/2022 CLINICAL DATA:  Weakness EXAM: CHEST - 2 VIEW COMPARISON:  Chest x-ray 05/11/2022 FINDINGS: Sternotomy wires and heart valve are again seen. The heart size and mediastinal contours are within normal limits. Both lungs are clear. The visualized skeletal structures are unremarkable. IMPRESSION: No active cardiopulmonary disease. Electronically Signed   By: Ronney Asters  M.D.   On: 06/16/2022 19:38    Assessment and Plan  Hector Miller is a 77 y.o. male with medical history significant for coronary artery disease, diabetes mellitus, hypertension, dyslipidemia, alcohol use disorder, depression, chronic low back pain, who presents to the ED following a fall at home where he lives alone.  Workup in the ED was significant for an acute subdural and subarachnoid hemorrhage without shift and he was also noted to have a high anion gap metabolic acidosis of uncertain etiology.     Subdural hematoma (HCC) Fall at home CT scan showed 4 mm falcine subdural hematoma, with associated subarachnoid hemorrhage without shift, unchanged after 6 hours Fall precautions PT eval--STR ED provider spoke with neurosurgeon, Dr. Cari Caraway who recommended a 6-hour repeat CT which is stable. Pt a and ox 3   High anion gap metabolic acidosis Hydrated in the ED.  Etiology uncertain resolved   Alcohol use disorder CIWA withdrawal protocol No s/o WD   Diabetes mellitus without complication (HCC) Sliding scale insulin coverage   Coronary artery disease Continue aspirin and metoprolol   Chronic pain syndrome Continue gabapentin   HTN (hypertension) Continue home metoprolol  Procedures:none Family communication :none Consults :Neurosurgery CODE STATUS: DNR  DVT Prophylaxis :SCD Level of care: Med-Surg Status is: Inpatient Remains inpatient appropriate because: fall TOC for d/c planning to rehab [facility])    TOTAL TIME TAKING CARE OF THIS PATIENT: 35 minutes.  >50% time spent on counselling and coordination of care  Note: This dictation was prepared with Dragon dictation along with smaller phrase technology. Any transcriptional errors that result from this process are unintentional.  Norville Haggard.D  [facility]   CC: Primary care physician; [place]

## 2022-06-18 NOTE — Evaluation (Addendum)
Occupational Therapy Evaluation Patient Details Name: Hector Miller MRN: [id number] DOB: 26-May-1945 Today's Date: 06/18/2022   History of Present Illness Hector Miller is a 77 y.o. male with medical history significant for coronary artery disease, diabetes mellitus, hypertension, dyslipidemia, alcohol use disorder, depression, chronic low back pain, who presents to the ED following a fall at home where he lives alone.  Workup in the ED was significant for an acute subdural and subarachnoid hemorrhage without shift and he was also noted to have a high anion gap metabolic acidosis of uncertain etiology.   Clinical Impression   Patient presenting with decreased independence in self care, balance, functional mobility/transfers, endurance, and safety awareness. PTA pt lived alone, was Mod I for ADLs/IADLs, and Mod I for functional mobility using rollator or SPC. Pt currently functioning at Max A for bed mobility, Max A for stand pivot transfer from EOB>recliner using RW, Max A for LB dressing, and set up-supervision for seated grooming tasks. Pt will benefit from acute OT to increase overall independence in the areas of ADLs and functional mobility in order to safely discharge to next venue of care. Upon hospital discharge, recommend STR to maximize pt safety and return to PLOF.    Recommendations for follow up therapy are one component of a multi-disciplinary discharge planning process, led by the attending physician.  Recommendations may be updated based on patient status, additional functional criteria and insurance authorization.   Follow Up Recommendations  Skilled nursing-short term rehab (<3 hours/day)     Assistance Recommended at Discharge Frequent or constant Supervision/Assistance  Patient can return home with the following A lot of help with bathing/dressing/bathroom;A lot of help with walking and/or transfers;Assistance with cooking/housework;Assist for transportation;Help with  stairs or ramp for entrance    Functional Status Assessment  Patient has had a recent decline in their functional status and demonstrates the ability to make significant improvements in function in a reasonable and predictable amount of time.  Equipment Recommendations  Other (comment) (defer to next venue of care)    Recommendations for Other Services       Precautions / Restrictions Precautions Precautions: Fall Restrictions Weight Bearing Restrictions: No      Mobility Bed Mobility Overal bed mobility: Needs Assistance Bed Mobility: Supine to Sit     Supine to sit: Max assist, HOB elevated     General bed mobility comments: attempted to instruct pt in log roll technique 2/2 chronic back pain however pt had difficulty coordinating, ultimately requried Max A due to back pain    Transfers Overall transfer level: Needs assistance Equipment used: Rolling walker (2 wheels) Transfers: Bed to chair/wheelchair/BSC   Stand pivot transfers: Max assist, From elevated surface         General transfer comment: VC for safe technique (hand placement, anterior weight shift)      Balance Overall balance assessment: Needs assistance, History of Falls Sitting-balance support: Feet supported Sitting balance-Leahy Scale: Good     Standing balance support: Bilateral upper extremity supported, During functional activity, Reliant on assistive device for balance Standing balance-Leahy Scale: Poor                             ADL either performed or assessed with clinical judgement   ADL Overall ADL's : Needs assistance/impaired     Grooming: Set up;Supervision/safety;Sitting Grooming Details (indicate cue type and reason): anticipate         Upper Body Dressing :  Minimal assistance;Sitting Upper Body Dressing Details (indicate cue type and reason): anticipate Lower Body Dressing: Maximal assistance;Sitting/lateral leans Lower Body Dressing Details (indicate cue  type and reason): socks Toilet Transfer: Stand-pivot;Rolling walker (2 wheels);Maximal assistance;Cueing for safety;Cueing for sequencing Toilet Transfer Details (indicate cue type and reason): simulated Toileting- Clothing Manipulation and Hygiene: Maximal assistance;Sit to/from stand Toileting - Clothing Manipulation Details (indicate cue type and reason): anticipate             Vision Patient Visual Report: No change from baseline       Perception     Praxis      Pertinent Vitals/Pain Pain Assessment Pain Assessment: Faces Faces Pain Scale: Hurts little more Pain Location: back-chronic and headache Pain Descriptors / Indicators: Discomfort, Sore, Grimacing Pain Intervention(s): Limited activity within patient's tolerance, Monitored during session, Repositioned, Patient requesting pain meds-RN notified     Hand Dominance Right   Extremity/Trunk Assessment Upper Extremity Assessment Upper Extremity Assessment: Generalized weakness   Lower Extremity Assessment Lower Extremity Assessment: Generalized weakness       Communication Communication Communication: No difficulties   Cognition Arousal/Alertness: Awake/alert Behavior During Therapy: WFL for tasks assessed/performed Overall Cognitive Status: Within Functional Limits for tasks assessed                                       General Comments  Pt reporting mild dizziness in standing that persisted even after stand pivot transfer to recliner. BP sitting in recliner: 119/77 (MAP 90), HR 83. RN notified    Exercises Other Exercises Other Exercises: OT provided education re: role of OT, OT POC, post acute recs, sitting up for all meals, EOB/OOB mobility with assistance, home/fall safety.     Shoulder Instructions      Home Living Family/patient expects to be discharged to:: Skilled nursing facility Living Arrangements: Alone                               Additional Comments:  States he lives in apartment alone, multiple falls      Prior Functioning/Environment Prior Level of Function : Independent/Modified Independent;Driving;History of Falls (last six months)             Mobility Comments: Mod I with rollator for household ambulation and SPC for limited community ambulation. ADLs Comments: Mod I with ADL's, simple meals, driving, IADL's, and medication management. [facility] ([facility]) provide 1 meal a day but pt prefers to make his own simple meals/eat out.        OT Problem List: Decreased strength;Decreased activity tolerance;Impaired balance (sitting and/or standing);Decreased safety awareness      OT Treatment/Interventions: Self-care/ADL training;Therapeutic exercise;Therapeutic activities;Cognitive remediation/compensation;Energy conservation;DME and/or AE instruction;Patient/family education;Balance training    OT Goals(Current goals can be found in the care plan section) Acute Rehab OT Goals Patient Stated Goal: return home OT Goal Formulation: With patient Time For Goal Achievement: 07/02/22 Potential to Achieve Goals: Fair  OT Frequency: Min 2X/week    Co-evaluation              AM-PAC OT "6 Clicks" Daily Activity     Outcome Measure Help from another person eating meals?: None Help from another person taking care of personal grooming?: A Little Help from another person toileting, which includes using toliet, bedpan, or urinal?: A Lot Help from another person bathing (including washing, rinsing, drying)?:  A Lot Help from another person to put on and taking off regular upper body clothing?: A Little Help from another person to put on and taking off regular lower body clothing?: A Lot 6 Click Score: 16   End of Session Equipment Utilized During Treatment: Gait belt;Rolling walker (2 wheels) Nurse Communication: Mobility status;Patient requests pain meds  Activity Tolerance: Patient limited by pain;Other (comment) (dizziness) Patient  left: in chair;with call bell/phone within reach;with chair alarm set  OT Visit Diagnosis: Unsteadiness on feet (R26.81);History of falling (Z91.81);Pain;Muscle weakness (generalized) (M62.81);Dizziness and giddiness (R42) Pain - part of body:  (back & head)                Time: 3557-3220 OT Time Calculation (min): 22 min Charges:  OT General Charges $OT Visit: 1 Visit OT Evaluation $OT Eval Moderate Complexity: 1 Mod  [facility] MS, OTR/L ascom [phone]  06/18/22, 5:01 PM

## 2022-06-18 NOTE — Progress Notes (Signed)
Patient with complaints of pain to lower back at 0930. Patient states pain is chronic and ongoing. Rated pain 8/10. Oxycodone 5 mg given. Re-assess after 1hour pain decreased to 3/10.

## 2022-06-19 DIAGNOSIS — F1090 Alcohol use, unspecified, uncomplicated: Secondary | ICD-10-CM | POA: Diagnosis not present

## 2022-06-19 DIAGNOSIS — W19XXXA Unspecified fall, initial encounter: Secondary | ICD-10-CM | POA: Diagnosis not present

## 2022-06-19 LAB — GLUCOSE, CAPILLARY
Glucose-Capillary: 243 mg/dL — ABNORMAL HIGH (ref 70–99)
Glucose-Capillary: 197 mg/dL — ABNORMAL HIGH (ref 70–99)
Glucose-Capillary: 179 mg/dL — ABNORMAL HIGH (ref 70–99)
Glucose-Capillary: 175 mg/dL — ABNORMAL HIGH (ref 70–99)

## 2022-06-19 MED ORDER — ACETAMINOPHEN 500 MG PO TABS
1000.0000 mg | ORAL_TABLET | Freq: Three times a day (TID) | ORAL | Status: DC | PRN
  Administered 2022-06-19 – 2022-06-22 (×3): 1000 mg via ORAL
  Filled 2022-06-19 (×3): qty 2

## 2022-06-19 MED ORDER — OXYCODONE HCL 5 MG PO TABS
5.0000 mg | ORAL_TABLET | Freq: Four times a day (QID) | ORAL | Status: DC | PRN
  Administered 2022-06-20 – 2022-06-21 (×2): 5 mg via ORAL
  Filled 2022-06-19 (×2): qty 1

## 2022-06-19 NOTE — NC FL2 (Signed)
[place] LEVEL OF CARE FORM     IDENTIFICATION  Patient Name: Hector Miller Birthdate: 05-25-1945 Sex: adult Admission Date (Current Location): 06/16/2022  [place] and [place] Number:  Engineering geologist and Address:  [facility], [address] [place], [place] [address]      Provider Number: [id number]  Attending Physician Name and Address:  Enzo Bi, MD  Relative Name and Phone Number:  Mays,Eugene Alanson Puls) [phone] Jerilynn Mages    Current Level of Care: Hospital Recommended Level of Care: [place] Prior Approval Number:    Date Approved/Denied:   PASRR Number: [id number]  Discharge Plan: SNF    Current Diagnoses: Patient Active Problem List   Diagnosis Date Noted   Fall at home, initial encounter 06/17/2022   Subdural hematoma ([place]) 06/17/2022   Diabetes mellitus without complication ([place]) 08/67/6195   Alcohol use disorder 06/17/2022   Fall 06/17/2022   Falls 06/17/2022   Fx lumbar vertebra-closed ([place]) 03/28/2022   Radiculopathy 03/28/2022   Acute urinary retention 03/27/2022   Acute low back pain with sciatica 03/27/2022   Sepsis ([place]) 03/26/2022   Alcohol withdrawal syndrome without complication ([place]) 09/32/6712   Dehydration 03/26/2022   SIRS of non-infectious origin w acute organ dysfunction ([place]) 03/25/2022   Elevated troponin 03/25/2022   Traumatic rhabdomyolysis ([place]) 03/25/2022   AKI (acute kidney injury) ([place]) 03/25/2022   Alcohol abuse 03/25/2022   High anion gap metabolic acidosis 45/80/9983   COVID-19 virus infection 05/14/2021   Coronary artery disease 05/14/2021   Type 2 diabetes mellitus with hyperglycemia ([place]) 05/14/2021   Neuropathy 05/14/2021   Dizziness 05/14/2021   Weakness 05/14/2021   Depression 05/14/2021   Spinal stenosis, lumbar region, with neurogenic claudication 02/25/2021   Lumbar facet arthropathy 02/25/2021   Lumbar radiculopathy 02/25/2021   Chronic pain  syndrome 02/25/2021   Gout 12/02/2014   HLD (hyperlipidemia) 12/02/2014   HTN (hypertension) 12/02/2014   Gastroesophageal reflux disease without esophagitis 12/02/2014   BPH (benign prostatic hyperplasia) 12/02/2014   Arthritis of back 12/02/2014   Frequent headaches 12/02/2014    Orientation RESPIRATION BLADDER Height & Weight     Self, Time, Situation, Place  Normal External catheter Weight: 200 lb (90.7 kg) Height:  6' (182.9 cm)  BEHAVIORAL SYMPTOMS/MOOD NEUROLOGICAL BOWEL NUTRITION STATUS        Diet (heart healthy)  AMBULATORY STATUS COMMUNICATION OF NEEDS Skin   Extensive Assist Verbally Normal (Billaterial redness to R arm)                       Personal Care Assistance Level of Assistance  Bathing, Feeding, Dressing Bathing Assistance: Maximum assistance Feeding assistance: Maximum assistance Dressing Assistance: Maximum assistance     Functional Limitations Info  Sight, Hearing, Speech Sight Info: Adequate Hearing Info: Adequate Speech Info: Adequate    SPECIAL CARE FACTORS FREQUENCY  PT (By licensed PT), OT (By licensed OT)     PT Frequency: 5 times a week OT Frequency: 5 times a week            Contractures Contractures Info: Not present    Additional Factors Info  Code Status, Allergies Code Status Info: DNR Allergies Info: Lisinopril           Current Medications (06/19/2022):  This is the current hospital active medication list Current Facility-Administered Medications  Medication Dose Route Frequency Provider Last Rate Last Admin   acetaminophen (TYLENOL) tablet 1,000 mg  1,000 mg Oral TID PRN  Enzo Bi, MD   1,000 mg at 06/19/22 1330   aspirin chewable tablet 81 mg  81 mg Oral Daily Athena Masse, MD   81 mg at 06/19/22 1036   cholecalciferol (VITAMIN D3) 25 MCG (1000 UNIT) tablet 1,000 Units  1,000 Units Oral Daily Fritzi Mandes, MD   1,000 Units at 06/19/22 1035   finasteride (PROSCAR) tablet 5 mg  5 mg Oral Daily Fritzi Mandes, MD    5 mg at 81/27/51 7001   folic acid (FOLVITE) tablet 1 mg  1 mg Oral Daily Judd Gaudier V, MD   1 mg at 06/19/22 1036   gabapentin (NEURONTIN) capsule 300 mg  300 mg Oral TID Fritzi Mandes, MD   300 mg at 06/19/22 1035   insulin aspart (novoLOG) injection 0-15 Units  0-15 Units Subcutaneous TID WC Judd Gaudier V, MD   3 Units at 06/19/22 1358   insulin aspart (novoLOG) injection 0-5 Units  0-5 Units Subcutaneous QHS Athena Masse, MD       LORazepam (ATIVAN) tablet 1-4 mg  1-4 mg Oral Q1H PRN Athena Masse, MD       Or   LORazepam (ATIVAN) injection 1-4 mg  1-4 mg Intravenous Q1H PRN Athena Masse, MD       metFORMIN (GLUCOPHAGE) tablet 1,000 mg  1,000 mg Oral BID WC Fritzi Mandes, MD   1,000 mg at 06/19/22 1035   metoprolol tartrate (LOPRESSOR) tablet 12.5 mg  12.5 mg Oral BID Judd Gaudier V, MD   12.5 mg at 06/19/22 1035   mirtazapine (REMERON) tablet 30 mg  30 mg Oral QHS Athena Masse, MD   30 mg at 06/18/22 2004   multivitamin with minerals tablet 1 tablet  1 tablet Oral Daily Athena Masse, MD   1 tablet at 06/19/22 1035   omega-3 acid ethyl esters (LOVAZA) capsule 1 g  1 g Oral Daily Fritzi Mandes, MD   1 g at 06/19/22 1036   ondansetron (ZOFRAN) tablet 4 mg  4 mg Oral Q6H PRN Athena Masse, MD       Or   ondansetron [facility]) injection 4 mg  4 mg Intravenous Q6H PRN Athena Masse, MD       oxyCODONE (Oxy IR/ROXICODONE) immediate release tablet 5 mg  5 mg Oral Q6H PRN Enzo Bi, MD       pantoprazole (PROTONIX) EC tablet 40 mg  40 mg Oral Daily Fritzi Mandes, MD   40 mg at 06/19/22 1035   polyethylene glycol (MIRALAX / GLYCOLAX) packet 17 g  17 g Oral Daily PRN Fritzi Mandes, MD       tamsulosin [facility] capsule 0.8 mg  0.8 mg Oral Daily Judd Gaudier V, MD   0.8 mg at 06/19/22 1036   thiamine (VITAMIN B1) tablet 100 mg  100 mg Oral Daily Fritzi Mandes, MD   100 mg at 06/19/22 1036     Discharge Medications: Please see discharge summary for a list of discharge  medications.  Relevant Imaging Results:  Relevant Lab Results:   Additional Information SS# [id number]  Gerilyn Pilgrim, LCSW

## 2022-06-19 NOTE — Progress Notes (Signed)
  PROGRESS NOTE    Hector Miller  ZOX:[id number] DOB: [birth year] DOA: 06/16/2022 [facility]: [facility]  116A/116A-AA  LOS: 2 days   Brief hospital course:   Assessment & Plan: Hector Miller is a 77 y.o. male with medical history significant for coronary artery disease, diabetes mellitus, hypertension, dyslipidemia, alcohol use disorder, depression, chronic low back pain, who presents to the ED following a fall at home where he lives alone.  Workup in the ED was significant for an acute subdural and subarachnoid hemorrhage without shift and he was also noted to have a high anion gap metabolic acidosis of uncertain etiology.      Subdural hematoma (HCC) Fall at home CT scan showed 4 mm falcine subdural hematoma, with associated subarachnoid hemorrhage without shift.  ED provider spoke with neurosurgeon, Dr. Cari Caraway who recommended a 6-hour repeat CT which is stable. Fall precautions --PT/OT, SNF rehab   High anion gap metabolic acidosis Hydrated in the ED.  Etiology uncertain resolved   Alcohol use disorder CIWA withdrawal protocol No s/o WD   Diabetes mellitus without complication (HCC) --W1X 6.7 Sliding scale insulin coverage --cont metformin   Coronary artery disease --cont ASA   Chronic pain syndrome Continue gabapentin   HTN (hypertension) --cont home metop    DVT prophylaxis: SCD/Compression stockings Code Status: DNR  Family Communication:  Level of care: Med-Surg Dispo:   The patient is from: home Anticipated d/c is to: SNF rehab Anticipated d/c date is: whenever bed available   Subjective and Interval History:  Pt complained of headache, and also missing his daily scotch.     Objective: Vitals:   06/19/22 0516 06/19/22 0759 06/19/22 1234 06/19/22 1607  BP: (!) 113/53 116/77 111/71 114/68  Pulse: (!) 57 (!) 40 88 88  Resp: 18 16 18 18   Temp: 98.3 F (36.8 C) (!) 97 F (36.1 C)  98 F (36.7 C)  TempSrc:      SpO2: 96% 93% 96%  96%  Weight:      Height:        Intake/Output Summary (Last 24 hours) at 06/19/2022 1735 Last data filed at 06/18/2022 2311 Gross per 24 hour  Intake --  Output 350 ml  Net -350 ml   Filed Weights   06/16/22 1847  Weight: 90.7 kg    Examination:   Constitutional: NAD, AAOx3, sitting in recliner HEENT: conjunctivae and lids normal, EOMI CV: No cyanosis.   RESP: normal respiratory effort, on RA Neuro: II - XII grossly intact.   Psych: Normal mood and affect.  Appropriate judgement and reason   Data Reviewed: I have personally reviewed labs and imaging studies  Time spent: 35 minutes  Hector Bi, MD [facility] If 7PM-7AM, please contact night-coverage 06/19/2022, 5:35 PM

## 2022-06-20 DIAGNOSIS — W19XXXA Unspecified fall, initial encounter: Secondary | ICD-10-CM | POA: Diagnosis not present

## 2022-06-20 DIAGNOSIS — F1090 Alcohol use, unspecified, uncomplicated: Secondary | ICD-10-CM | POA: Diagnosis not present

## 2022-06-20 LAB — GLUCOSE, CAPILLARY
Glucose-Capillary: 211 mg/dL — ABNORMAL HIGH (ref 70–99)
Glucose-Capillary: 133 mg/dL — ABNORMAL HIGH (ref 70–99)
Glucose-Capillary: 172 mg/dL — ABNORMAL HIGH (ref 70–99)
Glucose-Capillary: 164 mg/dL — ABNORMAL HIGH (ref 70–99)

## 2022-06-20 LAB — BASIC METABOLIC PANEL
Anion gap: 10 (ref 5–15)
BUN: 13 mg/dL (ref 8–23)
CO2: 24 mmol/L (ref 22–32)
Calcium: 9.2 mg/dL (ref 8.9–10.3)
Chloride: 100 mmol/L (ref 98–111)
Creatinine, Ser: 0.78 mg/dL (ref 0.61–1.24)
GFR, Estimated: >60 mL/min (ref >60)
Glucose, Bld: 155 mg/dL — ABNORMAL HIGH (ref 70–99)
Potassium: 3.8 mmol/L (ref 3.5–5.1)
Sodium: 134 mmol/L — ABNORMAL LOW (ref 135–145)

## 2022-06-20 LAB — CBC
HCT: 41.9 % (ref 39.0–52.0)
Hemoglobin: 13.9 g/dL (ref 13.0–17.0)
MCH: 33.1 pg (ref 26.0–34.0)
MCHC: 33.2 g/dL (ref 30.0–36.0)
MCV: 99.8 fL (ref 80.0–100.0)
Platelets: 135 10*3/uL — ABNORMAL LOW (ref 150–400)
RBC: 4.2 MIL/uL — ABNORMAL LOW (ref 4.22–5.81)
RDW: 15 % (ref 11.5–15.5)
WBC: 7.7 10*3/uL (ref 4.0–10.5)
nRBC: 0 % (ref 0.0–0.2)

## 2022-06-20 LAB — MAGNESIUM: Magnesium: 1.7 mg/dL (ref 1.7–2.4)

## 2022-06-20 NOTE — Plan of Care (Signed)

## 2022-06-20 NOTE — Progress Notes (Signed)
  PROGRESS NOTE    Hector Miller  ZOX:[id number] DOB: Oct 07, 1945 DOA: 06/16/2022 [facility]CP: [facility]  116A/116A-AA  LOS: 3 days   Brief hospital course:   Assessment & Plan: Hector Miller is a 77 y.o. male with medical history significant for coronary artery disease, diabetes mellitus, hypertension, dyslipidemia, alcohol use disorder, depression, chronic low back pain, who presents to the ED following a fall at home where he lives alone.  Workup in the ED was significant for an acute subdural and subarachnoid hemorrhage without shift and he was also noted to have a high anion gap metabolic acidosis of uncertain etiology.      Subdural hematoma (HCC) Fall at home CT scan showed 4 mm falcine subdural hematoma, with associated subarachnoid hemorrhage without shift.  ED provider spoke with neurosurgeon, Dr. Cari Miller who recommended a 6-hour repeat CT which is stable. Fall precautions --PT/OT, SNF rehab   High anion gap metabolic acidosis Hydrated in the ED.  Etiology uncertain resolved   Alcohol use disorder CIWA withdrawal protocol No s/o WD   Diabetes mellitus without complication (HCC) --W1X 6.7 Sliding scale insulin coverage --cont metformin   Coronary artery disease --cont ASA   Chronic pain syndrome Continue gabapentin   HTN (hypertension) --cont home metop    DVT prophylaxis: SCD/Compression stockings Code Status: DNR  Family Communication:  Level of care: Med-Surg Dispo:   The patient is from: home Anticipated d/c is to: SNF rehab Anticipated d/c date is: whenever bed available   Subjective and Interval History:  Headache resolved today.  No new complaint.   Objective: Vitals:   06/20/22 0031 06/20/22 0531 06/20/22 0731 06/20/22 1612  BP: 120/82 118/80 120/84 118/70  Pulse: 92 91 87 88  Resp: 16 18 16 18   Temp: (!) 97.4 F (36.3 C) 98.3 F (36.8 C) 97.8 F (36.6 C) 97.6 F (36.4 C)  TempSrc:   Oral   SpO2: 97% 100% 97% 96%   Weight:      Height:        Intake/Output Summary (Last 24 hours) at 06/20/2022 1932 Last data filed at 06/20/2022 1857 Gross per 24 hour  Intake 240 ml  Output 1750 ml  Net -1510 ml   Filed Weights   06/16/22 1847  Weight: 90.7 kg    Examination:   Constitutional: NAD, AAOx3 HEENT: conjunctivae and lids normal, EOMI CV: No cyanosis.   RESP: normal respiratory effort, on RA Neuro: II - XII grossly intact.   Psych: Normal mood and affect.  Appropriate judgement and reason   Data Reviewed: I have personally reviewed labs and imaging studies  Time spent: 25 minutes  Hector Bi, MD [facility] If 7PM-7AM, please contact night-coverage 06/20/2022, 7:32 PM

## 2022-06-20 NOTE — Progress Notes (Signed)
Physical Therapy Treatment Patient Details Name: Hector Miller MRN: [id number] DOB: 1945-06-12 Today's Date: 06/20/2022   History of Present Illness Hector Miller is a 77 y.o. male with medical history significant for coronary artery disease, diabetes mellitus, hypertension, dyslipidemia, alcohol use disorder, depression, chronic low back pain, who presents to the ED following a fall at home where he lives alone.  Workup in the ED was significant for an acute subdural and subarachnoid hemorrhage without shift and he was also noted to have a high anion gap metabolic acidosis of uncertain etiology.    PT Comments    Pt received in bed and agreeable to PT. ModA with HOB raised for supine to sit. No c/o dizziness with positional change. Pt required several attempts to stand from raised bed due to difficulty coordinating movements and weight shifting forward. Once standing, pt required increased time to side shuffle to bedside chair with support of RW, ModA, and repeated vc's for balance and technique. Pt positioned in chair with all needs in reach. Continue to recommend SNF for continued rehab once medically cleared.    Recommendations for follow up therapy are one component of a multi-disciplinary discharge planning process, led by the attending physician.  Recommendations may be updated based on patient status, additional functional criteria and insurance authorization.  Follow Up Recommendations  Skilled nursing-short term rehab (<3 hours/day) Can patient physically be transported by private vehicle: No   Assistance Recommended at Discharge Frequent or constant Supervision/Assistance  Patient can return home with the following A lot of help with walking and/or transfers;A lot of help with bathing/dressing/bathroom;Help with stairs or ramp for entrance;Assist for transportation;Assistance with cooking/housework   Equipment Recommendations  None recommended by PT    Recommendations for  Other Services       Precautions / Restrictions Precautions Precautions: Fall Restrictions Weight Bearing Restrictions: No     Mobility  Bed Mobility Overal bed mobility: Needs Assistance Bed Mobility: Supine to Sit     Supine to sit: Mod assist, HOB elevated     General bed mobility comments:  (Increased time to complete with repeated vc's on hand placement)    Transfers Overall transfer level: Needs assistance Equipment used: Rolling walker (2 wheels) Transfers: Sit to/from Stand, Bed to chair/wheelchair/BSC Sit to Stand: Mod assist Stand pivot transfers: Max assist, From elevated surface         General transfer comment: VC for safe technique (hand placement, anterior weight shift) increased time to coordinate movements    Ambulation/Gait               General Gait Details: Only able to sidestep with RW to bedside chair. May need +2 to follow with chair   Stairs             Wheelchair Mobility    Modified Rankin (Stroke Patients Only)       Balance Overall balance assessment: Needs assistance, History of Falls Sitting-balance support: Feet supported Sitting balance-Leahy Scale: Good     Standing balance support: Bilateral upper extremity supported, During functional activity, Reliant on assistive device for balance Standing balance-Leahy Scale: Poor Standing balance comment:  (No dizziness, decreased motor control)                            Cognition Arousal/Alertness: Awake/alert Behavior During Therapy: WFL for tasks assessed/performed Overall Cognitive Status: Within Functional Limits for tasks assessed  General Comments:  (Pleasant, cooperative)        Exercises General Exercises - Lower Extremity Ankle Circles/Pumps: AROM, Both, 10 reps Long Arc Quad: AROM, Both, 10 reps Hip Flexion/Marching: AROM, Both, 10 reps    General Comments General comments (skin integrity,  edema, etc.):  (Multiple sores and callouses on bilateral toes with c/o soreness)      Pertinent Vitals/Pain Pain Assessment Pain Assessment: 0-10 Pain Score: 2  Pain Location: Right foot initially Pain Descriptors / Indicators: Discomfort, Sore Pain Intervention(s): Limited activity within patient's tolerance, Monitored during session    Home Living                          Prior Function            PT Goals (current goals can now be found in the care plan section) Acute Rehab PT Goals Patient Stated Goal: wants to go home with caregiver if can get set up by [place].    Frequency    Min 2X/week      PT Plan Current plan remains appropriate    Co-evaluation              AM-PAC PT "6 Clicks" Mobility   Outcome Measure  Help needed turning from your back to your side while in a flat bed without using bedrails?: A Lot Help needed moving from lying on your back to sitting on the side of a flat bed without using bedrails?: A Lot Help needed moving to and from a bed to a chair (including a wheelchair)?: A Lot Help needed standing up from a chair using your arms (e.g., wheelchair or bedside chair)?: A Lot Help needed to walk in hospital room?: A Lot Help needed climbing 3-5 steps with a railing? : Total 6 Click Score: 11    End of Session Equipment Utilized During Treatment: Gait belt Activity Tolerance: Patient tolerated treatment well Patient left: in chair;with call bell/phone within reach;with chair alarm set;with nursing/sitter in room Nurse Communication: Mobility status (Sores/callouses on toes) PT Visit Diagnosis: Other abnormalities of gait and mobility (R26.89);Unsteadiness on feet (R26.81);Repeated falls (R29.6);Muscle weakness (generalized) (M62.81);Difficulty in walking, not elsewhere classified (R26.2);Dizziness and giddiness (R42)     Time: 5102-5852 PT Time Calculation (min) (ACUTE ONLY): 23 min  Charges:  $Therapeutic Exercise: 8-22  mins $Therapeutic Activity: 8-22 mins                    Mikel Cella, PTA  Josie Dixon 06/20/2022, 11:57 AM

## 2022-06-21 DIAGNOSIS — I609 Nontraumatic subarachnoid hemorrhage, unspecified: Secondary | ICD-10-CM | POA: Diagnosis not present

## 2022-06-21 DIAGNOSIS — W19XXXA Unspecified fall, initial encounter: Secondary | ICD-10-CM | POA: Diagnosis not present

## 2022-06-21 LAB — GLUCOSE, CAPILLARY
Glucose-Capillary: 155 mg/dL — ABNORMAL HIGH (ref 70–99)
Glucose-Capillary: 209 mg/dL — ABNORMAL HIGH (ref 70–99)
Glucose-Capillary: 180 mg/dL — ABNORMAL HIGH (ref 70–99)
Glucose-Capillary: 149 mg/dL — ABNORMAL HIGH (ref 70–99)

## 2022-06-21 MED ORDER — TRAMADOL HCL 50 MG PO TABS
50.0000 mg | ORAL_TABLET | Freq: Four times a day (QID) | ORAL | Status: DC | PRN
  Administered 2022-06-23 (×2): 50 mg via ORAL
  Filled 2022-06-21 (×2): qty 1

## 2022-06-21 NOTE — TOC Progression Note (Signed)
Transition of Care [facility]) - Progression Note    Patient Details  Name: Hector Miller MRN: [id number] Date of Birth: May 31, 1945  [facility]) CM/SW Contact  Gerilyn Pilgrim, LCSW Phone Number: 06/21/2022, 3:31 PM  Clinical Narrative:   Requested clinicals sent to [employer] representative to get auth for [facility].          Expected Discharge Plan and Services                                               Social Determinants of Health (SDOH) Interventions SDOH Screenings   Food Insecurity: No Food Insecurity (06/17/2022)  Housing: Low Risk  (06/17/2022)  Transportation Needs: No Transportation Needs (06/17/2022)  Utilities: Not At Risk (06/17/2022)  Tobacco Use: Medium Risk (06/16/2022)    Readmission Risk Interventions    05/16/2021   11:08 AM  Readmission Risk Prevention Plan  Post Dischage Appt Complete  Medication Screening Complete  Transportation Screening Complete

## 2022-06-21 NOTE — TOC Progression Note (Signed)
Transition of Care [facility]) - Progression Note    Patient Details  Name: Hector Miller MRN: [id number] Date of Birth: [birth year]  [facility]) CM/SW Contact  Gerilyn Pilgrim, LCSW Phone Number: 06/21/2022, 2:26 PM  Clinical Narrative:  Pt's POA called as he had spoke with pt. Pt now agreeable to going to [facility] under his [facility] benefit. SW reached out to [facility] and she is attempting to get his auth through the [place].          Expected Discharge Plan and Services                                               Social Determinants of Health (SDOH) Interventions SDOH Screenings   Food Insecurity: No Food Insecurity (06/17/2022)  Housing: Low Risk  (06/17/2022)  Transportation Needs: No Transportation Needs (06/17/2022)  Utilities: Not At Risk (06/17/2022)  Tobacco Use: Medium Risk (06/16/2022)    Readmission Risk Interventions    05/16/2021   11:08 AM  Readmission Risk Prevention Plan  Post Dischage Appt Complete  Medication Screening Complete  Transportation Screening Complete

## 2022-06-21 NOTE — Progress Notes (Signed)
Occupational Therapy Treatment Patient Details Name: Amor Packard MRN: [id number] DOB: 1946-02-14 Today's Date: 06/21/2022   History of present illness Guadalupe Nickless is a 77 y.o. male with medical history significant for coronary artery disease, diabetes mellitus, hypertension, dyslipidemia, alcohol use disorder, depression, chronic low back pain, who presents to the ED following a fall at home where he lives alone.  Workup in the ED was significant for an acute subdural and subarachnoid hemorrhage without shift and he was also noted to have a high anion gap metabolic acidosis of uncertain etiology.   OT comments  Upon entering session, pt supine in bed and agreeable to OT. Pt stating he needs to get OOB because his bottom hurts (RN notified). He required Mod A to come to EOB, Mod A for sit<>stand, and Mod-Max A for step pivot transfer to bedside recliner using RW. Pt demonstrated slight improvement in coordinating movements this date. Session cut short due to MD entering room to speak with pt and then NT entering room to take pt's blood glucose. Pt left sitting up in recliner with all needs in reach. Pt is making progress toward goal completion. D/C recommendation remains appropriate. OT will continue to follow acutely.       Recommendations for follow up therapy are one component of a multi-disciplinary discharge planning process, led by the attending physician.  Recommendations may be updated based on patient status, additional functional criteria and insurance authorization.    Follow Up Recommendations  Skilled nursing-short term rehab (<3 hours/day)     Assistance Recommended at Discharge Frequent or constant Supervision/Assistance  Patient can return home with the following  A lot of help with bathing/dressing/bathroom;A lot of help with walking and/or transfers;Assistance with cooking/housework;Assist for transportation;Help with stairs or ramp for entrance   Equipment  Recommendations  Other (comment) (defer to next venue of care)    Recommendations for Other Services      Precautions / Restrictions Precautions Precautions: Fall Restrictions Weight Bearing Restrictions: No       Mobility Bed Mobility Overal bed mobility: Needs Assistance Bed Mobility: Supine to Sit     Supine to sit: Mod assist, HOB elevated          Transfers Overall transfer level: Needs assistance Equipment used: Rolling walker (2 wheels) Transfers: Sit to/from Stand, Bed to chair/wheelchair/BSC Sit to Stand: Mod assist     Step pivot transfers: Mod assist, Max assist     General transfer comment: VC for safe technique (hand placement, anterior weight shift), increased time to coordinate movements     Balance Overall balance assessment: Needs assistance, History of Falls Sitting-balance support: Feet supported Sitting balance-Leahy Scale: Good     Standing balance support: Bilateral upper extremity supported, During functional activity, Reliant on assistive device for balance Standing balance-Leahy Scale: Poor                             ADL either performed or assessed with clinical judgement   ADL Overall ADL's : Needs assistance/impaired     Grooming: Set up;Supervision/safety;Sitting                                      Extremity/Trunk Assessment Upper Extremity Assessment Upper Extremity Assessment: Generalized weakness   Lower Extremity Assessment Lower Extremity Assessment: Generalized weakness        Vision Patient Visual  Report: No change from baseline     Perception     Praxis      Cognition Arousal/Alertness: Awake/alert Behavior During Therapy: WFL for tasks assessed/performed Overall Cognitive Status: Within Functional Limits for tasks assessed                                          Exercises      Shoulder Instructions       General Comments      Pertinent Vitals/  Pain       Pain Assessment Pain Assessment: Faces Faces Pain Scale: Hurts a little bit Pain Location: bottom Pain Descriptors / Indicators: Discomfort, Sore Pain Intervention(s): Limited activity within patient's tolerance, Monitored during session, Repositioned, Patient requesting pain meds-RN notified  Home Living                                          Prior Functioning/Environment              Frequency  Min 2X/week        Progress Toward Goals  OT Goals(current goals can now be found in the care plan section)  Progress towards OT goals: Progressing toward goals  Acute Rehab OT Goals Patient Stated Goal: return home OT Goal Formulation: With patient Time For Goal Achievement: 07/02/22 Potential to Achieve Goals: [place] Discharge plan remains appropriate;Frequency remains appropriate    Co-evaluation                 AM-PAC OT "6 Clicks" Daily Activity     Outcome Measure   Help from another person eating meals?: None Help from another person taking care of personal grooming?: A Little Help from another person toileting, which includes using toliet, bedpan, or urinal?: A Lot Help from another person bathing (including washing, rinsing, drying)?: A Lot Help from another person to put on and taking off regular upper body clothing?: A Little Help from another person to put on and taking off regular lower body clothing?: A Lot 6 Click Score: 16    End of Session Equipment Utilized During Treatment: Gait belt;Rolling walker (2 wheels)  OT Visit Diagnosis: Unsteadiness on feet (R26.81);History of falling (Z91.81);Pain;Muscle weakness (generalized) (M62.81);Dizziness and giddiness (R42)   Activity Tolerance Patient tolerated treatment well;Patient limited by pain   Patient Left in chair;with call bell/phone within reach;with chair alarm set   Nurse Communication Mobility status;Patient requests pain meds        Time:  1610-9604 OT Time Calculation (min): 13 min  Charges: OT General Charges $OT Visit: 1 Visit OT Treatments $Self Care/Home Management : 8-22 mins  [facility] MS, OTR/L ascom [phone]  06/21/22, 1:35 PM

## 2022-06-21 NOTE — Progress Notes (Signed)
  PROGRESS NOTE    Hector Miller  BJS:[id number] DOB: 04-15-1946 DOA: 06/16/2022 PCP: [facility]  116A/116A-AA  LOS: 4 days   Brief hospital course:   Assessment & Plan: Hector Miller is a 77 y.o. male with medical history significant for coronary artery disease, diabetes mellitus, hypertension, dyslipidemia, alcohol use disorder, depression, chronic low back pain, who presents to the ED following a fall at home where he lives alone.  Workup in the ED was significant for an acute subdural and subarachnoid hemorrhage without shift and he was also noted to have a high anion gap metabolic acidosis of uncertain etiology.      Subdural hematoma (HCC) Fall at home CT scan showed 4 mm falcine subdural hematoma, with associated subarachnoid hemorrhage without shift.  ED provider spoke with neurosurgeon, Dr. Cari Caraway who recommended a 6-hour repeat CT which is stable. Fall precautions --PT/OT, SNF rehab   High anion gap metabolic acidosis, resolved Hydrated in the ED.  Etiology uncertain   Alcohol use disorder CIWA withdrawal protocol No s/o WD   Diabetes mellitus without complication (HCC) --Y0V 6.7 Sliding scale insulin coverage --cont metformin   Coronary artery disease --cont ASA   Chronic pain syndrome Continue gabapentin --tramadol PRN   HTN (hypertension) --cont home metop    DVT prophylaxis: SCD/Compression stockings Code Status: DNR  Family Communication:  Level of care: Med-Surg Dispo:   The patient is from: home Anticipated d/c is to: SNF rehab Anticipated d/c date is: whenever bed available   Subjective and Interval History:  Pt reported pain in his right buttock which is chronic.   Objective: Vitals:   06/20/22 2156 06/21/22 0401 06/21/22 0743 06/21/22 1542  BP: (!) 122/57 118/62 132/72 (!) 103/50  Pulse: 75 69 92 65  Resp: 19 18 20 20   Temp: 98.4 F (36.9 C) 98.1 F (36.7 C) 98.3 F (36.8 C) 98 F (36.7 C)  TempSrc:   Oral    SpO2: 97% 95% 97% 99%  Weight:      Height:        Intake/Output Summary (Last 24 hours) at 06/21/2022 2135 Last data filed at 06/21/2022 1545 Gross per 24 hour  Intake --  Output 2400 ml  Net -2400 ml   Filed Weights   06/16/22 1847  Weight: 90.7 kg    Examination:   Constitutional: NAD, AAOx3, sitting in recliner HEENT: conjunctivae and lids normal, EOMI CV: No cyanosis.   RESP: normal respiratory effort, on RA Neuro: II - XII grossly intact.   Psych: Normal mood and affect.  Appropriate judgement and reason   Data Reviewed: I have personally reviewed labs and imaging studies  Time spent: 25 minutes  Enzo Bi, MD [facility] If 7PM-7AM, please contact night-coverage 06/21/2022, 9:35 PM

## 2022-06-21 NOTE — Progress Notes (Signed)
PT Cancellation Note  Patient Details Name: Hector Miller MRN: [id number] DOB: [birth year]   Cancelled Treatment:    Reason Eval/Treat Not Completed: Fatigue/lethargy limiting ability to participate  Pt OOB earlier with OT.  Pt back to bed on attempt.  Sleeping soundly.  Will defer and return at a later time/date.   Chesley Noon 06/21/2022, 2:34 PM

## 2022-06-21 NOTE — TOC Progression Note (Addendum)
Transition of Care [facility]) - Progression Note    Patient Details  Name: Hector Miller MRN: [id number] Date of Birth: [birth year]  Transition of Care [facility]) CM/SW Contact  Gerilyn Pilgrim, LCSW Phone Number: 06/21/2022, 10:04 AM  Clinical Narrative:   Josem Kaufmann started under patients [employer] for [facility].  11:40AM Auth obtained for [facility] now stating they are full and unable to accept any admissions. CSW will follow up with patient for additional facilities.        Expected Discharge Plan and Services                                               Social Determinants of Health (SDOH) Interventions SDOH Screenings   Food Insecurity: No Food Insecurity (06/17/2022)  Housing: Low Risk  (06/17/2022)  Transportation Needs: No Transportation Needs (06/17/2022)  Utilities: Not At Risk (06/17/2022)  Tobacco Use: Medium Risk (06/16/2022)    Readmission Risk Interventions    05/16/2021   11:08 AM  Readmission Risk Prevention Plan  Post Dischage Appt Complete  Medication Screening Complete  Transportation Screening Complete

## 2022-06-22 DIAGNOSIS — E119 Type 2 diabetes mellitus without complications: Secondary | ICD-10-CM

## 2022-06-22 DIAGNOSIS — I10 Essential (primary) hypertension: Secondary | ICD-10-CM

## 2022-06-22 DIAGNOSIS — I2510 Atherosclerotic heart disease of native coronary artery without angina pectoris: Secondary | ICD-10-CM | POA: Diagnosis not present

## 2022-06-22 DIAGNOSIS — S065XAA Traumatic subdural hemorrhage with loss of consciousness status unknown, initial encounter: Secondary | ICD-10-CM | POA: Diagnosis not present

## 2022-06-22 DIAGNOSIS — W19XXXA Unspecified fall, initial encounter: Secondary | ICD-10-CM | POA: Diagnosis not present

## 2022-06-22 DIAGNOSIS — I2583 Coronary atherosclerosis due to lipid rich plaque: Secondary | ICD-10-CM

## 2022-06-22 LAB — GLUCOSE, CAPILLARY
Glucose-Capillary: 157 mg/dL — ABNORMAL HIGH (ref 70–99)
Glucose-Capillary: 165 mg/dL — ABNORMAL HIGH (ref 70–99)
Glucose-Capillary: 129 mg/dL — ABNORMAL HIGH (ref 70–99)
Glucose-Capillary: 138 mg/dL — ABNORMAL HIGH (ref 70–99)

## 2022-06-22 NOTE — Progress Notes (Signed)
Occupational Therapy Treatment Patient Details Name: Hector Miller MRN: [id number] DOB: [birth year] Today's Date: 06/22/2022   History of present illness Hector Miller is a 77 y.o. male with medical history significant for coronary artery disease, diabetes mellitus, hypertension, dyslipidemia, alcohol use disorder, depression, chronic low back pain, who presents to the ED following a fall at home where he lives alone.  Workup in the ED was significant for an acute subdural and subarachnoid hemorrhage without shift and he was also noted to have a high anion gap metabolic acidosis of uncertain etiology.   OT comments  Upon entering room, pt supine in bed and agreeable to OT. Pt required Min A to come to EOB. He then engaged in seated grooming tasks with set up-supervision. Pt able to stand from EOB with Mod A and take lateral steps to be closer to [facility] with Min A using RW. Pt then requesting to lay back down 2/2 fatigue and required Min A to return to supine. Pt left as received with all needs in reach. Pt is making progress toward goal completion. D/C recommendation remains appropriate. OT will continue to follow acutely.    Recommendations for follow up therapy are one component of a multi-disciplinary discharge planning process, led by the attending physician.  Recommendations may be updated based on patient status, additional functional criteria and insurance authorization.    Follow Up Recommendations  Skilled nursing-short term rehab (<3 hours/day)     Assistance Recommended at Discharge Frequent or constant Supervision/Assistance  Patient can return home with the following  A lot of help with bathing/dressing/bathroom;A lot of help with walking and/or transfers;Assistance with cooking/housework;Assist for transportation;Help with stairs or ramp for entrance   Equipment Recommendations  Other (comment) (defer to next venue of care)    Recommendations for Other Services       Precautions / Restrictions Precautions Precautions: Fall Restrictions Weight Bearing Restrictions: No       Mobility Bed Mobility Overal bed mobility: Needs Assistance Bed Mobility: Supine to Sit, Sit to Supine     Supine to sit: HOB elevated, Min assist Sit to supine: Min assist    VC required to scoot hips forward at EOB - Min guard for safety     Transfers Overall transfer level: Needs assistance Equipment used: Rolling walker (2 wheels) Transfers: Sit to/from Stand Sit to Stand: Mod assist (from EOB)                 Balance Overall balance assessment: Needs assistance, History of Falls Sitting-balance support: Feet supported Sitting balance-Leahy Scale: Good     Standing balance support: Bilateral upper extremity supported Standing balance-Leahy Scale: Fair                             ADL either performed or assessed with clinical judgement   ADL Overall ADL's : Needs assistance/impaired     Grooming: Set up;Supervision/safety;Sitting;Wash/dry face;Oral care;Brushing hair                               Functional mobility during ADLs: Minimal assistance;Rolling walker (2 wheels) (to take lateral steps at EOB)      Extremity/Trunk Assessment Upper Extremity Assessment Upper Extremity Assessment: Generalized weakness   Lower Extremity Assessment Lower Extremity Assessment: Generalized weakness        Vision Patient Visual Report: No change from baseline     Perception  Praxis      Cognition Arousal/Alertness: Awake/alert Behavior During Therapy: WFL for tasks assessed/performed Overall Cognitive Status: Within Functional Limits for tasks assessed                                          Exercises      Shoulder Instructions       General Comments      Pertinent Vitals/ Pain       Pain Assessment Pain Assessment: Faces Faces Pain Scale: Hurts a little bit Pain Location: back Pain  Descriptors / Indicators: Discomfort, Sore Pain Intervention(s): Limited activity within patient's tolerance, Monitored during session, Repositioned  Home Living                                          Prior Functioning/Environment              Frequency  Min 2X/week        Progress Toward Goals  OT Goals(current goals can now be found in the care plan section)  Progress towards OT goals: Progressing toward goals  Acute Rehab OT Goals Patient Stated Goal: return home OT Goal Formulation: With patient Time For Goal Achievement: 07/02/22 Potential to Achieve Goals: [place] Discharge plan remains appropriate;Frequency remains appropriate    Co-evaluation                 AM-PAC OT "6 Clicks" Daily Activity     Outcome Measure   Help from another person eating meals?: None Help from another person taking care of personal grooming?: A Little Help from another person toileting, which includes using toliet, bedpan, or urinal?: A Lot Help from another person bathing (including washing, rinsing, drying)?: A Lot Help from another person to put on and taking off regular upper body clothing?: A Little Help from another person to put on and taking off regular lower body clothing?: A Lot 6 Click Score: 16    End of Session Equipment Utilized During Treatment: Gait belt;Rolling walker (2 wheels)  OT Visit Diagnosis: Unsteadiness on feet (R26.81);History of falling (Z91.81);Pain;Muscle weakness (generalized) (M62.81);Dizziness and giddiness (R42)   Activity Tolerance Patient tolerated treatment well   Patient Left in bed;with call bell/phone within reach;with bed alarm set   Nurse Communication Mobility status;Other (comment) (Pt endorsed feeling "depressed" - MD & RN notified)        Time: 1700-1749 OT Time Calculation (min): 16 min  Charges:    [name], OTR/L ascom [phone]  06/22/22, 5:06 PM

## 2022-06-22 NOTE — Progress Notes (Addendum)
PROGRESS NOTE    Hector Miller   WUJ:[id number] DOB: [birth year]  DOA: 06/16/2022 Date of Service: 06/22/22 PCP: [facility] [place]     Brief Narrative / Hospital Course:  Hector Miller is a 77 y.o. male with medical history significant for coronary artery disease, diabetes mellitus, hypertension, dyslipidemia, alcohol use disorder, depression, chronic low back pain, who presents to the ED following a fall at home where he lives alone.  Workup in the ED was significant for an acute subdural and subarachnoid hemorrhage without shift and he was also noted to have a high anion gap metabolic acidosis of uncertain etiology. ED provider spoke with neurosurgeon, Dr. Cari Caraway who recommended a 6-hour repeat CT which was stable. Has beenin patient awaiting SNF placement.      Consultants:  Neurosurgery   Procedures: none      ASSESSMENT & PLAN:   Principal Problem:   Fall Active Problems:   Fall at home, initial encounter   Subdural hematoma (HCC)   High anion gap metabolic acidosis   HTN (hypertension)   Chronic pain syndrome   Coronary artery disease   Diabetes mellitus without complication (HCC)   Alcohol use disorder   Falls   Subdural hematoma (HCC) - stable Fall at home PT/OT, SNF rehab Repeat CT outpatient    High anion gap metabolic acidosis, resolved Monitor periodic BMP   Alcohol use disorder CIWA withdrawal protocol   Diabetes mellitus without complication (HCC) N5A 6.7 Sliding scale insulin coverage cont metformin   Coronary artery disease cont ASA   Chronic pain syndrome Continue gabapentin tramadol PRN   HTN (hypertension) cont home metop    DVT prophylaxis: SCD/compression, pharmacologic contraindicated in SDH Pertinent IV fluids/nutrition: none Central lines / invasive devices: none  Code Status: DNR  Current Admission Status: inpatient  TOC needs / Dispo plan: SNF Barriers to discharge / significant pending items:  SNF placement pending, pt is medically stable              Subjective / Brief ROS:  Patient reports no concerns  Denies CP/SOB.  Pain controlled.  Denies new weakness.  Tolerating diet.  Reports no concerns w/ urination/defecation.   Family Communication: spoke w/ nephew 06/22/22 4:24 PM on the phone all questions answered     Objective Findings:  Vitals:   06/21/22 2208 06/21/22 2211 06/22/22 0510 06/22/22 0810  BP: (!) 102/52  103/77 114/73  Pulse: 69 73 70 90  Resp: 18  20 18   Temp: (!) 97.5 F (36.4 C)  97.6 F (36.4 C) 98.2 F (36.8 C)  TempSrc:      SpO2: (!) 82% 97% 94% 100%  Weight:      Height:        Intake/Output Summary (Last 24 hours) at 06/22/2022 2130 Last data filed at 06/21/2022 1545 Gross per 24 hour  Intake --  Output 1600 ml  Net -1600 ml   Filed Weights   06/16/22 1847  Weight: 90.7 kg    Examination:  Physical Exam Constitutional:      General: He is not in acute distress. Cardiovascular:     Rate and Rhythm: Normal rate and regular rhythm.     Heart sounds: Normal heart sounds.  Pulmonary:     Effort: Pulmonary effort is normal.     Breath sounds: Normal breath sounds.  Musculoskeletal:     Right lower leg: No edema.     Left lower leg: No edema.  Skin:    General:  Skin is warm and dry.  Neurological:     General: No focal deficit present.     Mental Status: He is alert.  Psychiatric:        Mood and Affect: Mood normal.        Behavior: Behavior normal.          Scheduled Medications:   aspirin  81 mg Oral Daily   cholecalciferol  1,000 Units Oral Daily   finasteride  5 mg Oral Daily   folic acid  1 mg Oral Daily   gabapentin  300 mg Oral TID   insulin aspart  0-15 Units Subcutaneous TID WC   insulin aspart  0-5 Units Subcutaneous QHS   metFORMIN  1,000 mg Oral BID WC   metoprolol tartrate  12.5 mg Oral BID   mirtazapine  30 mg Oral QHS   multivitamin with minerals  1 tablet Oral Daily   omega-3 acid  ethyl esters  1 g Oral Daily   pantoprazole  40 mg Oral Daily   tamsulosin  0.8 mg Oral Daily   thiamine  100 mg Oral Daily    Continuous Infusions:   PRN Medications:  acetaminophen, ondansetron **OR** ondansetron (ZOFRAN) IV, polyethylene glycol, traMADol  Antimicrobials from admission:  Anti-infectives (From admission, onward)    None           Data Reviewed:  I have personally reviewed the following...  CBC: Recent Labs  Lab 06/16/22 1850 06/20/22 0457  WBC 7.9 7.7  HGB 14.6 13.9  HCT 43.8 41.9  MCV 99.3 99.8  PLT 121* 244*   Basic Metabolic Panel: Recent Labs  Lab 06/16/22 1850 06/17/22 0608 06/20/22 0457  NA 133* 136 134*  K 3.7 3.6 3.8  CL 99 101 100  CO2 15* 23 24  GLUCOSE 178* 110* 155*  BUN 7* 6* 13  CREATININE 0.87 0.72 0.78  CALCIUM 9.2 8.5* 9.2  MG  --   --  1.7   GFR: Estimated Creatinine Clearance: 84.9 mL/min (by C-G formula based on SCr of 0.78 mg/dL). Liver Function Tests: No results for input(s): "AST", "ALT", "ALKPHOS", "BILITOT", "PROT", "ALBUMIN" in the last 168 hours. No results for input(s): "LIPASE", "AMYLASE" in the last 168 hours. No results for input(s): "AMMONIA" in the last 168 hours. Coagulation Profile: No results for input(s): "INR", "PROTIME" in the last 168 hours. Cardiac Enzymes: Recent Labs  Lab 06/17/22 0608  CKTOTAL 137   BNP (last 3 results) No results for input(s): "PROBNP" in the last 8760 hours. HbA1C: No results for input(s): "HGBA1C" in the last 72 hours. CBG: Recent Labs  Lab 06/21/22 0745 06/21/22 1130 06/21/22 1544 06/21/22 2204 06/22/22 0807  GLUCAP 155* 209* 180* 149* 157*   Lipid Profile: No results for input(s): "CHOL", "HDL", "LDLCALC", "TRIG", "CHOLHDL", "LDLDIRECT" in the last 72 hours. Thyroid Function Tests: No results for input(s): "TSH", "T4TOTAL", "FREET4", "T3FREE", "THYROIDAB" in the last 72 hours. Anemia Panel: No results for input(s): "VITAMINB12", "FOLATE",  "FERRITIN", "TIBC", "IRON", "RETICCTPCT" in the last 72 hours. Most Recent Urinalysis On File:     Component Value Date/Time   COLORURINE YELLOW (A) 06/17/2022 0141   APPEARANCEUR CLEAR (A) 06/17/2022 0141   LABSPEC 1.023 06/17/2022 0141   PHURINE 6.0 06/17/2022 0141   GLUCOSEU >=500 (A) 06/17/2022 0141   HGBUR NEGATIVE 06/17/2022 0141   BILIRUBINUR NEGATIVE 06/17/2022 0141   KETONESUR 5 (A) 06/17/2022 0141   PROTEINUR NEGATIVE 06/17/2022 0141   UROBILINOGEN 0.2 03/26/2013 1219   NITRITE NEGATIVE  06/17/2022 0141   LEUKOCYTESUR NEGATIVE 06/17/2022 0141   Sepsis Labs: @LABRCNTIP (procalcitonin:4,lacticidven:4) Microbiology: Recent Results (from the past 240 hour(s))  Resp panel by RT-PCR (RSV, Flu A&B, Covid) Anterior Nasal Swab     Status: None   Collection Time: 06/16/22 10:08 PM   Specimen: Anterior Nasal Swab  Result Value Ref Range Status   SARS Coronavirus 2 by RT PCR NEGATIVE NEGATIVE Final    Comment: (NOTE) SARS-CoV-2 target nucleic acids are NOT DETECTED.  The SARS-CoV-2 RNA is generally detectable in upper respiratory specimens during the acute phase of infection. The lowest concentration of SARS-CoV-2 viral copies this assay can detect is 138 copies/mL. A negative result does not preclude SARS-Cov-2 infection and should not be used as the sole basis for treatment or other patient management decisions. A negative result may occur with  improper specimen collection/handling, submission of specimen other than nasopharyngeal swab, presence of viral mutation(s) within the areas targeted by this assay, and inadequate number of viral copies(<138 copies/mL). A negative result must be combined with clinical observations, patient history, and epidemiological information. The expected result is Negative.  Fact Sheet for Patients:  EntrepreneurPulse.com.au  Fact Sheet for Healthcare Providers:  IncredibleEmployment.be  This test is no t  yet approved or cleared by the [place] FDA and  has been authorized for detection and/or diagnosis of SARS-CoV-2 by FDA under an Emergency Use Authorization (EUA). This EUA will remain  in effect (meaning this test can be used) for the duration of the COVID-19 declaration under Section 564(b)(1) of the Act, 21 U.S.C.section 360bbb-3(b)(1), unless the authorization is terminated  or revoked sooner.       Influenza A by PCR NEGATIVE NEGATIVE Final   Influenza B by PCR NEGATIVE NEGATIVE Final    Comment: (NOTE) The Xpert Xpress SARS-CoV-2/FLU/RSV plus assay is intended as an aid in the diagnosis of influenza from Nasopharyngeal swab specimens and should not be used as a sole basis for treatment. Nasal washings and aspirates are unacceptable for Xpert Xpress SARS-CoV-2/FLU/RSV testing.  Fact Sheet for Patients: EntrepreneurPulse.com.au  Fact Sheet for Healthcare Providers: IncredibleEmployment.be  This test is not yet approved or cleared by the [place] FDA and has been authorized for detection and/or diagnosis of SARS-CoV-2 by FDA under an Emergency Use Authorization (EUA). This EUA will remain in effect (meaning this test can be used) for the duration of the COVID-19 declaration under Section 564(b)(1) of the Act, 21 U.S.C. section 360bbb-3(b)(1), unless the authorization is terminated or revoked.     Resp Syncytial Virus by PCR NEGATIVE NEGATIVE Final    Comment: (NOTE) Fact Sheet for Patients: EntrepreneurPulse.com.au  Fact Sheet for Healthcare Providers: IncredibleEmployment.be  This test is not yet approved or cleared by the [place] FDA and has been authorized for detection and/or diagnosis of SARS-CoV-2 by FDA under an Emergency Use Authorization (EUA). This EUA will remain in effect (meaning this test can be used) for the duration of the COVID-19 declaration under Section 564(b)(1)  of the Act, 21 U.S.C. section 360bbb-3(b)(1), unless the authorization is terminated or revoked.  Performed at [facility], [address], [place], [place] [address]       Radiology Studies last 3 days: No results found.           LOS: 5 days      Emeterio Reeve, DO [facility] 06/22/2022, 8:12 AM    Dictation software may have been used to generate the above note. Typos may occur and escape review in  typed/dictated notes. Please contact Dr Sheppard Coil directly for clarity if needed.  Staff may message me via secure chat in [place]  but this may not receive an immediate response,  please page me for urgent matters!  If 7PM-7AM, please contact night coverage [email or web address]

## 2022-06-22 NOTE — TOC Progression Note (Signed)
Transition of Care [facility]) - Progression Note    Patient Details  Name: Hector Miller MRN: [id number] Date of Birth: June 15, 1945  [facility]) CM/SW Contact  Gerilyn Pilgrim, LCSW Phone Number: 06/22/2022, 4:41 PM  Clinical Narrative:   CSW sent updated noes to the [facility] for WOM.        Expected Discharge Plan and Services                                               Social Determinants of Health (SDOH) Interventions SDOH Screenings   Food Insecurity: No Food Insecurity (06/17/2022)  Housing: Low Risk  (06/17/2022)  Transportation Needs: No Transportation Needs (06/17/2022)  Utilities: Not At Risk (06/17/2022)  Tobacco Use: Medium Risk (06/16/2022)    Readmission Risk Interventions    05/16/2021   11:08 AM  Readmission Risk Prevention Plan  Post Dischage Appt Complete  Medication Screening Complete  Transportation Screening Complete

## 2022-06-22 NOTE — Progress Notes (Signed)
Physical Therapy Treatment Patient Details Name: Hector Miller MRN: [id number] DOB: 29-Dec-1945 Today's Date: 06/22/2022   History of Present Illness Hector Miller is a 77 y.o. male with medical history significant for coronary artery disease, diabetes mellitus, hypertension, dyslipidemia, alcohol use disorder, depression, chronic low back pain, who presents to the ED following a fall at home where he lives alone.  Workup in the ED was significant for an acute subdural and subarachnoid hemorrhage without shift and he was also noted to have a high anion gap metabolic acidosis of uncertain etiology.    PT Comments    Pt making small gains with sit<>stand transfers requiring mod A then with training min A with cues for body mechanics.  Pt making small gains with standing balance and tolerance, requiring min A with pre gait activities.  Deferred gait  this session due to large urinary output during session, staying close to wall unit.  Current PT D/C is still appropriate.  Recommendations for follow up therapy are one component of a multi-disciplinary discharge planning process, led by the attending physician.  Recommendations may be updated based on patient status, additional functional criteria and insurance authorization.  Follow Up Recommendations  Skilled nursing-short term rehab (<3 hours/day) Can patient physically be transported by private vehicle: Yes   Assistance Recommended at Discharge Frequent or constant Supervision/Assistance  Patient can return home with the following A lot of help with walking and/or transfers;A lot of help with bathing/dressing/bathroom;Help with stairs or ramp for entrance;Assist for transportation;Assistance with cooking/housework   Equipment Recommendations  None recommended by PT    Recommendations for Other Services OT consult     Precautions / Restrictions Precautions Precautions: Fall Restrictions Weight Bearing Restrictions: No      Mobility  Bed Mobility               General bed mobility comments: Pt up in recliner.    Transfers Overall transfer level: Needs assistance Equipment used: Rolling walker (2 wheels) Transfers: Sit to/from Stand Sit to Stand: Mod assist, Min guard           General transfer comment: Multiple sit<>stands from recliner working on body mechanics, with multimodal cues.    Ambulation/Gait             Pre-gait activities: worked on standing posture, marching in place, standing lateral weigh shifts, and balance with alternate UE raises. General Gait Details: Worked on pre gait activites due to needing to stay close to recliner; pt with large urinary output, stayed close to wall unit.   Stairs             Wheelchair Mobility    Modified Rankin (Stroke Patients Only)       Balance Overall balance assessment: Needs assistance, History of Falls Sitting-balance support: Feet supported Sitting balance-Leahy Scale: Good Sitting balance - Comments: Pt performed upper trunk strengthening/mobility with rotations and reaching to feet, no UE support needed.  Seated marching, pt required light UE support for trunk control.   Standing balance support: Bilateral upper extremity supported Standing balance-Leahy Scale: Fair Standing balance comment: see pregait activities.                            Cognition Arousal/Alertness: Awake/alert Behavior During Therapy: WFL for tasks assessed/performed Overall Cognitive Status: Within Functional Limits for tasks assessed  Exercises Total Joint Exercises Towel Squeeze: Strengthening, Both, 10 reps Straight Leg Raises: Strengthening, Both, 10 reps    General Comments        Pertinent Vitals/Pain Pain Assessment Pain Assessment: Faces Pain Score: 0-No pain    Home Living                          Prior Function            PT  Goals (current goals can now be found in the care plan section) Acute Rehab PT Goals Patient Stated Goal: wants to go home with caregiver if can get set up by [place]. PT Goal Formulation: With patient Time For Goal Achievement: 07/01/22 Potential to Achieve Goals: Fair Progress towards PT goals: Progressing toward goals    Frequency    Min 2X/week      PT Plan Current plan remains appropriate    Co-evaluation              AM-PAC PT "6 Clicks" Mobility   Outcome Measure  Help needed turning from your back to your side while in a flat bed without using bedrails?: A Lot Help needed moving from lying on your back to sitting on the side of a flat bed without using bedrails?: A Lot Help needed moving to and from a bed to a chair (including a wheelchair)?: A Lot Help needed standing up from a chair using your arms (e.g., wheelchair or bedside chair)?: A Lot Help needed to walk in hospital room?: A Lot Help needed climbing 3-5 steps with a railing? : Total 6 Click Score: 11    End of Session Equipment Utilized During Treatment: Gait belt Activity Tolerance: Patient tolerated treatment well Patient left: in chair;with call bell/phone within reach;with chair alarm set Nurse Communication: Mobility status PT Visit Diagnosis: Other abnormalities of gait and mobility (R26.89);Unsteadiness on feet (R26.81);Repeated falls (R29.6);Muscle weakness (generalized) (M62.81);Difficulty in walking, not elsewhere classified (R26.2);Dizziness and giddiness (R42)     Time: 5631-4970 PT Time Calculation (min) (ACUTE ONLY): 20 min  Charges:  $Therapeutic Activity: 8-22 mins                     Bjorn Loser, PTA  06/22/22, 10:34 AM

## 2022-06-22 NOTE — Hospital Course (Addendum)
Hector Miller is a 77 y.o. male with medical history significant for coronary artery disease, diabetes mellitus, hypertension, dyslipidemia, alcohol use disorder, depression, chronic low back pain, who presents to the ED following a fall at home where he lives alone.  Workup in the ED was significant for an acute subdural and subarachnoid hemorrhage without shift and he was also noted to have a high anion gap metabolic acidosis of uncertain etiology. ED provider spoke with neurosurgeon, Dr. Cari Caraway who recommended a 6-hour repeat CT which was stable. Has been inpatient awaiting SNF placement.      Consultants:  Neurosurgery   Procedures: none      ASSESSMENT & PLAN:   Principal Problem:   Fall Active Problems:   Fall at home, initial encounter   Subdural hematoma (HCC)   High anion gap metabolic acidosis   HTN (hypertension)   Chronic pain syndrome   Coronary artery disease   Diabetes mellitus without complication (HCC)   Alcohol use disorder   Falls   Subdural hematoma (HCC) - stable Fall at home PT/OT, SNF rehab Repeat CT outpatient    High anion gap metabolic acidosis, resolved Monitor periodic BMP   Alcohol use disorder CIWA withdrawal protocol   Diabetes mellitus without complication (HCC) 123456 6.7 Sliding scale insulin coverage cont metformin   Coronary artery disease Continue ASA - SDH stable ok to restart     Chronic pain syndrome Continue gabapentin tramadol PRN   HTN (hypertension) cont home metop    DVT prophylaxis: SCD/compression, pharmacologic contraindicated in SDH Pertinent IV fluids/nutrition: none Central lines / invasive devices: none  Code Status: DNR  Current Admission Status: inpatient  TOC needs / Dispo plan: SNF Barriers to discharge / significant pending items: SNF placement pending, pt is medically stable - per [facility] SNF will accept him tomorrow

## 2022-06-23 DIAGNOSIS — W19XXXA Unspecified fall, initial encounter: Secondary | ICD-10-CM | POA: Diagnosis not present

## 2022-06-23 DIAGNOSIS — S065XAA Traumatic subdural hemorrhage with loss of consciousness status unknown, initial encounter: Secondary | ICD-10-CM | POA: Diagnosis not present

## 2022-06-23 DIAGNOSIS — I10 Essential (primary) hypertension: Secondary | ICD-10-CM | POA: Diagnosis not present

## 2022-06-23 DIAGNOSIS — I2510 Atherosclerotic heart disease of native coronary artery without angina pectoris: Secondary | ICD-10-CM | POA: Diagnosis not present

## 2022-06-23 LAB — GLUCOSE, CAPILLARY
Glucose-Capillary: 143 mg/dL — ABNORMAL HIGH (ref 70–99)
Glucose-Capillary: 216 mg/dL — ABNORMAL HIGH (ref 70–99)
Glucose-Capillary: 132 mg/dL — ABNORMAL HIGH (ref 70–99)
Glucose-Capillary: 176 mg/dL — ABNORMAL HIGH (ref 70–99)

## 2022-06-23 MED ORDER — GABAPENTIN 300 MG PO CAPS
400.0000 mg | ORAL_CAPSULE | Freq: Every day | ORAL | Status: DC
  Administered 2022-06-23: 400 mg via ORAL
  Filled 2022-06-23: qty 1

## 2022-06-23 NOTE — Progress Notes (Signed)
Physical Therapy Treatment Patient Details Name: Hector Miller MRN: [id number] DOB: 05/22/1945 Today's Date: 06/23/2022   History of Present Illness Hector Miller is a 77 y.o. male with medical history significant for coronary artery disease, diabetes mellitus, hypertension, dyslipidemia, alcohol use disorder, depression, chronic low back pain, who presents to the ED following a fall at home where he lives alone.  Workup in the ED was significant for an acute subdural and subarachnoid hemorrhage without shift and he was also noted to have a high anion gap metabolic acidosis of uncertain etiology.    PT Comments    Pt received up in chair. Discussed role of PT and mobility progression. Pt agreeable to try. Sit to stand with ModA from recliner. Pt completed 59ft of gait training with RW and MinA to prevent posterior LOB and assist with maneuvering of RW. Pt did have one occasion of LOB requiring ModA to prevent falling backwards. Overall, coordination/motor control slowly improving. Continue to recommend SNF placement once medically cleared for d/c.   Recommendations for follow up therapy are one component of a multi-disciplinary discharge planning process, led by the attending physician.  Recommendations may be updated based on patient status, additional functional criteria and insurance authorization.  Follow Up Recommendations  Skilled nursing-short term rehab (<3 hours/day) Can patient physically be transported by private vehicle: Yes   Assistance Recommended at Discharge Frequent or constant Supervision/Assistance  Patient can return home with the following A lot of help with walking and/or transfers;A lot of help with bathing/dressing/bathroom;Help with stairs or ramp for entrance;Assist for transportation;Assistance with cooking/housework   Equipment Recommendations  None recommended by PT    Recommendations for Other Services       Precautions / Restrictions  Precautions Precautions: Fall Restrictions Weight Bearing Restrictions: No     Mobility  Bed Mobility               General bed mobility comments:  (Pt up in recliner re/post session)    Transfers Overall transfer level: Needs assistance Equipment used: Rolling walker (2 wheels) Transfers: Sit to/from Stand Sit to Stand: Mod assist           General transfer comment:  ([place] to correct posterior retropulsion)    Ambulation/Gait Ambulation/Gait assistance: Min assist Gait Distance (Feet): 50 Feet Assistive device: Rolling walker (2 wheels) Gait Pattern/deviations: Step-to pattern, Shuffle, Staggering left, Staggering right Gait velocity: dec     General Gait Details: Remains high fall risk due to difficulty keeping weight forward   Stairs             Wheelchair Mobility    Modified Rankin (Stroke Patients Only)       Balance Overall balance assessment: Needs assistance, History of Falls Sitting-balance support: Feet supported Sitting balance-Leahy Scale: Good     Standing balance support: Bilateral upper extremity supported, During functional activity, Reliant on assistive device for balance Standing balance-Leahy Scale: Poor                              Cognition Arousal/Alertness: Awake/alert Behavior During Therapy: WFL for tasks assessed/performed Overall Cognitive Status: Within Functional Limits for tasks assessed                                          Exercises      General Comments  Pertinent Vitals/Pain Pain Assessment Pain Assessment: No/denies pain    Home Living                          Prior Function            PT Goals (current goals can now be found in the care plan section) Acute Rehab PT Goals Patient Stated Goal: wants to go home with caregiver if can get set up by [place]. Progress towards PT goals: Progressing toward goals    Frequency    Min 2X/week       PT Plan Current plan remains appropriate    Co-evaluation              AM-PAC PT "6 Clicks" Mobility   Outcome Measure  Help needed turning from your back to your side while in a flat bed without using bedrails?: A Lot Help needed moving from lying on your back to sitting on the side of a flat bed without using bedrails?: A Lot Help needed moving to and from a bed to a chair (including a wheelchair)?: A Lot Help needed standing up from a chair using your arms (e.g., wheelchair or bedside chair)?: A Lot Help needed to walk in hospital room?: A Lot Help needed climbing 3-5 steps with a railing? : Total 6 Click Score: 11    End of Session Equipment Utilized During Treatment: Gait belt Activity Tolerance: Patient tolerated treatment well Patient left: in chair;with call bell/phone within reach;with chair alarm set Nurse Communication: Mobility status PT Visit Diagnosis: Other abnormalities of gait and mobility (R26.89);Unsteadiness on feet (R26.81);Repeated falls (R29.6);Muscle weakness (generalized) (M62.81);Difficulty in walking, not elsewhere classified (R26.2);Dizziness and giddiness (R42)     Time: 1555-1610 PT Time Calculation (min) (ACUTE ONLY): 15 min  Charges:  $Gait Training: 8-22 mins                    Mikel Cella, PTA   Josie Dixon 06/23/2022, 4:16 PM

## 2022-06-23 NOTE — Progress Notes (Signed)
Mobility Specialist - Progress Note   06/23/22 1034  Mobility  Activity Ambulated with assistance in room  Level of Assistance Moderate assist, patient does 50-74%  Assistive Device Front wheel walker  Distance Ambulated (ft) 5 ft  Activity Response Tolerated well  Mobility Referral Yes  $Mobility charge 1 Mobility   Pt sitting in recliner on RA upon arrival. Pt STS with ModI and ambulates 4 steps forward and 3 steps backwards CGA. Pt has LOB corrected by Pryor Curia. Pt needs ModA to reposition in bed. Pt left in bed with needs in reach and bed alarm set.   Gretchen Short  Mobility Specialist  06/23/22 10:38 AM

## 2022-06-23 NOTE — Progress Notes (Signed)
PROGRESS NOTE    Hector Miller   ZOX:[id number] DOB: [birth year]  DOA: 06/16/2022 Date of Service: 06/23/22 PCP: [facility] [place]     Brief Narrative / Hospital Course:  Hector Miller is a 77 y.o. male with medical history significant for coronary artery disease, diabetes mellitus, hypertension, dyslipidemia, alcohol use disorder, depression, chronic low back pain, who presents to the ED following a fall at home where he lives alone.  Workup in the ED was significant for an acute subdural and subarachnoid hemorrhage without shift and he was also noted to have a high anion gap metabolic acidosis of uncertain etiology. ED provider spoke with neurosurgeon, Dr. Cari Caraway who recommended a 6-hour repeat CT which was stable. Has been inpatient awaiting SNF placement.      Consultants:  Neurosurgery   Procedures: none      ASSESSMENT & PLAN:   Principal Problem:   Fall Active Problems:   Fall at home, initial encounter   Subdural hematoma (HCC)   High anion gap metabolic acidosis   HTN (hypertension)   Chronic pain syndrome   Coronary artery disease   Diabetes mellitus without complication (HCC)   Alcohol use disorder   Falls   Subdural hematoma (HCC) - stable Fall at home PT/OT, SNF rehab Repeat CT outpatient    High anion gap metabolic acidosis, resolved Monitor periodic BMP   Alcohol use disorder CIWA withdrawal protocol   Diabetes mellitus without complication (HCC) W1X 6.7 Sliding scale insulin coverage cont metformin   Coronary artery disease cont ASA   Chronic pain syndrome Continue gabapentin tramadol PRN   HTN (hypertension) cont home metop    DVT prophylaxis: SCD/compression, pharmacologic contraindicated in SDH Pertinent IV fluids/nutrition: none Central lines / invasive devices: none  Code Status: DNR  Current Admission Status: inpatient  TOC needs / Dispo plan: SNF Barriers to discharge / significant pending items:  SNF placement pending, pt is medically stable - per [facility] SNF will accept him tomorrow             Subjective / Brief ROS:  Patient reports no concerns  Denies CP/SOB.  Pain controlled.  Denies new weakness.  Tolerating diet.  Reports no concerns w/ urination/defecation.   Family Communication: none at this time     Objective Findings:  Vitals:   06/22/22 1527 06/22/22 2001 06/23/22 0440 06/23/22 0911  BP: 108/62 109/64 (!) 127/42 (!) 141/95  Pulse: 78 87 (!) 42 60  Resp: 18 17 17 18   Temp: 98.4 F (36.9 C) 98.2 F (36.8 C) 98.5 F (36.9 C) 97.9 F (36.6 C)  TempSrc:    Oral  SpO2: 94% 97% 96% 100%  Weight:      Height:        Intake/Output Summary (Last 24 hours) at 06/23/2022 1525 Last data filed at 06/23/2022 1448 Gross per 24 hour  Intake --  Output 1100 ml  Net -1100 ml   Filed Weights   06/16/22 1847  Weight: 90.7 kg    Examination:  Physical Exam Constitutional:      General: He is not in acute distress. Cardiovascular:     Rate and Rhythm: Normal rate and regular rhythm.     Heart sounds: Normal heart sounds.  Pulmonary:     Effort: Pulmonary effort is normal.     Breath sounds: Normal breath sounds.  Musculoskeletal:     Right lower leg: No edema.     Left lower leg: No edema.  Skin:  General: Skin is warm and dry.  Neurological:     General: No focal deficit present.     Mental Status: He is alert.  Psychiatric:        Mood and Affect: Mood normal.        Behavior: Behavior normal.          Scheduled Medications:   aspirin  81 mg Oral Daily   cholecalciferol  1,000 Units Oral Daily   finasteride  5 mg Oral Daily   folic acid  1 mg Oral Daily   gabapentin  400 mg Oral QHS   insulin aspart  0-15 Units Subcutaneous TID WC   insulin aspart  0-5 Units Subcutaneous QHS   metFORMIN  1,000 mg Oral BID WC   metoprolol tartrate  12.5 mg Oral BID   mirtazapine  30 mg Oral QHS   multivitamin with minerals  1 tablet Oral Daily    omega-3 acid ethyl esters  1 g Oral Daily   pantoprazole  40 mg Oral Daily   tamsulosin  0.8 mg Oral Daily   thiamine  100 mg Oral Daily    Continuous Infusions:   PRN Medications:  acetaminophen, ondansetron **OR** ondansetron (ZOFRAN) IV, polyethylene glycol, traMADol  Antimicrobials from admission:  Anti-infectives (From admission, onward)    None           Data Reviewed:  I have personally reviewed the following...  CBC: Recent Labs  Lab 06/16/22 1850 06/20/22 0457  WBC 7.9 7.7  HGB 14.6 13.9  HCT 43.8 41.9  MCV 99.3 99.8  PLT 121* 644*   Basic Metabolic Panel: Recent Labs  Lab 06/16/22 1850 06/17/22 0608 06/20/22 0457  NA 133* 136 134*  K 3.7 3.6 3.8  CL 99 101 100  CO2 15* 23 24  GLUCOSE 178* 110* 155*  BUN 7* 6* 13  CREATININE 0.87 0.72 0.78  CALCIUM 9.2 8.5* 9.2  MG  --   --  1.7   GFR: Estimated Creatinine Clearance: 84.9 mL/min (by C-G formula based on SCr of 0.78 mg/dL). Liver Function Tests: No results for input(s): "AST", "ALT", "ALKPHOS", "BILITOT", "PROT", "ALBUMIN" in the last 168 hours. No results for input(s): "LIPASE", "AMYLASE" in the last 168 hours. No results for input(s): "AMMONIA" in the last 168 hours. Coagulation Profile: No results for input(s): "INR", "PROTIME" in the last 168 hours. Cardiac Enzymes: Recent Labs  Lab 06/17/22 0608  CKTOTAL 137   BNP (last 3 results) No results for input(s): "PROBNP" in the last 8760 hours. HbA1C: No results for input(s): "HGBA1C" in the last 72 hours. CBG: Recent Labs  Lab 06/22/22 1216 06/22/22 1523 06/22/22 2011 06/23/22 0908 06/23/22 1217  GLUCAP 165* 129* 138* 143* 216*   Lipid Profile: No results for input(s): "CHOL", "HDL", "LDLCALC", "TRIG", "CHOLHDL", "LDLDIRECT" in the last 72 hours. Thyroid Function Tests: No results for input(s): "TSH", "T4TOTAL", "FREET4", "T3FREE", "THYROIDAB" in the last 72 hours. Anemia Panel: No results for input(s): "VITAMINB12",  "FOLATE", "FERRITIN", "TIBC", "IRON", "RETICCTPCT" in the last 72 hours. Most Recent Urinalysis On File:     Component Value Date/Time   COLORURINE YELLOW (A) 06/17/2022 0141   APPEARANCEUR CLEAR (A) 06/17/2022 0141   LABSPEC 1.023 06/17/2022 0141   PHURINE 6.0 06/17/2022 0141   GLUCOSEU >=500 (A) 06/17/2022 0141   HGBUR NEGATIVE 06/17/2022 0141   BILIRUBINUR NEGATIVE 06/17/2022 0141   KETONESUR 5 (A) 06/17/2022 0141   PROTEINUR NEGATIVE 06/17/2022 0141   UROBILINOGEN 0.2 03/26/2013 1219   NITRITE  NEGATIVE 06/17/2022 0141   LEUKOCYTESUR NEGATIVE 06/17/2022 0141   Sepsis Labs: @LABRCNTIP (procalcitonin:4,lacticidven:4) Microbiology: Recent Results (from the past 240 hour(s))  Resp panel by RT-PCR (RSV, Flu A&B, Covid) Anterior Nasal Swab     Status: None   Collection Time: 06/16/22 10:08 PM   Specimen: Anterior Nasal Swab  Result Value Ref Range Status   SARS Coronavirus 2 by RT PCR NEGATIVE NEGATIVE Final    Comment: (NOTE) SARS-CoV-2 target nucleic acids are NOT DETECTED.  The SARS-CoV-2 RNA is generally detectable in upper respiratory specimens during the acute phase of infection. The lowest concentration of SARS-CoV-2 viral copies this assay can detect is 138 copies/mL. A negative result does not preclude SARS-Cov-2 infection and should not be used as the sole basis for treatment or other patient management decisions. A negative result may occur with  improper specimen collection/handling, submission of specimen other than nasopharyngeal swab, presence of viral mutation(s) within the areas targeted by this assay, and inadequate number of viral copies(<138 copies/mL). A negative result must be combined with clinical observations, patient history, and epidemiological information. The expected result is Negative.  Fact Sheet for Patients:  EntrepreneurPulse.com.au  Fact Sheet for Healthcare Providers:  IncredibleEmployment.be  This  test is no t yet approved or cleared by the [place] FDA and  has been authorized for detection and/or diagnosis of SARS-CoV-2 by FDA under an Emergency Use Authorization (EUA). This EUA will remain  in effect (meaning this test can be used) for the duration of the COVID-19 declaration under Section 564(b)(1) of the Act, 21 U.S.C.section 360bbb-3(b)(1), unless the authorization is terminated  or revoked sooner.       Influenza A by PCR NEGATIVE NEGATIVE Final   Influenza B by PCR NEGATIVE NEGATIVE Final    Comment: (NOTE) The Xpert Xpress SARS-CoV-2/FLU/RSV plus assay is intended as an aid in the diagnosis of influenza from Nasopharyngeal swab specimens and should not be used as a sole basis for treatment. Nasal washings and aspirates are unacceptable for Xpert Xpress SARS-CoV-2/FLU/RSV testing.  Fact Sheet for Patients: EntrepreneurPulse.com.au  Fact Sheet for Healthcare Providers: IncredibleEmployment.be  This test is not yet approved or cleared by the [place] FDA and has been authorized for detection and/or diagnosis of SARS-CoV-2 by FDA under an Emergency Use Authorization (EUA). This EUA will remain in effect (meaning this test can be used) for the duration of the COVID-19 declaration under Section 564(b)(1) of the Act, 21 U.S.C. section 360bbb-3(b)(1), unless the authorization is terminated or revoked.     Resp Syncytial Virus by PCR NEGATIVE NEGATIVE Final    Comment: (NOTE) Fact Sheet for Patients: EntrepreneurPulse.com.au  Fact Sheet for Healthcare Providers: IncredibleEmployment.be  This test is not yet approved or cleared by the [place] FDA and has been authorized for detection and/or diagnosis of SARS-CoV-2 by FDA under an Emergency Use Authorization (EUA). This EUA will remain in effect (meaning this test can be used) for the duration of the COVID-19 declaration under  Section 564(b)(1) of the Act, 21 U.S.C. section 360bbb-3(b)(1), unless the authorization is terminated or revoked.  Performed at [facility], [address] [place], [place] [address]       Radiology Studies last 3 days: No results found.           LOS: 6 days      Emeterio Reeve, DO [facility] 06/23/2022, 3:25 PM    Dictation software may have been used to generate the above note. Typos may occur and escape review  in typed/dictated notes. Please contact Dr Sheppard Coil directly for clarity if needed.  Staff may message me via secure chat in [place]  but this may not receive an immediate response,  please page me for urgent matters!  If 7PM-7AM, please contact night coverage [email or web address]

## 2022-06-23 NOTE — Progress Notes (Signed)
Occupational Therapy Treatment Patient Details Name: Hector Miller MRN: [id number] DOB: 07/02/1945 Today's Date: 06/23/2022   History of present illness Hector Miller is a 77 y.o. male with medical history significant for coronary artery disease, diabetes mellitus, hypertension, dyslipidemia, alcohol use disorder, depression, chronic low back pain, who presents to the ED following a fall at home where he lives alone.  Workup in the ED was significant for an acute subdural and subarachnoid hemorrhage without shift and he was also noted to have a high anion gap metabolic acidosis of uncertain etiology.   OT comments  Mr Hector Miller was seen for OT treatment on this date. Upon arrival to room pt reclined in bed, agreeable to tx. Pt requires MOD A exit bed, assist for trunk. MOD A + RW sit<>stand and bed>chair t/f. Pt making good progress toward goals, will continue to follow POC. Discharge recommendation remains appropriate.     Recommendations for follow up therapy are one component of a multi-disciplinary discharge planning process, led by the attending physician.  Recommendations may be updated based on patient status, additional functional criteria and insurance authorization.    Follow Up Recommendations  Skilled nursing-short term rehab (<3 hours/day)     Assistance Recommended at Discharge Frequent or constant Supervision/Assistance  Patient can return home with the following  A lot of help with bathing/dressing/bathroom;A lot of help with walking and/or transfers;Assistance with cooking/housework;Assist for transportation;Help with stairs or ramp for entrance   Equipment Recommendations  Other (comment) (defer)    Recommendations for Other Services      Precautions / Restrictions Precautions Precautions: Fall Restrictions Weight Bearing Restrictions: No       Mobility Bed Mobility Overal bed mobility: Needs Assistance Bed Mobility: Supine to Sit     Supine to sit: Mod  assist     General bed mobility comments: assist for trunk    Transfers Overall transfer level: Needs assistance Equipment used: Rolling walker (2 wheels) Transfers: Sit to/from Stand Sit to Stand: Mod assist                 Balance Overall balance assessment: Needs assistance, History of Falls Sitting-balance support: Feet supported Sitting balance-Leahy Scale: Good     Standing balance support: Bilateral upper extremity supported Standing balance-Leahy Scale: Poor                             ADL either performed or assessed with clinical judgement   ADL Overall ADL's : Needs assistance/impaired                                       General ADL Comments: MOD A + RW simulated BSC t/f      Cognition Arousal/Alertness: Awake/alert Behavior During Therapy: WFL for tasks assessed/performed Overall Cognitive Status: Within Functional Limits for tasks assessed                                                      Pertinent Vitals/ Pain       Pain Assessment Pain Assessment: Faces Faces Pain Scale: Hurts little more Pain Location: B feet Pain Descriptors / Indicators: Discomfort, Sore Pain Intervention(s): Limited activity within patient's tolerance, RN gave  pain meds during session   Frequency  Min 2X/week        Progress Toward Goals  OT Goals(current goals can now be found in the care plan section)  Progress towards OT goals: Progressing toward goals  Acute Rehab OT Goals Patient Stated Goal: to walk OT Goal Formulation: With patient Time For Goal Achievement: 07/02/22 Potential to Achieve Goals: Fair ADL Goals Pt Will Perform Grooming: with modified independence;standing Pt Will Perform Lower Body Dressing: with modified independence;sit to/from stand Pt Will Transfer to Toilet: with modified independence;ambulating Pt Will Perform Toileting - Clothing Manipulation and hygiene: with modified  independence;sit to/from stand  Plan Discharge plan remains appropriate;Frequency remains appropriate    Co-evaluation                 AM-PAC OT "6 Clicks" Daily Activity     Outcome Measure   Help from another person eating meals?: None Help from another person taking care of personal grooming?: A Little Help from another person toileting, which includes using toliet, bedpan, or urinal?: A Lot Help from another person bathing (including washing, rinsing, drying)?: A Lot Help from another person to put on and taking off regular upper body clothing?: A Little Help from another person to put on and taking off regular lower body clothing?: A Lot 6 Click Score: 16    End of Session    OT Visit Diagnosis: Unsteadiness on feet (R26.81);Muscle weakness (generalized) (M62.81)   Activity Tolerance Patient tolerated treatment well   Patient Left with call bell/phone within reach;in chair;with chair alarm set   Nurse Communication          Time: 212 120 9020 OT Time Calculation (min): 11 min  Charges: OT General Charges $OT Visit: 1 Visit OT Treatments $Self Care/Home Management : 8-22 mins  Dessie Coma, M.S. OTR/L  06/23/22, 3:37 PM  ascom [phone]

## 2022-06-24 DIAGNOSIS — I2510 Atherosclerotic heart disease of native coronary artery without angina pectoris: Secondary | ICD-10-CM | POA: Diagnosis not present

## 2022-06-24 DIAGNOSIS — I10 Essential (primary) hypertension: Secondary | ICD-10-CM | POA: Diagnosis not present

## 2022-06-24 DIAGNOSIS — W19XXXA Unspecified fall, initial encounter: Secondary | ICD-10-CM | POA: Diagnosis not present

## 2022-06-24 DIAGNOSIS — S065XAA Traumatic subdural hemorrhage with loss of consciousness status unknown, initial encounter: Secondary | ICD-10-CM | POA: Diagnosis not present

## 2022-06-24 LAB — GLUCOSE, CAPILLARY
Glucose-Capillary: 137 mg/dL — ABNORMAL HIGH (ref 70–99)
Glucose-Capillary: 178 mg/dL — ABNORMAL HIGH (ref 70–99)

## 2022-06-24 MED ORDER — METOPROLOL TARTRATE 25 MG PO TABS: 12.5000 mg | ORAL_TABLET | Freq: Two times a day (BID) | ORAL | 0 refills | Status: AC

## 2022-06-24 MED ORDER — GABAPENTIN 400 MG PO CAPS: 400.0000 mg | ORAL_CAPSULE | Freq: Every day | ORAL | 0 refills | Status: DC

## 2022-06-24 MED ORDER — GABAPENTIN 400 MG PO CAPS: 400.0000 mg | ORAL_CAPSULE | Freq: Every day | ORAL | 0 refills | Status: AC

## 2022-06-24 MED ORDER — METOPROLOL TARTRATE 25 MG PO TABS: 12.5000 mg | ORAL_TABLET | Freq: Two times a day (BID) | ORAL | 0 refills | Status: DC

## 2022-06-24 NOTE — Discharge Summary (Signed)
Physician Discharge Summary   Patient: Hector Miller MRN: [id number]  DOB: 1945-05-30   Admit:     Date of Admission: 06/16/2022 Admitted from: home   Discharge: Date of discharge: 06/24/22 Disposition: Skilled nursing facility Condition at discharge: good  CODE STATUS: DNR     Discharge Physician: Emeterio Reeve, DO [facility]     PCP: [facility] [place]  Recommendations for Outpatient Follow-up:  Follow up with PCP [facility] in 2-4 weeks Please obtain labs/tests: CBC, BMP in 2-4 weeks Please follow up on the following pending results: none Monitor closely for severe headache or mental status changes - would need CT head repeat PCP AND OTHER OUTPATIENT PROVIDERS: SEE BELOW FOR SPECIFIC DISCHARGE INSTRUCTIONS PRINTED FOR PATIENT IN ADDITION TO GENERIC AVS PATIENT INFO    Discharge Instructions     Call MD for:  difficulty breathing, headache or visual disturbances   Complete by: As directed    Call MD for:  severe uncontrolled pain   Complete by: As directed    Diet - low sodium heart healthy   Complete by: As directed    Discharge instructions   Complete by: As directed    To ED for mental status changes or new focal weakness/stroke symptoms   Increase activity slowly   Complete by: As directed          Discharge Diagnoses: Principal Problem:   Fall Active Problems:   Fall at home, initial encounter   Subdural hematoma (HCC)   High anion gap metabolic acidosis   HTN (hypertension)   Chronic pain syndrome   Coronary artery disease   Diabetes mellitus without complication ([place])   Alcohol use disorder   Falls       Hospital Course: [name] is a 77 y.o. male with medical history significant for coronary artery disease, diabetes mellitus, hypertension, dyslipidemia, alcohol use disorder, depression, chronic low back pain, who presents to the ED following a fall at home where he lives alone.  Workup  in the ED was significant for an acute subdural and subarachnoid hemorrhage without shift and he was also noted to have a high anion gap metabolic acidosis of uncertain etiology. ED provider spoke with neurosurgeon, Dr. Cari Caraway who recommended a 6-hour repeat CT which was stable. Has been stable and awaiting SNF placement.      Consultants:  Neurosurgery   Procedures: none      ASSESSMENT & PLAN:   Principal Problem:   Fall Active Problems:   Fall at home, initial encounter   Subdural hematoma (HCC)   High anion gap metabolic acidosis   HTN (hypertension)   Chronic pain syndrome   Coronary artery disease   Diabetes mellitus without complication (HCC)   Alcohol use disorder   Falls   Subdural hematoma (HCC) - stable Fall at home PT/OT, SNF rehab Repeat CT outpatient if any neuro changes   High anion gap metabolic acidosis, resolved Monitor periodic BMP   Alcohol use disorder CIWA withdrawal protocol - past window for withdrawals    Diabetes mellitus without complication (HCC) 123456 6.7 Sliding scale insulin coverage while inpatient cont metformin outpatient    Coronary artery disease Continue ASA - SDH stable ok to restart     Chronic pain syndrome Continue gabapentin tramadol PRN   HTN (hypertension) cont home metop, reduced dose d/t hypotension            Discharge Instructions  Allergies as of 06/24/2022  Reactions   Lisinopril Cough        Medication List     STOP taking these medications    methocarbamol 500 MG tablet Commonly known as: ROBAXIN   oxyCODONE 5 MG immediate release tablet Commonly known as: Oxy IR/ROXICODONE       TAKE these medications    acamprosate 333 MG tablet Commonly known as: CAMPRAL Take 666 mg by mouth 3 (three) times daily with meals.   acetaminophen 325 MG tablet Commonly known as: TYLENOL Take 2 tablets (650 mg total) by mouth every 6 (six) hours as needed for mild pain, fever or headache  (or Fever >/= 101).   albuterol 108 (90 Base) MCG/ACT inhaler Commonly known as: VENTOLIN HFA Inhale 2 puffs into the lungs every 6 (six) hours as needed for wheezing or shortness of breath.   allopurinol 100 MG tablet Commonly known as: ZYLOPRIM Take 100 mg by mouth daily. TAKE EVERY DAY BY MOUTH FOR GOUT **DECREASED DOSE FOR KIDNEY INJURY   aspirin 81 MG chewable tablet Chew 1 tablet (81 mg total) by mouth daily.   cholecalciferol 25 MCG (1000 UNIT) tablet Commonly known as: VITAMIN D3 Take 1,000 Units by mouth daily.   empagliflozin 10 MG Tabs tablet Commonly known as: JARDIANCE Take 10 mg by mouth daily.   finasteride 5 MG tablet Commonly known as: PROSCAR Take 5 mg by mouth daily.   Fish Oil 1000 MG Caps Take 1,000 mg by mouth 2 (two) times daily.   folic acid 1 MG tablet Commonly known as: FOLVITE Take 1 mg by mouth daily.   gabapentin 400 MG capsule Commonly known as: NEURONTIN Take 1 capsule (400 mg total) by mouth at bedtime. What changed:  medication strength how much to take when to take this   lidocaine 5 % ointment Commonly known as: XYLOCAINE Apply 1 Application topically 5 (five) times daily as needed for mild pain or moderate pain.   metFORMIN 500 MG tablet Commonly known as: GLUCOPHAGE Take 1,000 mg by mouth 2 (two) times daily with a meal.   metoprolol tartrate 25 MG tablet Commonly known as: LOPRESSOR Take 0.5 tablets (12.5 mg total) by mouth 2 (two) times daily.   mirtazapine 30 MG tablet Commonly known as: REMERON Take 30 mg by mouth at bedtime.   nitroGLYCERIN 0.4 MG SL tablet Commonly known as: NITROSTAT Place 0.4 mg under the tongue every 5 (five) minutes as needed for chest pain.   pantoprazole 40 MG tablet Commonly known as: PROTONIX Take 40 mg by mouth daily.   polyethylene glycol 17 g packet Commonly known as: MIRALAX / GLYCOLAX Take 17 g by mouth daily as needed for mild constipation.   tamsulosin 0.4 MG Caps  capsule Commonly known as: FLOMAX Take 2 capsules (0.8 mg total) by mouth daily.   thiamine 100 MG tablet Commonly known as: Vitamin B-1 Take 100 mg by mouth daily.          Allergies  Allergen Reactions   Lisinopril Cough     Subjective: pt feeling a bit tired this morning but otherwise ok, no CP/SOB, no pain otherwise, no LE edema, tolerating diet   Discharge Exam: BP (!) 113/52 (BP Location: Right Arm)   Pulse 81   Temp 98.1 F (36.7 C)   Resp 18   Ht 6' (1.829 m)   Wt 90.7 kg   SpO2 97%   BMI 27.12 kg/m  General: Pt is alert, awake, not in acute distress Cardiovascular: RRR, S1/S2 +, no  rubs, no gallops Respiratory: CTA bilaterally, no wheezing, no rhonchi Abdominal: Soft, NT, ND, bowel sounds + Extremities: no edema, no cyanosis     The results of significant diagnostics from this hospitalization (including imaging, microbiology, ancillary and laboratory) are listed below for reference.     Microbiology: Recent Results (from the past 240 hour(s))  Resp panel by RT-PCR (RSV, Flu A&B, Covid) Anterior Nasal Swab     Status: None   Collection Time: 06/16/22 10:08 PM   Specimen: Anterior Nasal Swab  Result Value Ref Range Status   SARS Coronavirus 2 by RT PCR NEGATIVE NEGATIVE Final    Comment: (NOTE) SARS-CoV-2 target nucleic acids are NOT DETECTED.  The SARS-CoV-2 RNA is generally detectable in upper respiratory specimens during the acute phase of infection. The lowest concentration of SARS-CoV-2 viral copies this assay can detect is 138 copies/mL. A negative result does not preclude SARS-Cov-2 infection and should not be used as the sole basis for treatment or other patient management decisions. A negative result may occur with  improper specimen collection/handling, submission of specimen other than nasopharyngeal swab, presence of viral mutation(s) within the areas targeted by this assay, and inadequate number of viral copies(<138 copies/mL). A  negative result must be combined with clinical observations, patient history, and epidemiological information. The expected result is Negative.  Fact Sheet for Patients:  EntrepreneurPulse.com.au  Fact Sheet for Healthcare Providers:  IncredibleEmployment.be  This test is no t yet approved or cleared by the [place] FDA and  has been authorized for detection and/or diagnosis of SARS-CoV-2 by FDA under an Emergency Use Authorization (EUA). This EUA will remain  in effect (meaning this test can be used) for the duration of the COVID-19 declaration under Section 564(b)(1) of the Act, 21 U.S.C.section 360bbb-3(b)(1), unless the authorization is terminated  or revoked sooner.       Influenza A by PCR NEGATIVE NEGATIVE Final   Influenza B by PCR NEGATIVE NEGATIVE Final    Comment: (NOTE) The Xpert Xpress SARS-CoV-2/FLU/RSV plus assay is intended as an aid in the diagnosis of influenza from Nasopharyngeal swab specimens and should not be used as a sole basis for treatment. Nasal washings and aspirates are unacceptable for Xpert Xpress SARS-CoV-2/FLU/RSV testing.  Fact Sheet for Patients: EntrepreneurPulse.com.au  Fact Sheet for Healthcare Providers: IncredibleEmployment.be  This test is not yet approved or cleared by the [place] FDA and has been authorized for detection and/or diagnosis of SARS-CoV-2 by FDA under an Emergency Use Authorization (EUA). This EUA will remain in effect (meaning this test can be used) for the duration of the COVID-19 declaration under Section 564(b)(1) of the Act, 21 U.S.C. section 360bbb-3(b)(1), unless the authorization is terminated or revoked.     Resp Syncytial Virus by PCR NEGATIVE NEGATIVE Final    Comment: (NOTE) Fact Sheet for Patients: EntrepreneurPulse.com.au  Fact Sheet for Healthcare  Providers: IncredibleEmployment.be  This test is not yet approved or cleared by the [place] FDA and has been authorized for detection and/or diagnosis of SARS-CoV-2 by FDA under an Emergency Use Authorization (EUA). This EUA will remain in effect (meaning this test can be used) for the duration of the COVID-19 declaration under Section 564(b)(1) of the Act, 21 U.S.C. section 360bbb-3(b)(1), unless the authorization is terminated or revoked.  Performed at [facility], [address] [place], [place] [address]      Labs: BNP (last 3 results) No results for input(s): "BNP" in the last 8760 hours. Basic Metabolic Panel: Recent Labs  Lab 06/20/22 0457  NA 134*  K 3.8  CL 100  CO2 24  GLUCOSE 155*  BUN 13  CREATININE 0.78  CALCIUM 9.2  MG 1.7   Liver Function Tests: No results for input(s): "AST", "ALT", "ALKPHOS", "BILITOT", "PROT", "ALBUMIN" in the last 168 hours. No results for input(s): "LIPASE", "AMYLASE" in the last 168 hours. No results for input(s): "AMMONIA" in the last 168 hours. CBC: Recent Labs  Lab 06/20/22 0457  WBC 7.7  HGB 13.9  HCT 41.9  MCV 99.8  PLT 135*   Cardiac Enzymes: No results for input(s): "CKTOTAL", "CKMB", "CKMBINDEX", "TROPONINI" in the last 168 hours. BNP: Invalid input(s): "POCBNP" CBG: Recent Labs  Lab 06/23/22 0908 06/23/22 1217 06/23/22 1620 06/23/22 1947 06/24/22 0806  GLUCAP 143* 216* 132* 176* 137*   D-Dimer No results for input(s): "DDIMER" in the last 72 hours. Hgb A1c No results for input(s): "HGBA1C" in the last 72 hours. Lipid Profile No results for input(s): "CHOL", "HDL", "LDLCALC", "TRIG", "CHOLHDL", "LDLDIRECT" in the last 72 hours. Thyroid function studies No results for input(s): "TSH", "T4TOTAL", "T3FREE", "THYROIDAB" in the last 72 hours.  Invalid input(s): "FREET3" Anemia work up No results for input(s): "VITAMINB12", "FOLATE", "FERRITIN", "TIBC", "IRON",  "RETICCTPCT" in the last 72 hours. Urinalysis    Component Value Date/Time   COLORURINE YELLOW (A) 06/17/2022 0141   APPEARANCEUR CLEAR (A) 06/17/2022 0141   LABSPEC 1.023 06/17/2022 0141   PHURINE 6.0 06/17/2022 0141   GLUCOSEU >=500 (A) 06/17/2022 0141   HGBUR NEGATIVE 06/17/2022 0141   BILIRUBINUR NEGATIVE 06/17/2022 0141   KETONESUR 5 (A) 06/17/2022 0141   PROTEINUR NEGATIVE 06/17/2022 0141   UROBILINOGEN 0.2 03/26/2013 1219   NITRITE NEGATIVE 06/17/2022 0141   LEUKOCYTESUR NEGATIVE 06/17/2022 0141   Sepsis Labs Recent Labs  Lab 06/20/22 0457  WBC 7.7   Microbiology Recent Results (from the past 240 hour(s))  Resp panel by RT-PCR (RSV, Flu A&B, Covid) Anterior Nasal Swab     Status: None   Collection Time: 06/16/22 10:08 PM   Specimen: Anterior Nasal Swab  Result Value Ref Range Status   SARS Coronavirus 2 by RT PCR NEGATIVE NEGATIVE Final    Comment: (NOTE) SARS-CoV-2 target nucleic acids are NOT DETECTED.  The SARS-CoV-2 RNA is generally detectable in upper respiratory specimens during the acute phase of infection. The lowest concentration of SARS-CoV-2 viral copies this assay can detect is 138 copies/mL. A negative result does not preclude SARS-Cov-2 infection and should not be used as the sole basis for treatment or other patient management decisions. A negative result may occur with  improper specimen collection/handling, submission of specimen other than nasopharyngeal swab, presence of viral mutation(s) within the areas targeted by this assay, and inadequate number of viral copies(<138 copies/mL). A negative result must be combined with clinical observations, patient history, and epidemiological information. The expected result is Negative.  Fact Sheet for Patients:  EntrepreneurPulse.com.au  Fact Sheet for Healthcare Providers:  IncredibleEmployment.be  This test is no t yet approved or cleared by the [place] FDA  and  has been authorized for detection and/or diagnosis of SARS-CoV-2 by FDA under an Emergency Use Authorization (EUA). This EUA will remain  in effect (meaning this test can be used) for the duration of the COVID-19 declaration under Section 564(b)(1) of the Act, 21 U.S.C.section 360bbb-3(b)(1), unless the authorization is terminated  or revoked sooner.       Influenza A by PCR NEGATIVE NEGATIVE Final   Influenza B by PCR NEGATIVE  NEGATIVE Final    Comment: (NOTE) The Xpert Xpress SARS-CoV-2/FLU/RSV plus assay is intended as an aid in the diagnosis of influenza from Nasopharyngeal swab specimens and should not be used as a sole basis for treatment. Nasal washings and aspirates are unacceptable for Xpert Xpress SARS-CoV-2/FLU/RSV testing.  Fact Sheet for Patients: EntrepreneurPulse.com.au  Fact Sheet for Healthcare Providers: IncredibleEmployment.be  This test is not yet approved or cleared by the [place] FDA and has been authorized for detection and/or diagnosis of SARS-CoV-2 by FDA under an Emergency Use Authorization (EUA). This EUA will remain in effect (meaning this test can be used) for the duration of the COVID-19 declaration under Section 564(b)(1) of the Act, 21 U.S.C. section 360bbb-3(b)(1), unless the authorization is terminated or revoked.     Resp Syncytial Virus by PCR NEGATIVE NEGATIVE Final    Comment: (NOTE) Fact Sheet for Patients: EntrepreneurPulse.com.au  Fact Sheet for Healthcare Providers: IncredibleEmployment.be  This test is not yet approved or cleared by the [place] FDA and has been authorized for detection and/or diagnosis of SARS-CoV-2 by FDA under an Emergency Use Authorization (EUA). This EUA will remain in effect (meaning this test can be used) for the duration of the COVID-19 declaration under Section 564(b)(1) of the Act, 21 U.S.C. section 360bbb-3(b)(1),  unless the authorization is terminated or revoked.  Performed at [facility], [address] [place], [place] [address]    Imaging CT HEAD WO CONTRAST (5MM)  Result Date: 06/17/2022 CLINICAL DATA:  Intracranial hemorrhage follow up EXAM: CT HEAD WITHOUT CONTRAST TECHNIQUE: Contiguous axial images were obtained from the base of the skull through the vertex without intravenous contrast. RADIATION DOSE REDUCTION: This exam was performed according to the departmental dose-optimization program which includes automated exposure control, adjustment of the mA and/or kV according to patient size and/or use of iterative reconstruction technique. COMPARISON:  06/16/2022 FINDINGS: Brain: Unchanged pattern of para falcine subdural and subarachnoid blood. No midline shift or other mass effect. There is periventricular hypoattenuation compatible with chronic microvascular disease. Vascular: No abnormal hyperdensity of the major intracranial arteries or dural venous sinuses. No intracranial atherosclerosis. Skull: The visualized skull base, calvarium and extracranial soft tissues are normal. Sinuses/Orbits: No fluid levels or advanced mucosal thickening of the visualized paranasal sinuses. No mastoid or middle ear effusion. The orbits are normal. IMPRESSION: Unchanged pattern of parafalcine subdural and subarachnoid blood. No midline shift or other mass effect. Electronically Signed   By: Ulyses Jarred M.D.   On: 06/17/2022 03:19   CT HEAD WO CONTRAST (5MM)  Result Date: 06/16/2022 CLINICAL DATA:  Multiple falls, dizziness EXAM: CT HEAD WITHOUT CONTRAST TECHNIQUE: Contiguous axial images were obtained from the base of the skull through the vertex without intravenous contrast. RADIATION DOSE REDUCTION: This exam was performed according to the departmental dose-optimization program which includes automated exposure control, adjustment of the mA and/or kV according to patient size and/or use of iterative  reconstruction technique. COMPARISON:  05/11/2022 FINDINGS: Brain: There is a falcine subdural hematoma measuring up to 4 mm at the vertex. There is associated subarachnoid hemorrhage within the sulci along the bilateral frontal and parietal convexities. No evidence of acute infarct. The lateral ventricles and remaining midline structures are unremarkable. No mass effect. Vascular: Stable atherosclerosis.  No hyperdense vessel. Skull: Normal. Negative for fracture or focal lesion. Sinuses/Orbits: No acute finding. Other: None. IMPRESSION: 1. 4 mm falcine subdural hematoma, with associated subarachnoid hemorrhage within the parafalcine sulci along the bilateral frontal and parietal convexities. 2. No  mass effect or midline shift. 3. No acute infarct. Critical Value/emergent results were called by telephone at the time of interpretation on 06/16/2022 at 8:44 pm to provider Merlyn Lot , who verbally acknowledged these results. Electronically Signed   By: Randa Ngo M.D.   On: 06/16/2022 20:45   CT Cervical Spine Wo Contrast  Result Date: 06/16/2022 CLINICAL DATA:  Multiple falls, dizziness EXAM: CT CERVICAL SPINE WITHOUT CONTRAST TECHNIQUE: Multidetector CT imaging of the cervical spine was performed without intravenous contrast. Multiplanar CT image reconstructions were also generated. RADIATION DOSE REDUCTION: This exam was performed according to the departmental dose-optimization program which includes automated exposure control, adjustment of the mA and/or kV according to patient size and/or use of iterative reconstruction technique. COMPARISON:  03/25/2022 FINDINGS: Alignment: Alignment is grossly anatomic. Skull base and vertebrae: No acute fracture. No primary bone lesion or focal pathologic process. Soft tissues and spinal canal: No prevertebral fluid or swelling. No visible canal hematoma. Disc levels: Stable mild spondylosis and right predominant facet hypertrophy at C3-4, with right greater than  left neural foraminal encroachment. Remaining disc spaces are relatively well preserved. Upper chest: Airway is patent.  Lung apices are clear. Other: Reconstructed images demonstrate no additional findings. IMPRESSION: 1. No acute cervical spine fracture. 2. 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 Electronically Signed   By: Randa Ngo M.D.   On: 06/16/2022 20:42   DG Chest 2 View  Result Date: 06/16/2022 CLINICAL DATA:  Weakness EXAM: CHEST - 2 VIEW COMPARISON:  Chest x-ray 05/11/2022 FINDINGS: Sternotomy wires and heart valve are again seen. The heart size and mediastinal contours are within normal limits. Both lungs are clear. The visualized skeletal structures are unremarkable. IMPRESSION: No active cardiopulmonary disease. Electronically Signed   By: Ronney Asters M.D.   On: 06/16/2022 19:38      Time coordinating discharge: over 30 minutes  SIGNED:  Emeterio Reeve DO [facility]

## 2022-06-24 NOTE — TOC Transition Note (Signed)
Transition of Care [facility]) - CM/SW Discharge Note   Patient Details  Name: Hector Miller MRN: [id number] Date of Birth: 04-03-1946  [facility] CM/SW Contact:  Gerilyn Pilgrim, LCSW Phone Number: 06/24/2022, 12:13 PM   Clinical Narrative:  Pt has orders in to discharge to [facility]. Dc summary sent to facility. CSW signing off.     Final next level of care: Skilled Nursing Facility Barriers to Discharge: Barriers Resolved   Patient Goals and CMS Choice CMS Medicare.gov Compare Post Acute Care list provided to:: Patient Choice offered to / list presented to : Patient  Discharge Placement                Patient chooses bed at: [facility] Patient to be transferred to facility by: [facility] transport Name of family member notified: eugene POA Patient and family notified of of transfer: 06/24/22  Discharge Plan and Services Additional resources added to the After Visit Summary for                                       Social Determinants of Health (SDOH) Interventions SDOH Screenings   Food Insecurity: No Food Insecurity (06/17/2022)  Housing: Low Risk  (06/17/2022)  Transportation Needs: No Transportation Needs (06/17/2022)  Utilities: Not At Risk (06/17/2022)  Tobacco Use: Medium Risk (06/16/2022)     Readmission Risk Interventions    05/16/2021   11:08 AM  Readmission Risk Prevention Plan  Post Dischage Appt Complete  Medication Screening Complete  Transportation Screening Complete

## 2022-06-24 NOTE — Progress Notes (Signed)
Patient ID: Hector Miller, male   DOB: [birth year], 77 y.o.   MRN: [id number]    Report called to [facility] and given to [facility] RN.  Haydee Salter, RN
